# Patient Record
Sex: Male | Born: 1935 | Race: White | Hispanic: No | Marital: Married | Smoking: Former smoker
Health system: Southern US, Community
[De-identification: ages and names within clinical notes are randomized; demographics above are authoritative.]

## PROBLEM LIST (undated history)

## (undated) DIAGNOSIS — I350 Nonrheumatic aortic (valve) stenosis: Secondary | ICD-10-CM

## (undated) DIAGNOSIS — I779 Disorder of arteries and arterioles, unspecified: Secondary | ICD-10-CM

## (undated) DIAGNOSIS — I482 Chronic atrial fibrillation, unspecified: Secondary | ICD-10-CM

## (undated) DIAGNOSIS — IMO0001 Reserved for inherently not codable concepts without codable children: Secondary | ICD-10-CM

## (undated) DIAGNOSIS — C44211 Basal cell carcinoma of skin of unspecified ear and external auricular canal: Secondary | ICD-10-CM

## (undated) DIAGNOSIS — I509 Heart failure, unspecified: Secondary | ICD-10-CM

## (undated) DIAGNOSIS — I714 Abdominal aortic aneurysm, without rupture, unspecified: Secondary | ICD-10-CM

## (undated) DIAGNOSIS — R011 Cardiac murmur, unspecified: Secondary | ICD-10-CM

## (undated) DIAGNOSIS — I4891 Unspecified atrial fibrillation: Secondary | ICD-10-CM

## (undated) DIAGNOSIS — I739 Peripheral vascular disease, unspecified: Secondary | ICD-10-CM

## (undated) DIAGNOSIS — Z5189 Encounter for other specified aftercare: Secondary | ICD-10-CM

## (undated) DIAGNOSIS — E785 Hyperlipidemia, unspecified: Secondary | ICD-10-CM

## (undated) DIAGNOSIS — C189 Malignant neoplasm of colon, unspecified: Secondary | ICD-10-CM

## (undated) DIAGNOSIS — I10 Essential (primary) hypertension: Secondary | ICD-10-CM

## (undated) DIAGNOSIS — T7840XA Allergy, unspecified, initial encounter: Secondary | ICD-10-CM

## (undated) DIAGNOSIS — Z955 Presence of coronary angioplasty implant and graft: Secondary | ICD-10-CM

## (undated) DIAGNOSIS — Z923 Personal history of irradiation: Secondary | ICD-10-CM

## (undated) DIAGNOSIS — H9193 Unspecified hearing loss, bilateral: Secondary | ICD-10-CM

## (undated) DIAGNOSIS — D509 Iron deficiency anemia, unspecified: Secondary | ICD-10-CM

## (undated) DIAGNOSIS — C801 Malignant (primary) neoplasm, unspecified: Secondary | ICD-10-CM

## (undated) DIAGNOSIS — I672 Cerebral atherosclerosis: Secondary | ICD-10-CM

## (undated) DIAGNOSIS — I2510 Atherosclerotic heart disease of native coronary artery without angina pectoris: Secondary | ICD-10-CM

## (undated) DIAGNOSIS — I080 Rheumatic disorders of both mitral and aortic valves: Secondary | ICD-10-CM

## (undated) DIAGNOSIS — I219 Acute myocardial infarction, unspecified: Secondary | ICD-10-CM

## (undated) HISTORY — DX: Hyperlipidemia, unspecified: E78.5

## (undated) HISTORY — DX: Peripheral vascular disease, unspecified: I73.9

## (undated) HISTORY — DX: Encounter for other specified aftercare: Z51.89

## (undated) HISTORY — DX: Essential (primary) hypertension: I10

## (undated) HISTORY — DX: Chronic atrial fibrillation, unspecified: I48.20

## (undated) HISTORY — DX: Unspecified hearing loss, bilateral: H91.93

## (undated) HISTORY — DX: Nonrheumatic aortic (valve) stenosis: I35.0

## (undated) HISTORY — PX: OTHER SURGICAL HISTORY: SHX169

## (undated) HISTORY — DX: Acute myocardial infarction, unspecified: I21.9

## (undated) HISTORY — DX: Abdominal aortic aneurysm, without rupture: I71.4

## (undated) HISTORY — DX: Reserved for inherently not codable concepts without codable children: IMO0001

## (undated) HISTORY — DX: Atherosclerotic heart disease of native coronary artery without angina pectoris: I25.10

## (undated) HISTORY — DX: Malignant (primary) neoplasm, unspecified: C80.1

## (undated) HISTORY — PX: COLON SURGERY: SHX602

## (undated) HISTORY — DX: Presence of coronary angioplasty implant and graft: Z95.5

## (undated) HISTORY — DX: Abdominal aortic aneurysm, without rupture, unspecified: I71.40

## (undated) HISTORY — DX: Cardiac murmur, unspecified: R01.1

## (undated) HISTORY — DX: Unspecified atrial fibrillation: I48.91

## (undated) HISTORY — DX: Cerebral atherosclerosis: I67.2

## (undated) HISTORY — DX: Rheumatic disorders of both mitral and aortic valves: I08.0

## (undated) HISTORY — DX: Disorder of arteries and arterioles, unspecified: I77.9

## (undated) HISTORY — DX: Personal history of irradiation: Z92.3

## (undated) HISTORY — DX: Iron deficiency anemia, unspecified: D50.9

## (undated) HISTORY — DX: Basal cell carcinoma of skin of unspecified ear and external auricular canal: C44.211

---

## 1991-08-17 HISTORY — PX: CARDIAC CATHETERIZATION: SHX172

## 1992-03-29 HISTORY — PX: CARDIAC CATHETERIZATION: SHX172

## 1998-06-02 ENCOUNTER — Ambulatory Visit: Admission: RE | Admit: 1998-06-02 | Discharge: 1998-06-02 | Payer: Self-pay | Admitting: Cardiovascular Disease

## 2000-11-26 HISTORY — PX: CORONARY ARTERY BYPASS GRAFT: SHX141

## 2001-03-11 ENCOUNTER — Encounter: Payer: Self-pay | Admitting: Cardiovascular Disease

## 2001-03-11 HISTORY — PX: CARDIAC CATHETERIZATION: SHX172

## 2001-03-12 ENCOUNTER — Inpatient Hospital Stay (HOSPITAL_COMMUNITY): Admission: RE | Admit: 2001-03-12 | Discharge: 2001-03-20 | Payer: Self-pay | Admitting: Cardiovascular Disease

## 2001-03-14 ENCOUNTER — Encounter: Payer: Self-pay | Admitting: Thoracic Surgery (Cardiothoracic Vascular Surgery)

## 2001-03-15 ENCOUNTER — Encounter: Payer: Self-pay | Admitting: Thoracic Surgery (Cardiothoracic Vascular Surgery)

## 2001-03-16 ENCOUNTER — Encounter: Payer: Self-pay | Admitting: Surgery

## 2001-04-07 ENCOUNTER — Encounter: Payer: Self-pay | Admitting: Cardiovascular Disease

## 2001-04-07 ENCOUNTER — Ambulatory Visit (HOSPITAL_COMMUNITY): Admission: RE | Admit: 2001-04-07 | Discharge: 2001-04-07 | Payer: Self-pay | Admitting: Cardiovascular Disease

## 2001-04-22 ENCOUNTER — Encounter (HOSPITAL_COMMUNITY): Admission: RE | Admit: 2001-04-22 | Discharge: 2001-05-22 | Payer: Self-pay | Admitting: Cardiovascular Disease

## 2001-05-23 ENCOUNTER — Encounter (HOSPITAL_COMMUNITY): Admission: RE | Admit: 2001-05-23 | Discharge: 2001-06-22 | Payer: Self-pay | Admitting: Cardiovascular Disease

## 2001-06-23 ENCOUNTER — Encounter (HOSPITAL_COMMUNITY): Admission: RE | Admit: 2001-06-23 | Discharge: 2001-07-23 | Payer: Self-pay | Admitting: Cardiovascular Disease

## 2002-03-10 ENCOUNTER — Encounter: Payer: Self-pay | Admitting: Cardiovascular Disease

## 2002-03-10 ENCOUNTER — Ambulatory Visit (HOSPITAL_COMMUNITY): Admission: RE | Admit: 2002-03-10 | Discharge: 2002-03-10 | Payer: Self-pay | Admitting: Cardiovascular Disease

## 2002-05-27 ENCOUNTER — Encounter: Payer: Self-pay | Admitting: Cardiovascular Disease

## 2002-05-27 ENCOUNTER — Ambulatory Visit (HOSPITAL_COMMUNITY): Admission: RE | Admit: 2002-05-27 | Discharge: 2002-05-27 | Payer: Self-pay | Admitting: Cardiovascular Disease

## 2002-05-27 HISTORY — PX: OTHER SURGICAL HISTORY: SHX169

## 2003-11-27 HISTORY — PX: OTHER SURGICAL HISTORY: SHX169

## 2004-03-15 ENCOUNTER — Emergency Department (HOSPITAL_COMMUNITY): Admission: EM | Admit: 2004-03-15 | Discharge: 2004-03-15 | Payer: Self-pay | Admitting: Emergency Medicine

## 2004-03-24 ENCOUNTER — Ambulatory Visit (HOSPITAL_COMMUNITY): Admission: RE | Admit: 2004-03-24 | Discharge: 2004-03-24 | Payer: Self-pay | Admitting: Family Medicine

## 2004-04-07 ENCOUNTER — Ambulatory Visit (HOSPITAL_COMMUNITY): Admission: RE | Admit: 2004-04-07 | Discharge: 2004-04-07 | Payer: Self-pay | Admitting: Family Medicine

## 2004-08-18 ENCOUNTER — Ambulatory Visit (HOSPITAL_COMMUNITY): Admission: RE | Admit: 2004-08-18 | Discharge: 2004-08-18 | Payer: Self-pay | Admitting: General Surgery

## 2004-08-21 ENCOUNTER — Inpatient Hospital Stay (HOSPITAL_COMMUNITY): Admission: EM | Admit: 2004-08-21 | Discharge: 2004-08-23 | Payer: Self-pay | Admitting: Emergency Medicine

## 2004-08-21 ENCOUNTER — Ambulatory Visit: Payer: Self-pay | Admitting: Internal Medicine

## 2004-08-22 ENCOUNTER — Encounter (INDEPENDENT_AMBULATORY_CARE_PROVIDER_SITE_OTHER): Payer: Self-pay | Admitting: Cardiology

## 2004-08-28 ENCOUNTER — Ambulatory Visit: Payer: Self-pay | Admitting: Internal Medicine

## 2004-09-18 ENCOUNTER — Inpatient Hospital Stay (HOSPITAL_COMMUNITY): Admission: RE | Admit: 2004-09-18 | Discharge: 2004-09-21 | Payer: Self-pay | Admitting: Cardiovascular Disease

## 2004-11-26 HISTORY — PX: CAROTID ENDARTERECTOMY: SUR193

## 2004-11-26 HISTORY — PX: OTHER SURGICAL HISTORY: SHX169

## 2004-11-28 ENCOUNTER — Ambulatory Visit: Payer: Self-pay | Admitting: Oncology

## 2004-12-05 ENCOUNTER — Ambulatory Visit (HOSPITAL_COMMUNITY): Admission: RE | Admit: 2004-12-05 | Discharge: 2004-12-05 | Payer: Self-pay | Admitting: Internal Medicine

## 2004-12-05 ENCOUNTER — Ambulatory Visit: Payer: Self-pay | Admitting: Gastroenterology

## 2005-01-24 HISTORY — PX: COLONOSCOPY: SHX174

## 2005-01-24 HISTORY — PX: ESOPHAGOGASTRODUODENOSCOPY: SHX1529

## 2005-01-31 ENCOUNTER — Ambulatory Visit: Payer: Self-pay | Admitting: Gastroenterology

## 2005-01-31 ENCOUNTER — Inpatient Hospital Stay (HOSPITAL_COMMUNITY): Admission: EM | Admit: 2005-01-31 | Discharge: 2005-02-01 | Payer: Self-pay | Admitting: Emergency Medicine

## 2005-02-21 ENCOUNTER — Inpatient Hospital Stay (HOSPITAL_COMMUNITY): Admission: EM | Admit: 2005-02-21 | Discharge: 2005-02-24 | Payer: Self-pay | Admitting: Emergency Medicine

## 2005-04-09 ENCOUNTER — Ambulatory Visit (HOSPITAL_COMMUNITY): Admission: RE | Admit: 2005-04-09 | Discharge: 2005-04-10 | Payer: Self-pay | Admitting: Cardiovascular Disease

## 2005-05-17 ENCOUNTER — Encounter (INDEPENDENT_AMBULATORY_CARE_PROVIDER_SITE_OTHER): Payer: Self-pay | Admitting: Specialist

## 2005-05-17 ENCOUNTER — Inpatient Hospital Stay (HOSPITAL_COMMUNITY): Admission: RE | Admit: 2005-05-17 | Discharge: 2005-05-18 | Payer: Self-pay | Admitting: *Deleted

## 2009-06-27 ENCOUNTER — Encounter (INDEPENDENT_AMBULATORY_CARE_PROVIDER_SITE_OTHER): Payer: Self-pay | Admitting: Ophthalmology

## 2009-06-27 ENCOUNTER — Ambulatory Visit (HOSPITAL_COMMUNITY): Admission: RE | Admit: 2009-06-27 | Discharge: 2009-06-27 | Payer: Self-pay | Admitting: Ophthalmology

## 2009-10-07 ENCOUNTER — Encounter: Payer: Self-pay | Admitting: Internal Medicine

## 2010-02-13 HISTORY — PX: CARDIOVASCULAR STRESS TEST: SHX262

## 2011-02-07 ENCOUNTER — Encounter: Payer: Self-pay | Admitting: Gastroenterology

## 2011-02-07 ENCOUNTER — Ambulatory Visit (INDEPENDENT_AMBULATORY_CARE_PROVIDER_SITE_OTHER): Payer: Medicare Other | Admitting: Gastroenterology

## 2011-02-07 DIAGNOSIS — K5900 Constipation, unspecified: Secondary | ICD-10-CM | POA: Insufficient documentation

## 2011-02-07 DIAGNOSIS — Z862 Personal history of diseases of the blood and blood-forming organs and certain disorders involving the immune mechanism: Secondary | ICD-10-CM

## 2011-02-09 ENCOUNTER — Encounter: Payer: Self-pay | Admitting: Gastroenterology

## 2011-02-13 NOTE — Letter (Signed)
Summary: [facility]  [facility]   Imported By: Rexene Alberts 02/09/2011 10:00:30  _____________________________________________________________________  External Attachment:    Type:   Image     Comment:   External Document

## 2011-02-13 NOTE — Assessment & Plan Note (Signed)
Summary: ABD PAIN/GAS & BLOATING/LAW   Vital Signs:  Patient profile:   77 year old male Height:      68 inches Weight:      144 pounds BMI:     21.97 Temp:     98.3 degrees F oral Pulse rate:   88 / minute Pulse rhythm:   irregular BP sitting:   100 / 58  (left arm)  Vitals Entered By: Carolan Clines LPN (February 07, 2011 10:46 AM)  Visit Type:  f/u Primary Care Provider:  [facility]  Chief Complaint:  constipation.  History of Present Illness: Mr. Friesen is a pleasant 77 y/o WM who presents for evaluation of constipation. He states he has increased constipation every fall when the weather gets cool. He may go 3-4 days at a time without a BM. He called to make appt yesterday b/c of abd bloating/pain/obstipation. Took pediatric gylcerin suppository at 5pm yeasterday and at 2:30am he had large volume BMs. No brbpr. Stools are black on chronic iron. Pain resolved after BM. He has three episodes this winter. Denies rectal pain. Has used hemorrhoid suppositories in past. Symptoms this time seemed to start after falling three weeks ago. Seen by [facility]. Had even monitor done. Labs done 01/23/11 showed H/H 12.2/38.6.   Last required prbc and iron infusion 2006. See PMH.     Current Medications (verified): 1)  Zetia 10 Mg Tabs (Ezetimibe) .... Take One Once Daily 2)  Hydrochlorothiazide 25 Mg Tabs (Hydrochlorothiazide) .... Take One Once Daily 3)  Isosorbide Mononitrate Cr 60 Mg Xr24h-Tab (Isosorbide Mononitrate) .... Take One Half Once Daily 4)  Lipitor 40 Mg Tabs (Atorvastatin Calcium) .... Take One Once Daily 5)  Plavix 75 Mg Tabs (Clopidogrel Bisulfate) .... Take One Once Daily 6)  Niaspan 500 Mg Cr-Tabs (Niacin (Antihyperlipidemic)) .... Take Three Once Daily At Bed 7)  Ferrous Sulfate 325 (65 Fe) Mg Tabs (Ferrous Sulfate) .... Take One Three Times A Day 8)  Prevacid 15 Mg Cpdr (Lansoprazole) .... Take One Once Daily 9)  Tenormin 50 Mg Tabs (Atenolol) ....  Take One Once Daily 10)  Multivitamins  Tabs (Multiple Vitamin) .... Take One Once Daily 11)  Pedia-Lax 1 Gm Supp (Glycerin (Laxative)) .... Use One Prn  Allergies (verified): 1)  ! Lopressor  Past History:  Past Medical History: *SB capsule endoscopy 1/06, ?nonbleeding leiomyomas in distal small bowel.   *TCS with TI by Dr. Jena Gauss 3/06 for IDA, recurrent significant GI bleeding on Plavix. Int hemorrhoids, normal colon and TI, felt to have bled from occult AVMs and discontinuation of Plavix/ASA advised at that time due to risk/benefits ratio.  *EGD with enteroscopy for abnormal SB capsule endoscopy in setting of IDA/Plavix/ASA, 3/06-->normal through to proximal jejunum. CAD PAD Moderate aortic stenosis Hyperlipidemia Small AAA, followed by Dr. [name] Afib, not candidate for Coumadin    Past Surgical History: Skin cancer CABG 2002 Right CEA, 2006 Stent RCA ostium, 2005  Family History: No FH of chronic GI illnesses, CRC, liver disease.  Social History: Married. No children. Quit smoking, remotely. No alcohol, drug use.  Review of Systems General:  Denies fever, chills, sweats, anorexia, fatigue, weakness, and weight loss. Eyes:  Denies vision loss. ENT:  Denies nasal congestion, sore throat, hoarseness, and difficulty swallowing. CV:  Complains of dyspnea on exertion and peripheral edema; denies chest pains, angina, and palpitations. Resp:  Complains of dyspnea with exercise; denies dyspnea at rest, cough, sputum, and wheezing. GI:  See HPI. GU:  Denies urinary  burning and blood in urine. MS:  Denies joint pain / LOM and low back pain. Derm:  Denies rash and itching. Neuro:  Denies weakness, frequent headaches, memory loss, and confusion. Psych:  Denies depression and anxiety. Endo:  Denies unusual weight change. Heme:  Denies bruising and bleeding. Allergy:  Denies hives and rash.  Physical Exam  General:  Well developed, well nourished, no acute distress.  Elderly. Head:  Normocephalic and atraumatic. Eyes:    no scleral icterus.  Mouth:  Oropharyngeal mucosa moist, pink.  No lesions, erythema or exudate.    Neck:  Supple; no masses or thyromegaly. Lungs:  Clear throughout to auscultation. Heart:  irregular rhythm:.   Abdomen:  Bowel sounds normal.  Abdomen is soft, nontender, nondistended.  No rebound or guarding.  No hepatosplenomegaly, masses or hernias.  No abdominal bruits.  Extremities:  trace pedal edema.   Neurologic:  Alert and  oriented x4;  grossly normal neurologically. Skin:  Intact without significant lesions or rashes. Cervical Nodes:  No significant cervical adenopathy. Psych:  Alert and cooperative. Normal mood and affect.   Impression & Recommendations:  Problem # 1:  CONSTIPATION, INTERMITTENT (ICD-564.00)  Intermittent constipation without alarm symptoms. Last TCS 2006. Begin Miralax. OV in 3 months.  Orders: New Patient Level IV (99204)Future Orders: T-CBC w/Diff ([id number]) ... 04/02/2011  Problem # 2:  IRON DEFICIENCY ANEMIA, HX OF (ICD-V12.3)  Tried to get previous h/h from PCP or [facility] for comparison but have been unsuccessful. Overall Hgb near normal. As long as H/H good he can wait for TCS until 2016, health permitting. Would advise monitoring H/H given h/o occult GI bleeding on Plavix/ASA.  Orders: New Patient Level IV (99204)Future Orders: T-CBC w/Diff ([id number]) ... 04/02/2011  Patient Instructions: 1)  Begin Miralax one capful daily as needed for constipation. Take daily for one week to establish regular bowel movements and then you can decrease to 2-3 times per week.  2)  We will review your labs when available and call with any further instruction. 3)  Please schedule a follow-up appointment in 3 months. 4)  The medication list was reviewed and reconciled.  All changed / newly prescribed medications were explained.  A complete medication list was provided to the patient /  caregiver. Prescriptions: POLYETHYLENE GLYCOL 3350  POWD (POLYETHYLENE GLYCOL 3350) 17 grams by mouth daily as needed constipation  #527g x 5   Entered and Authorized by:   Leanna Battles. Dixon Boos   Signed by:   Leanna Battles TRUE Garciamartinez PA-C on 02/07/2011   Method used:   Print then Give to Patient   RxID:   [id number]    Orders Added: 1)  New Patient Level IV [99204] 2)  T-CBC w/Diff [[id number]]  Appended Document: ABD PAIN/GAS & BLOATING/LAW 3 month f/u opv is in the computer

## 2011-02-28 ENCOUNTER — Telehealth: Payer: Self-pay

## 2011-02-28 NOTE — Telephone Encounter (Signed)
pts wife aware, she stated she wants to try the colace and will call back to reschedule ov.

## 2011-02-28 NOTE — Telephone Encounter (Signed)
Patient can hold MiraLax for now. Try Colace 1-2 tablets at bedtime. Stomach burning is a new problem. Recommend office visit now.

## 2011-02-28 NOTE — Telephone Encounter (Signed)
pts wife called- was seen by [facility] on 02/07/11 and given rx for miralax. Pt has been taking qhs and having a lot of abd cramping that is keeping him up all night. Wife stated he was having good BMs. Pt is also complaining of his stomach burning all the time now. He is on prevacid and plavix. Wife stated she was having a hard time trying to get him to eat now. Pt has appt 05/11/11 and they want to know if there is anything we can call in to help him until his ov or does he need to come in sooner? Pt uses [facility]. Please advise.

## 2011-03-04 LAB — HEMOGLOBIN AND HEMATOCRIT, BLOOD: HCT: 37.4 % — ABNORMAL LOW (ref 39.0–52.0)

## 2011-03-04 LAB — BASIC METABOLIC PANEL
Calcium: 9.7 mg/dL (ref 8.4–10.5)
Creatinine, Ser: 0.62 mg/dL (ref 0.4–1.5)
Potassium: 4.4 mEq/L (ref 3.5–5.1)
Sodium: 139 mEq/L (ref 135–145)

## 2011-03-13 ENCOUNTER — Ambulatory Visit (INDEPENDENT_AMBULATORY_CARE_PROVIDER_SITE_OTHER): Payer: Medicare Other | Admitting: Gastroenterology

## 2011-03-13 ENCOUNTER — Encounter: Payer: Self-pay | Admitting: Gastroenterology

## 2011-03-13 DIAGNOSIS — R634 Abnormal weight loss: Secondary | ICD-10-CM | POA: Insufficient documentation

## 2011-03-13 DIAGNOSIS — R630 Anorexia: Secondary | ICD-10-CM | POA: Insufficient documentation

## 2011-03-13 DIAGNOSIS — R109 Unspecified abdominal pain: Secondary | ICD-10-CM | POA: Insufficient documentation

## 2011-03-13 DIAGNOSIS — Z862 Personal history of diseases of the blood and blood-forming organs and certain disorders involving the immune mechanism: Secondary | ICD-10-CM

## 2011-03-13 DIAGNOSIS — K5900 Constipation, unspecified: Secondary | ICD-10-CM

## 2011-03-13 NOTE — Progress Notes (Signed)
Primary Care Physician: [name]., MD  Primary Gastroenterologist:  Dr. Roetta Sessions  Chief Complaint  Patient presents with  . Abdominal Pain    HPI: Charles Faulkner is a 77 y.o. male here for further evaluation of abdominal pain. He was seen last month with complaints of constipation. Since last office visit he started having abdominal pain. His bowel movements have been daily now that he is on Colace 200 mg at bedtime. He complains of abdominal cramping and gas. Not associated with bowel movements. He is single episode of vomiting. Complains of anorexia. He's lost 2 pounds in the past month. Denies any heartburn or indigestion. No dysphagia. No melena or rectal bleeding. Denies fever or chills. According to his wife, he feels terrible and want do anything like he used to.  *SB capsule endoscopy 1/06, ?nonbleeding leiomyomas in distal small bowel.   *TCS with TI by Dr. Jena Gauss 3/06 for IDA, recurrent significant GI bleeding on Plavix. Int hemorrhoids, normal colon and TI, felt to have bled from occult AVMs and discontinuation of Plavix/ASA advised at that time due to risk/benefits ratio.  *EGD with enteroscopy for abnormal SB capsule endoscopy in setting of IDA/Plavix/ASA, 3/06-->normal through to proximal jejunum.  Last hemoglobin in the 12 range a couple months ago.  Current Outpatient Prescriptions  Medication Sig Dispense Refill  . clopidogrel (PLAVIX) 75 MG tablet Take 75 mg by mouth daily.        Marland Kitchen docusate sodium (COLACE) 100 MG capsule Take 100 mg by mouth daily.        . ferrous sulfate 325 (65 FE) MG tablet Take 325 mg by mouth daily with breakfast.        . hydrochlorothiazide 25 MG tablet Take 0.5 tablets by mouth daily.      . lansoprazole (PREVACID) 15 MG capsule Take 15 mg by mouth daily.        Marland Kitchen LIPITOR 40 MG tablet Take 1 tablet by mouth daily.      . Multiple Vitamins-Minerals ([facility] SPECTRAVITE SENIOR PO) Take 1 tablet by mouth daily.        . niacin (NIASPAN) 500  MG CR tablet Take 500 mg by mouth at bedtime.        . TENORMIN 50 MG tablet Take 1 tablet by mouth daily.      Marland Kitchen ZETIA 10 MG tablet Take 1 tablet by mouth daily.        Allergies as of 03/13/2011 - Review Complete 03/13/2011  Allergen Reaction Noted  . Metoprolol tartrate  02/07/2011    ROS:  General: Negative for fever, chills. C/O weakness. See HPI. ENT: Negative for hoarseness, difficulty swallowing , nasal congestion. CV: Negative for chest pain, angina, palpitations, dyspnea on exertion, peripheral edema.  Respiratory: Negative for dyspnea at rest, dyspnea on exertion, cough, sputum, wheezing.  GI: See history of present illness. GU:  Negative for dysuria, hematuria, urinary incontinence, urinary frequency, nocturnal urination.  Endo: Negative for unusual weight change.    Physical Examination:   BP 104/64  Pulse 98  Temp 98.9 F (37.2 C)  Ht 5\' 4"  (1.626 m)  Wt 142 lb 6.4 oz (64.592 kg)  BMI 24.44 kg/m2  General: Well-nourished, well-developed in no acute distress. Extreme hard of hearing. Eyes: No icterus. Mouth: Oropharyngeal mucosa moist and pink , no lesions erythema or exudate. Lungs: Clear to auscultation bilaterally.  Heart: Regular rate and rhythm, no murmurs rubs or gallops.  Abdomen: Bowel sounds are normal, diffuse mild tenderness, nondistended, no  hepatosplenomegaly or masses, no abdominal bruits or hernia , no rebound or guarding.   Extremities: No lower extremity edema.  Neuro: Alert and oriented x 4   Skin: Warm and dry, no jaundice.   Psych: Alert and cooperative, normal mood and affect.

## 2011-03-13 NOTE — Telephone Encounter (Signed)
Pt had OV on 03/13/11

## 2011-03-14 ENCOUNTER — Encounter: Payer: Self-pay | Admitting: Gastroenterology

## 2011-03-14 ENCOUNTER — Emergency Department (HOSPITAL_COMMUNITY): Payer: Medicare Other

## 2011-03-14 ENCOUNTER — Ambulatory Visit (HOSPITAL_COMMUNITY)
Admission: RE | Admit: 2011-03-14 | Discharge: 2011-03-14 | Disposition: A | Discharge status: Home / Self Care | Payer: Medicare Other | Source: Ambulatory Visit | Attending: Gastroenterology | Admitting: Gastroenterology

## 2011-03-14 ENCOUNTER — Inpatient Hospital Stay (HOSPITAL_COMMUNITY)
Admission: EM | Admit: 2011-03-14 | Discharge: 2011-03-16 | Disposition: A | Discharge status: Home Health | Payer: Medicare Other | Source: Home / Self Care | Attending: General Surgery | Admitting: General Surgery

## 2011-03-14 DIAGNOSIS — I4891 Unspecified atrial fibrillation: Secondary | ICD-10-CM | POA: Diagnosis present

## 2011-03-14 DIAGNOSIS — K639 Disease of intestine, unspecified: Secondary | ICD-10-CM | POA: Insufficient documentation

## 2011-03-14 DIAGNOSIS — Y836 Removal of other organ (partial) (total) as the cause of abnormal reaction of the patient, or of later complication, without mention of misadventure at the time of the procedure: Secondary | ICD-10-CM | POA: Diagnosis not present

## 2011-03-14 DIAGNOSIS — R188 Other ascites: Secondary | ICD-10-CM | POA: Insufficient documentation

## 2011-03-14 DIAGNOSIS — I509 Heart failure, unspecified: Secondary | ICD-10-CM | POA: Diagnosis present

## 2011-03-14 DIAGNOSIS — I714 Abdominal aortic aneurysm, without rupture, unspecified: Secondary | ICD-10-CM | POA: Diagnosis present

## 2011-03-14 DIAGNOSIS — I5031 Acute diastolic (congestive) heart failure: Secondary | ICD-10-CM | POA: Diagnosis not present

## 2011-03-14 DIAGNOSIS — K631 Perforation of intestine (nontraumatic): Secondary | ICD-10-CM | POA: Diagnosis present

## 2011-03-14 DIAGNOSIS — E785 Hyperlipidemia, unspecified: Secondary | ICD-10-CM | POA: Diagnosis present

## 2011-03-14 DIAGNOSIS — D72829 Elevated white blood cell count, unspecified: Secondary | ICD-10-CM | POA: Diagnosis present

## 2011-03-14 DIAGNOSIS — C189 Malignant neoplasm of colon, unspecified: Secondary | ICD-10-CM | POA: Diagnosis present

## 2011-03-14 DIAGNOSIS — Z7902 Long term (current) use of antithrombotics/antiplatelets: Secondary | ICD-10-CM

## 2011-03-14 DIAGNOSIS — R630 Anorexia: Secondary | ICD-10-CM

## 2011-03-14 DIAGNOSIS — Z79899 Other long term (current) drug therapy: Secondary | ICD-10-CM

## 2011-03-14 DIAGNOSIS — R634 Abnormal weight loss: Secondary | ICD-10-CM | POA: Insufficient documentation

## 2011-03-14 DIAGNOSIS — I2510 Atherosclerotic heart disease of native coronary artery without angina pectoris: Secondary | ICD-10-CM | POA: Diagnosis present

## 2011-03-14 DIAGNOSIS — D649 Anemia, unspecified: Secondary | ICD-10-CM | POA: Diagnosis present

## 2011-03-14 DIAGNOSIS — I10 Essential (primary) hypertension: Secondary | ICD-10-CM | POA: Diagnosis present

## 2011-03-14 DIAGNOSIS — R109 Unspecified abdominal pain: Secondary | ICD-10-CM | POA: Insufficient documentation

## 2011-03-14 DIAGNOSIS — R5381 Other malaise: Secondary | ICD-10-CM | POA: Diagnosis not present

## 2011-03-14 DIAGNOSIS — C772 Secondary and unspecified malignant neoplasm of intra-abdominal lymph nodes: Secondary | ICD-10-CM | POA: Diagnosis present

## 2011-03-14 DIAGNOSIS — I359 Nonrheumatic aortic valve disorder, unspecified: Secondary | ICD-10-CM | POA: Diagnosis present

## 2011-03-14 DIAGNOSIS — Z951 Presence of aortocoronary bypass graft: Secondary | ICD-10-CM

## 2011-03-14 DIAGNOSIS — J449 Chronic obstructive pulmonary disease, unspecified: Secondary | ICD-10-CM | POA: Diagnosis present

## 2011-03-14 DIAGNOSIS — J4489 Other specified chronic obstructive pulmonary disease: Secondary | ICD-10-CM | POA: Diagnosis present

## 2011-03-14 DIAGNOSIS — R935 Abnormal findings on diagnostic imaging of other abdominal regions, including retroperitoneum: Secondary | ICD-10-CM | POA: Insufficient documentation

## 2011-03-14 DIAGNOSIS — Y9210 Unspecified residential institution as the place of occurrence of the external cause: Secondary | ICD-10-CM | POA: Diagnosis not present

## 2011-03-14 DIAGNOSIS — I6529 Occlusion and stenosis of unspecified carotid artery: Secondary | ICD-10-CM | POA: Diagnosis present

## 2011-03-14 DIAGNOSIS — Z87891 Personal history of nicotine dependence: Secondary | ICD-10-CM

## 2011-03-14 DIAGNOSIS — K869 Disease of pancreas, unspecified: Secondary | ICD-10-CM | POA: Insufficient documentation

## 2011-03-14 DIAGNOSIS — T8140XA Infection following a procedure, unspecified, initial encounter: Secondary | ICD-10-CM | POA: Diagnosis not present

## 2011-03-14 DIAGNOSIS — I739 Peripheral vascular disease, unspecified: Secondary | ICD-10-CM | POA: Diagnosis present

## 2011-03-14 DIAGNOSIS — Z9861 Coronary angioplasty status: Secondary | ICD-10-CM

## 2011-03-14 DIAGNOSIS — F29 Unspecified psychosis not due to a substance or known physiological condition: Secondary | ICD-10-CM | POA: Diagnosis not present

## 2011-03-14 DIAGNOSIS — C180 Malignant neoplasm of cecum: Principal | ICD-10-CM | POA: Diagnosis present

## 2011-03-14 DIAGNOSIS — C786 Secondary malignant neoplasm of retroperitoneum and peritoneum: Secondary | ICD-10-CM | POA: Diagnosis present

## 2011-03-14 DIAGNOSIS — F05 Delirium due to known physiological condition: Secondary | ICD-10-CM | POA: Diagnosis not present

## 2011-03-14 DIAGNOSIS — Z832 Family history of diseases of the blood and blood-forming organs and certain disorders involving the immune mechanism: Secondary | ICD-10-CM

## 2011-03-14 LAB — DIFFERENTIAL
Eosinophils Relative: 1 % (ref 0–5)
Lymphs Abs: 1.1 10*3/uL (ref 0.7–4.0)
Monocytes Absolute: 1 10*3/uL (ref 0.1–1.0)
Monocytes Relative: 9 % (ref 3–12)
Neutro Abs: 9.4 10*3/uL — ABNORMAL HIGH (ref 1.7–7.7)

## 2011-03-14 LAB — CBC WITH DIFFERENTIAL/PLATELET
Basophils Relative: 0 % (ref 0–1)
HCT: 40.2 % (ref 39.0–52.0)
MCH: 30.9 pg (ref 26.0–34.0)
MCV: 96.4 fL (ref 78.0–100.0)
Monocytes Absolute: 1 10*3/uL (ref 0.1–1.0)
Monocytes Relative: 10 % (ref 3–12)
Neutrophils Relative %: 76 % (ref 43–77)
RBC: 4.17 MIL/uL — ABNORMAL LOW (ref 4.22–5.81)

## 2011-03-14 LAB — COMPREHENSIVE METABOLIC PANEL
Albumin: 2.4 g/dL — ABNORMAL LOW (ref 3.5–5.2)
Alkaline Phosphatase: 84 U/L (ref 39–117)
Alkaline Phosphatase: 86 U/L (ref 39–117)
BUN: 11 mg/dL (ref 6–23)
CO2: 24 mEq/L (ref 19–32)
Calcium: 9 mg/dL (ref 8.4–10.5)
Chloride: 102 mEq/L (ref 96–112)
Creat: 0.7 mg/dL (ref 0.40–1.50)
Creatinine, Ser: 0.93 mg/dL (ref 0.4–1.5)
Glucose, Bld: 171 mg/dL — ABNORMAL HIGH (ref 70–99)
Sodium: 141 mEq/L (ref 135–145)
Total Bilirubin: 0.5 mg/dL (ref 0.3–1.2)
Total Protein: 5.9 g/dL — ABNORMAL LOW (ref 6.0–8.3)

## 2011-03-14 LAB — CBC
Hemoglobin: 12.5 g/dL — ABNORMAL LOW (ref 13.0–17.0)
MCHC: 33.4 g/dL (ref 30.0–36.0)
MCV: 94 fL (ref 78.0–100.0)
Platelets: 272 10*3/uL (ref 150–400)
RDW: 15.5 % (ref 11.5–15.5)
WBC: 11.6 10*3/uL — ABNORMAL HIGH (ref 4.0–10.5)

## 2011-03-14 MED ORDER — IOHEXOL 300 MG/ML  SOLN
100.0000 mL | Freq: Once | INTRAMUSCULAR | Status: AC | PRN
  Administered 2011-03-14: 100 mL via INTRAVENOUS

## 2011-03-14 NOTE — Assessment & Plan Note (Signed)
Improved on Colace.

## 2011-03-14 NOTE — Assessment & Plan Note (Signed)
Refer to abdominal pain assessment and plan.

## 2011-03-14 NOTE — Assessment & Plan Note (Signed)
Refer to abdominal pain assessment and plan. 

## 2011-03-14 NOTE — Assessment & Plan Note (Signed)
New onset abdominal pain. He has developed this since his last office visit. The only change in medications that we made with the added Colace 200 mg at bedtime. This has resulted in regular bowel movements now. Abdominal pain is associated with anorexia, 2 pound weight loss, episode of vomiting. Etiology unclear. Doubt it is related to Colace. Pain is located throughout the lower to mid abdomen. Obtain CT abdomen and pelvis with IV and oral contrast. Check labs.

## 2011-03-15 LAB — MAGNESIUM: Magnesium: 2 mg/dL (ref 1.5–2.5)

## 2011-03-15 LAB — BASIC METABOLIC PANEL
BUN: 7 mg/dL (ref 6–23)
CO2: 27 mEq/L (ref 19–32)
Creatinine, Ser: 0.66 mg/dL (ref 0.4–1.5)
GFR calc Af Amer: >60 mL/min (ref >60)
Glucose, Bld: 148 mg/dL — ABNORMAL HIGH (ref 70–99)
Potassium: 4 mEq/L (ref 3.5–5.1)
Sodium: 140 mEq/L (ref 135–145)

## 2011-03-15 LAB — DIFFERENTIAL
Eosinophils Absolute: 0.1 10*3/uL (ref 0.0–0.7)
Eosinophils Relative: 1 % (ref 0–5)

## 2011-03-15 LAB — CBC
HCT: 40 % (ref 39.0–52.0)
Hemoglobin: 13.1 g/dL (ref 13.0–17.0)
MCV: 95 fL (ref 78.0–100.0)
RDW: 15.5 % (ref 11.5–15.5)
WBC: 9.3 10*3/uL (ref 4.0–10.5)

## 2011-03-15 LAB — PHOSPHORUS: Phosphorus: 2.2 mg/dL — ABNORMAL LOW (ref 2.3–4.6)

## 2011-03-15 LAB — CEA: CEA: 4.4 ng/mL (ref 0.0–5.0)

## 2011-03-15 NOTE — Progress Notes (Signed)
Cc to Dr. Fusco 

## 2011-03-16 ENCOUNTER — Inpatient Hospital Stay (HOSPITAL_COMMUNITY): Payer: Medicare Other

## 2011-03-16 ENCOUNTER — Inpatient Hospital Stay (HOSPITAL_COMMUNITY)
Admission: AD | Admit: 2011-03-16 | Discharge: 2011-04-02 | DRG: 329 | Disposition: A | Discharge status: Home Health | Payer: Medicare Other | Source: Other Acute Inpatient Hospital | Attending: Surgery | Admitting: Surgery

## 2011-03-16 LAB — BASIC METABOLIC PANEL
BUN: 4 mg/dL — ABNORMAL LOW (ref 6–23)
CO2: 25 mEq/L (ref 19–32)
GFR calc non Af Amer: >60 mL/min (ref >60)
Glucose, Bld: 114 mg/dL — ABNORMAL HIGH (ref 70–99)
Potassium: 4.1 mEq/L (ref 3.5–5.1)
Sodium: 138 mEq/L (ref 135–145)

## 2011-03-16 NOTE — H&P (Signed)
NAME:  Charles Faulkner, NEESON NO.:  [id number]  MEDICAL RECORD NO.:  [id number]           PATIENT TYPE:  I  LOCATION:  A314                          FACILITY:  [facility]  PHYSICIAN:  Dalia Heading, M.D.  DATE OF BIRTH:  10/30/1926  DATE OF ADMISSION:  03/14/2011 DATE OF DISCHARGE:  LH                             HISTORY & PHYSICAL   CHIEF COMPLAINT:  Perforated cecal mass.  HISTORY OF PRESENT ILLNESS:  The patient is an 77 year old white male who presented to Dr. Jena Gauss of Gastroenterology, yesterday as an outpatient complaining of lower abdominal pain.  A CT scan was ordered as an outpatient and today the CT scan showed a probable cecal mass with a contained area of perforation.  The patient was contacted at home to come to the emergency room for further evaluation and treatment.  The patient denies any nausea or vomiting.  He states he has had intermittent bloody stools over the past few months, though this has been a problem with him for many years.  He last underwent a colonoscopy in 2006, by Dr. Jena Gauss, which was unremarkable.  I had done a colonoscopy in 2005, which was also unremarkable.  PAST MEDICAL HISTORY:  Coronary artery disease, peripheral vascular disease, high cholesterol levels, hypertension, history of anemia.  PAST SURGICAL HISTORY:  Right carotid endarterectomy, CABG.  CURRENT MEDICATIONS: 1. Plavix 75 mg p.o. daily. 2. Colace 100 mg p.o. daily. 3. Ferrous sulfate 325 mg p.o. daily. 4. Hydrochlorothiazide 12.5 mg p.o. daily. 5. Prevacid 15 mg p.o. daily. 6. Lipitor 40 mg p.o. daily. 7. Niacin 500 mg p.o. bedtime. 8. Tenormin 50 mg p.o. daily. 9. Zetia 10 mg p.o. daily  ALLERGIES:  LOPRESSOR.  REVIEW OF SYSTEMS:  The patient does not drink, smoke, or use illicit drugs.  He denies any recent chest pain, MI, CVA, or diabetes mellitus. He does have a history of aortic stenosis which has been followed by Dr. [name] of [facility].  FAMILY MEDICAL HISTORY:  There is no family medical history of colon cancer.  PHYSICAL EXAMINATION:  GENERAL:  The patient is a well-developed, well- nourished white male in no acute distress.  He is afebrile. VITAL SIGNS;  Blood pressure 100/56, pulse 110, respirations 20, pulse oximetry 98% on room air. HEENT:  Unremarkable. NECK:  Supple with barely palpable left carotid pulse and a right carotid pulse.  No bruits are noted.  No lymphadenopathy is noted. LUNGS:  Clear to auscultation with equal breath sounds bilaterally. HEART:  Reveals an irregularly irregular with rhythm with a 4/6 systolic ejection murmur. ABDOMEN:  Soft with a faintly palpable mass in the right lower quadrant. It is only slightly tender to touch.  There is no rigidity present. Bilateral shotty lymphadenopathy is noted in the inguinal region.  No hernias are noted. RECTAL:  Deferred at this time.  White blood cell count 11.6, hemoglobin 12.5, hematocrit 37.4, platelet count 272.  MET-7 is remarkable for potassium of 3.1, BUN 10, creatinine 0.93.  Liver enzyme tests are within normal limits.  A CEA level is pending.  Albumin is 2.4.  Chest x-ray reveals  no acute cardiopulmonary process noted.  IMPRESSION: 1. Cecal mass with contained perforation, question primary colon mass     versus appendiceal neoplasm or a process in the terminal ileum. 2. History of aortic stenosis. 3. Hypertension. 4. History of peripheral vascular disease. 5. History of high cholesterol levels.  PLAN:  The patient will be admitted to the hospital and started on clear liquid diet and Invanz  1 g IV q.24 hours.  A consultation from [facility] will be done in the morning to determine whether or not he is a candidate to undergo surgery at [facility] or subsequently be transferred to [facility] for further management and treatment.     Dalia Heading, M.D.     MAJ/MEDQ  D:  03/14/2011  T:  03/15/2011   Job:  [id number]  cc:   Nanetta Batty, M.D. Fax: [phone]  [facility]  Madelin Rear. Sherwood Gambler, MD Fax: [phone]  Electronically Signed by Franky Macho M.D. on 03/16/2011 09:19:36 AM

## 2011-03-16 NOTE — Discharge Summary (Signed)
  NAME:  DAYVEON, HALLEY NO.:  [id number]  MEDICAL RECORD NO.:  [id number]           PATIENT TYPE:  I  LOCATION:  A314                          FACILITY:  [facility]  PHYSICIAN:  Dalia Heading, M.D.  DATE OF BIRTH:  [birth year]  DATE OF ADMISSION:  03/14/2011 DATE OF DISCHARGE:  LH                              DISCHARGE SUMMARY   HOSPITAL COURSE SUMMARY:  The patient is an 77 year old white male who had presented to Dr. Luvenia Starch office of Gastroenterology for right lower quadrant abdominal pain.  He had an outpatient CT scan done yesterday, which revealed a probable cecal mass with a contained area of perforation.  The patient was contacted at home and presented to the emergency room for further evaluation and treatment.  I saw the patient in the emergency room and admitted the patient for further evaluation and treatment.  The patient has a significant history of peripheral vascular disease, coronary artery disease, and aortic stenosis.  In discussion with Dr. [name] of Cardiology, he stated that he probably needs transferred to [facility] for more intensive perioperative care, then can be offered at [facility].  I discussed the situation with Dr. Wenda Low of General Surgery who has agreed to accept the patient on the General Surgery Service.  They will consults [facility] once the patient is at [facility].  I discussed the situation with the patient's family, who understand and agree to the transfer.  Labs and CT scan results are in the E-chart.  FINAL DIAGNOSES: 1. Cecal mass with contained perforation, question primary colon mass     versus appendiceal neoplasm, or other process of the terminal     ileum. 2. History of aortic stenosis. 3. Hypertension. 4. History of peripheral vascular disease. 5. High cholesterol levels.     Dalia Heading, M.D.     MAJ/MEDQ  D:  03/15/2011  T:  03/16/2011  Job:  [id number]  cc:   Nanetta Batty,  M.D. Fax: [phone]  [place] Daphine Deutscher, MD [address], [address] [place] [address]  Electronically Signed by Franky Macho M.D. on 03/16/2011 09:19:39 AM

## 2011-03-17 LAB — BASIC METABOLIC PANEL
BUN: 4 mg/dL — ABNORMAL LOW (ref 6–23)
CO2: 24 mEq/L (ref 19–32)
Chloride: 111 mEq/L (ref 96–112)
Creatinine, Ser: 0.64 mg/dL (ref 0.4–1.5)
Glucose, Bld: 136 mg/dL — ABNORMAL HIGH (ref 70–99)
Potassium: 4 mEq/L (ref 3.5–5.1)

## 2011-03-17 LAB — CBC
HCT: 38.6 % — ABNORMAL LOW (ref 39.0–52.0)
Hemoglobin: 13 g/dL (ref 13.0–17.0)
MCH: 31.6 pg (ref 26.0–34.0)
MCV: 93.9 fL (ref 78.0–100.0)
Platelets: 263 10*3/uL (ref 150–400)
RBC: 4.11 MIL/uL — ABNORMAL LOW (ref 4.22–5.81)
WBC: 17.7 10*3/uL — ABNORMAL HIGH (ref 4.0–10.5)

## 2011-03-18 LAB — CROSSMATCH
Antibody Screen: NEGATIVE
Unit division: 0
Unit division: 0

## 2011-03-18 LAB — BASIC METABOLIC PANEL
CO2: 27 mEq/L (ref 19–32)
Calcium: 8.7 mg/dL (ref 8.4–10.5)
Chloride: 104 mEq/L (ref 96–112)
Creatinine, Ser: 0.65 mg/dL (ref 0.4–1.5)
Glucose, Bld: 152 mg/dL — ABNORMAL HIGH (ref 70–99)

## 2011-03-19 ENCOUNTER — Inpatient Hospital Stay (HOSPITAL_COMMUNITY): Payer: Medicare Other

## 2011-03-19 LAB — BASIC METABOLIC PANEL
CO2: 29 mEq/L (ref 19–32)
Calcium: 8.7 mg/dL (ref 8.4–10.5)
Chloride: 103 mEq/L (ref 96–112)
Creatinine, Ser: 0.69 mg/dL (ref 0.4–1.5)
GFR calc Af Amer: >60 mL/min (ref >60)
Glucose, Bld: 125 mg/dL — ABNORMAL HIGH (ref 70–99)

## 2011-03-20 LAB — BASIC METABOLIC PANEL
CO2: 28 mEq/L (ref 19–32)
Calcium: 8.6 mg/dL (ref 8.4–10.5)
Chloride: 108 mEq/L (ref 96–112)
Creatinine, Ser: 0.63 mg/dL (ref 0.4–1.5)
GFR calc Af Amer: >60 mL/min (ref >60)
Glucose, Bld: 133 mg/dL — ABNORMAL HIGH (ref 70–99)
Sodium: 140 mEq/L (ref 135–145)

## 2011-03-20 LAB — PROTIME-INR
INR: 1.34 (ref 0.00–1.49)
Prothrombin Time: 16.8 seconds — ABNORMAL HIGH (ref 11.6–15.2)

## 2011-03-21 ENCOUNTER — Other Ambulatory Visit: Payer: Self-pay | Admitting: General Surgery

## 2011-03-21 ENCOUNTER — Inpatient Hospital Stay (HOSPITAL_COMMUNITY): Payer: Medicare Other

## 2011-03-21 LAB — CBC
HCT: 37.7 % — ABNORMAL LOW (ref 39.0–52.0)
Hemoglobin: 12.6 g/dL — ABNORMAL LOW (ref 13.0–17.0)
MCH: 31.6 pg (ref 26.0–34.0)
MCH: 31.9 pg (ref 26.0–34.0)
MCHC: 33.4 g/dL (ref 30.0–36.0)
MCHC: 34 g/dL (ref 30.0–36.0)
MCV: 94.5 fL (ref 78.0–100.0)
MCV: 93.7 fL (ref 78.0–100.0)
Platelets: 278 10*3/uL (ref 150–400)
RDW: 15.2 % (ref 11.5–15.5)
RDW: 15 % (ref 11.5–15.5)

## 2011-03-21 LAB — BASIC METABOLIC PANEL
BUN: 2 mg/dL — ABNORMAL LOW (ref 6–23)
Calcium: 8.2 mg/dL — ABNORMAL LOW (ref 8.4–10.5)
Chloride: 107 mEq/L (ref 96–112)
Creatinine, Ser: 0.59 mg/dL (ref 0.4–1.5)
GFR calc non Af Amer: >60 mL/min (ref >60)

## 2011-03-21 LAB — MAGNESIUM: Magnesium: 1.6 mg/dL (ref 1.5–2.5)

## 2011-03-22 LAB — CBC
HCT: 39.9 % (ref 39.0–52.0)
Hemoglobin: 13.4 g/dL (ref 13.0–17.0)
MCHC: 33.6 g/dL (ref 30.0–36.0)
Platelets: 244 10*3/uL (ref 150–400)
RBC: 4.29 MIL/uL (ref 4.22–5.81)
RDW: 15 % (ref 11.5–15.5)
WBC: 27.5 10*3/uL — ABNORMAL HIGH (ref 4.0–10.5)

## 2011-03-22 LAB — BASIC METABOLIC PANEL
BUN: 4 mg/dL — ABNORMAL LOW (ref 6–23)
Creatinine, Ser: 0.75 mg/dL (ref 0.4–1.5)
GFR calc non Af Amer: >60 mL/min (ref >60)

## 2011-03-22 LAB — MAGNESIUM: Magnesium: 1.6 mg/dL (ref 1.5–2.5)

## 2011-03-23 LAB — BASIC METABOLIC PANEL
BUN: 8 mg/dL (ref 6–23)
CO2: 27 mEq/L (ref 19–32)
Calcium: 8.5 mg/dL (ref 8.4–10.5)
Creatinine, Ser: 0.57 mg/dL (ref 0.4–1.5)
GFR calc Af Amer: >60 mL/min (ref >60)

## 2011-03-23 LAB — CBC
Hemoglobin: 11.7 g/dL — ABNORMAL LOW (ref 13.0–17.0)
MCH: 31.2 pg (ref 26.0–34.0)
MCHC: 33.5 g/dL (ref 30.0–36.0)
MCV: 93.1 fL (ref 78.0–100.0)
Platelets: 209 10*3/uL (ref 150–400)

## 2011-03-23 LAB — PHOSPHORUS: Phosphorus: 2.4 mg/dL (ref 2.3–4.6)

## 2011-03-24 ENCOUNTER — Inpatient Hospital Stay (HOSPITAL_COMMUNITY): Payer: Medicare Other

## 2011-03-24 LAB — CBC
HCT: 32.4 % — ABNORMAL LOW (ref 39.0–52.0)
Hemoglobin: 10.7 g/dL — ABNORMAL LOW (ref 13.0–17.0)
MCH: 30.7 pg (ref 26.0–34.0)
MCHC: 33 g/dL (ref 30.0–36.0)
MCV: 92.8 fL (ref 78.0–100.0)
Platelets: 187 K/uL (ref 150–400)
RBC: 3.49 MIL/uL — ABNORMAL LOW (ref 4.22–5.81)
RDW: 15 % (ref 11.5–15.5)
WBC: 16.7 K/uL — ABNORMAL HIGH (ref 4.0–10.5)

## 2011-03-24 LAB — TYPE AND SCREEN
ABO/RH(D): O POS
Antibody Screen: NEGATIVE
Unit division: 0

## 2011-03-24 LAB — BASIC METABOLIC PANEL
BUN: 7 mg/dL (ref 6–23)
Chloride: 106 mEq/L (ref 96–112)
Creatinine, Ser: 0.58 mg/dL (ref 0.4–1.5)
Glucose, Bld: 128 mg/dL — ABNORMAL HIGH (ref 70–99)

## 2011-03-25 LAB — BASIC METABOLIC PANEL
Calcium: 8.5 mg/dL (ref 8.4–10.5)
Creatinine, Ser: 0.56 mg/dL (ref 0.4–1.5)
GFR calc Af Amer: >60 mL/min (ref >60)
GFR calc non Af Amer: >60 mL/min (ref >60)
Sodium: 140 mEq/L (ref 135–145)

## 2011-03-25 LAB — CBC
Hemoglobin: 10.5 g/dL — ABNORMAL LOW (ref 13.0–17.0)
MCH: 30.4 pg (ref 26.0–34.0)
MCHC: 32.7 g/dL (ref 30.0–36.0)
Platelets: 201 10*3/uL (ref 150–400)
RDW: 15 % (ref 11.5–15.5)

## 2011-03-27 LAB — BASIC METABOLIC PANEL
CO2: 29 mEq/L (ref 19–32)
Chloride: 107 mEq/L (ref 96–112)
Creatinine, Ser: 0.63 mg/dL (ref 0.4–1.5)
GFR calc Af Amer: >60 mL/min (ref >60)
Sodium: 140 mEq/L (ref 135–145)

## 2011-03-27 LAB — CBC
Hemoglobin: 11.6 g/dL — ABNORMAL LOW (ref 13.0–17.0)
Platelets: 231 10*3/uL (ref 150–400)
RBC: 3.76 MIL/uL — ABNORMAL LOW (ref 4.22–5.81)
WBC: 15.7 10*3/uL — ABNORMAL HIGH (ref 4.0–10.5)

## 2011-03-28 LAB — CBC
Hemoglobin: 10.5 g/dL — ABNORMAL LOW (ref 13.0–17.0)
MCH: 30.4 pg (ref 26.0–34.0)
MCV: 91.9 fL (ref 78.0–100.0)
Platelets: 228 10*3/uL (ref 150–400)
RBC: 3.45 MIL/uL — ABNORMAL LOW (ref 4.22–5.81)
WBC: 16.6 10*3/uL — ABNORMAL HIGH (ref 4.0–10.5)

## 2011-03-28 LAB — BASIC METABOLIC PANEL
CO2: 31 mEq/L (ref 19–32)
Chloride: 106 mEq/L (ref 96–112)
Creatinine, Ser: 0.49 mg/dL (ref 0.4–1.5)
GFR calc Af Amer: >60 mL/min (ref >60)
Potassium: 3.1 mEq/L — ABNORMAL LOW (ref 3.5–5.1)

## 2011-03-28 LAB — MAGNESIUM: Magnesium: 2.2 mg/dL (ref 1.5–2.5)

## 2011-03-29 LAB — BASIC METABOLIC PANEL
BUN: 9 mg/dL (ref 6–23)
CO2: 30 mEq/L (ref 19–32)
Chloride: 106 mEq/L (ref 96–112)
Glucose, Bld: 107 mg/dL — ABNORMAL HIGH (ref 70–99)
Potassium: 4 mEq/L (ref 3.5–5.1)
Sodium: 141 mEq/L (ref 135–145)

## 2011-03-29 LAB — CBC
HCT: 34.2 % — ABNORMAL LOW (ref 39.0–52.0)
Hemoglobin: 11.6 g/dL — ABNORMAL LOW (ref 13.0–17.0)
MCV: 91.4 fL (ref 78.0–100.0)
WBC: 15.7 10*3/uL — ABNORMAL HIGH (ref 4.0–10.5)

## 2011-03-29 NOTE — H&P (Signed)
NAME:  Charles Faulkner, Charles Faulkner NO.:  [id number]  MEDICAL RECORD NO.:  [id number]           PATIENT TYPE:  I  LOCATION:  3707                         FACILITY:  [facility]  PHYSICIAN:  [place]. Daphine Deutscher, MD  DATE OF BIRTH:  1926/04/20  DATE OF ADMISSION:  03/16/2011 DATE OF DISCHARGE:                             HISTORY & PHYSICAL   CARDIOLOGIST:  [facility].  CHIEF COMPLAINT:  Right lower quadrant abdominal pain.  HISTORY OF PRESENT ILLNESS:  Charles Faulkner is an 77 year old white male who is a patient of Dr. Jena Gauss, complaining of abdominal pain.  He had an outpatient CT scan ordered which found evidence of a cecal mass with questionable contained perforation.  Decision was made to admit the patient up in [facility] for continued management and workup. The patient has an underlying history of coronary artery disease. Therefore, the Cardiology Service was also consulted.  The patient was seen and eventually admitted by Dr. Franky Macho at [facility]. The patient's history is reviewed and his history and physical has been reviewed as of yesterday.  No significant change to the patient's history of present illness is noted.  However, the cardiovascular team felt that the patient was subsequently higher risk and therefore, will require more stringent cardiac care and therefore, requested transfer to [facility] for this to be accomplished.  Dr. Lovell Sheehan contacted Dr. Daphine Deutscher who was happy to accept the patient to continue management here at [facility].  PAST MEDICAL HISTORY:  Consistent with: 1. Coronary artery disease. 2. Peripheral vascular disease. 3. Hyperlipidemia. 4. Hypertension. 5. Chronic anemia.  PAST SURGICAL HISTORY:  Consistent with right carotid endarterectomy and previous coronary bypass grafting.  MEDICATIONS:  Plavix, Colace, hydrochlorothiazide, Prevacid, Lipitor, niacin, Tenormin, and Zetia.  ALLERGIES:  The  patient reports allergy to [facility].  REVIEW OF SYSTEMS:  Complete 10-system review found, pertinent history of present illness noted.  FAMILY HISTORY:  No prior family history of colonic disease or cancer.  PHYSICAL EXAMINATION:  GENERAL:  An elderly 77 year old Caucasian male who is in no distress at present time. MOST RECENT VITAL:  Temperature of 98.2, heart rate of 61, respiratory rate of 16, blood pressure of 120/77, oxygen saturation 97% on room air. ENT:  Unremarkable. NECK:  Supple without lymphadenopathy.  Trachea is midline.  No thyromegaly or masses. LUNGS:  Slightly diminished breath sounds with rales bilaterally. HEART:  Heart rate is irregular, but no murmurs, gallops, or rubs. Carotids 2+ and brisk without bruit.  Peripheral pulses diminished, but intact. ABDOMEN:  The patient was mildly distended and has mild tenderness in the right lower quadrant without rebound or peritonitis.  Positive active bowel sounds. RECTAL:  Deferred. EXTREMITIES:  Good active range of motion of all extremities without crepitus or pain.  Normal muscle strength and tone without atrophy. SKIN:  Otherwise warm and dry with good turgor.  No rashes, lesions, nodules. NEUROLOGIC:  The patient is alert and oriented x3.  DIAGNOSTICS:  Most recent labs show a CBC with white blood cell count of 9.3, hemoglobin of 13.1, hematocrit of 40, platelet count of 264. Metabolic  panel shows a sodium of 138, potassium of 4.1, chloride of 109, CO2 of 25, BUN of 4, creatinine of 0.56, glucose of 114, phosphorus 2.5, magnesium 2.0.  CEA actually normal at 4.4.  IMAGING:  CT scan of the abdomen and pelvis again finds an irregular mass centered at the cecum measuring approximately 5 x 6 cm.  On the lateral aspect of the mass is a small collection with locules of air, possibly consistent with microperforation.  Small amount of free fluid, but no discrete abscess is appreciated.  Chest x-ray showed no  acute cardiopulmonary process.  IMPRESSION: 1. Cecal mass with contained perforation, question malignant versus     inflammatory process. 2. History of aortic stenosis. 3. Coronary artery disease. 4. Peripheral vascular disease. 5. Diminished ejection fraction at 45% on echocardiogram from November     2011.  RECOMMENDATIONS:  Agree with current cardiac recommendations as workup is in process with the exception of Plavix and the patient is thought likely to need surgical interventions and therefore, Plavix needs to be held.  However, the patient will have continued workup and evaluation of this, possibly Gastroenterology consult for endoscopy.  We will happily follow this patient along during the course of his hospitalization.     Brayton El, PA-C   ______________________________ [place] Daphine Deutscher, MD    KB/MEDQ  D:  03/16/2011  T:  03/17/2011  Job:  [id number]  Electronically Signed by Brayton El  on 03/19/2011 02:13:10 PM Electronically Signed by Luretha Murphy MD on 03/29/2011 08:44:42 AM

## 2011-03-30 LAB — CBC
HCT: 33.2 % — ABNORMAL LOW (ref 39.0–52.0)
Hemoglobin: 11 g/dL — ABNORMAL LOW (ref 13.0–17.0)
MCH: 30.3 pg (ref 26.0–34.0)
MCHC: 33.1 g/dL (ref 30.0–36.0)
MCV: 91.5 fL (ref 78.0–100.0)
RDW: 14.8 % (ref 11.5–15.5)

## 2011-03-30 LAB — BASIC METABOLIC PANEL
BUN: 10 mg/dL (ref 6–23)
CO2: 30 mEq/L (ref 19–32)
Calcium: 9.8 mg/dL (ref 8.4–10.5)
Creatinine, Ser: 0.54 mg/dL (ref 0.4–1.5)
Glucose, Bld: 116 mg/dL — ABNORMAL HIGH (ref 70–99)
Sodium: 141 mEq/L (ref 135–145)

## 2011-04-02 DIAGNOSIS — G9341 Metabolic encephalopathy: Secondary | ICD-10-CM

## 2011-04-02 DIAGNOSIS — R5381 Other malaise: Secondary | ICD-10-CM

## 2011-04-03 NOTE — Op Note (Signed)
NAME:  Charles Faulkner, Charles Faulkner NO.:  [id number]  MEDICAL RECORD NO.:  [id number]          PATIENT TYPE:  LOCATION:                                 FACILITY:  PHYSICIAN:  Almond Lint, MD       DATE OF BIRTH:  12-27-1925  DATE OF PROCEDURE:  03/21/2011 DATE OF DISCHARGE:                              OPERATIVE REPORT   PREOPERATIVE DIAGNOSIS:  Right colon mass.  POSTOPERATIVE DIAGNOSIS:  cT4 N1 MX adenocarcinoma of the colon.  PROCEDURE:  Right hemicolectomy and small bowel resection, intraoperative liver ultrasound.  SURGEON:  Almond Lint, MD  ASSISTANTS: 1. Lorne Skeens. Hoxworth, MD 2. Ollen Gross. Vernell Morgans, MD  TYPE OF ANESTHESIA:  General and local.  FINDINGS:  Large cecal mass perforated into the retroperitoneum and invading an additional loop of small bowel.  Additionally, there were nodules on the anterior surface of the rectum as well as in the pelvis, one of which was sent for biopsy.  Liver ultrasound did not demonstrate any masses and showed good liver parenchyma and patency of the vessels.  SPECIMEN: 1. Right hemicolectomy and small bowel resection. 2. Pelvic peritoneal nodule.  ESTIMATED BLOOD LOSS:  100 mL.  COMPLICATIONS:  None known.  PROCEDURE:  Charles Faulkner was identified in the holding area, and an arterial line and central line were replaced by Anesthesia in the holding area.  He was taken to the operating room and placed supine on the operating room table.  General anesthesia was induced.  A Foley catheter was placed.  His abdomen was prepped and draped in sterile fashion.  Time-out was performed according to the surgical safety check list.  When all was correct, we continued.  The midline was incised below the umbilicus with a #10 blade and subcutaneous tissues and fascia were opened with the cautery.  The peritoneum was elevated with a tonsil and divided sharply and then the fascial incision was carried out the length of the skin incision  with the cautery.  It was immediately apparent that the large mass was in then the right lower quadrant, but the incision was extended slightly above the umbilicus and inferiorly to the pubis.  The Bookwalter retractor was placed for assistance with visualization.  What was immediately apparent was the loop of small bowel was adherent to the mass and so this was isolated and the two limbs were divided with GIA75.  These were secured to one another and a side-to-side end-to-end anastomosis was created in the standard fashion with a GIA75 stapler.  The mesentery of the loop was taken with the LigaSure.  The mesenteric defect was closed with an interrupted 2-0 Vicryl taking care not to obstruct the blood flow.  The white line of Toldt was taken down the right colon and the omentum was taken off the transverse colon.  The hepatic flexure was taken down bluntly and with the cautery.  Once adequate right had been mobilized, the proximal transverse colon was divided with the GIA75 as well.  The adherent mass was taken with a combination of blunt dissection with the right angle and then with finger fracture.  The mass was adherent to the iliac vessels and positive margins could not be obtained.  There was a perforated cavity extending behind the iliac vessels around the centimeter.  Clips were placed on this area.  The ureter was identified and was clearly medial to this area.  The remaining mesocolon was taken with the LigaSure other than the right colic vessel.  There was a large firm node that was high at the origination of this vessel and the right colic artery was doubly clamped and then suture ligated and then tied off with 2-0 Vicryl.  The specimen was then passed off the table.  The abdomen was then irrigated with water, and side-to-side functional end- to-end anastomosis was created with the ileum and the transverse colon. The anastomosis was inspected making sure that there was no  bleeding intraluminally.  The TA was used to complete the defect.  The mesenteric defect was closed with interrupted 2-0 Vicryl as well, and abdomen was then irrigated copiously with saline and then with water.  The peritoneum was then examined, and there were around 3-four nodules on the anterior surface of the rectum that cannot safely be biopsied as they were part of the rectal wall.  There was an additional nodule higher up in the peritoneum that was biopsied sharply with scissors and then this defect was closed with 2-0 Vicryl.  The attention was then directed to the liver and the liver was ultrasounded segmentally.  The vascular structures were identified including the right portal vein and left portal vein, portal vein trifurcation, gallbladder, right and left middle hepatic veins, and the right kidney.  All segments of the liver were scanned and there was no evidence of small lesion present.  The stab incisions were made in the skin with #11 blade and then the On-Q pain pumps were placed laterally.  Seprafilm was placed in the abdomen and then the fascia was closed with #1 loop PDS suture.  The skin was then irrigated and closed with staples and skin wicks.  The wounds were then cleaned, dried, and dressed with Tegaderm.  The On-Q pain pump catheters were advanced through the tunneler sheaths and the tummler sheaths were removed.  These were secured with Steri-Strips and Tegaderm.  They were flushed with 0.25% Marcaine.  These were then dressed with Tegaderm.  The patient was awakened from anesthesia and taken to PACU in stable condition.     Almond Lint, MD     FB/MEDQ  D:  03/21/2011  T:  03/22/2011  Job:  [id number]  Electronically Signed by Almond Lint MD on 04/03/2011 04:07:56 PM

## 2011-04-04 ENCOUNTER — Ambulatory Visit (HOSPITAL_COMMUNITY)
Admission: RE | Admit: 2011-04-04 | Discharge: 2011-04-04 | Disposition: A | Discharge status: Home / Self Care | Payer: Medicare Other | Source: Ambulatory Visit | Attending: Internal Medicine | Admitting: Internal Medicine

## 2011-04-04 ENCOUNTER — Other Ambulatory Visit (HOSPITAL_COMMUNITY): Payer: Self-pay | Admitting: Internal Medicine

## 2011-04-04 DIAGNOSIS — R609 Edema, unspecified: Secondary | ICD-10-CM

## 2011-04-05 ENCOUNTER — Ambulatory Visit (HOSPITAL_COMMUNITY)
Admission: RE | Admit: 2011-04-05 | Discharge: 2011-04-05 | Disposition: A | Discharge status: Home / Self Care | Payer: Medicare Other | Source: Ambulatory Visit | Attending: Internal Medicine | Admitting: Internal Medicine

## 2011-04-05 DIAGNOSIS — M7989 Other specified soft tissue disorders: Secondary | ICD-10-CM | POA: Insufficient documentation

## 2011-04-05 DIAGNOSIS — Z9889 Other specified postprocedural states: Secondary | ICD-10-CM | POA: Insufficient documentation

## 2011-04-06 NOTE — Discharge Summary (Signed)
NAME:  Charles Faulkner, Charles Faulkner NO.:  [id number]  MEDICAL RECORD NO.:  [id number]           PATIENT TYPE:  I  LOCATION:  3704                         FACILITY:  [facility]  PHYSICIAN:  Clovis Pu. Deuntae Kocsis, M.D.DATE OF BIRTH:  [birth year]  DATE OF ADMISSION:  03/16/2011 DATE OF DISCHARGE:  04/02/2011                              DISCHARGE SUMMARY   CONSULTANTS:  [facility].  PROCEDURES:  Right hemicolectomy and small bowel resection with intraoperative liver ultrasound by Dr. Almond Lint on March 21, 2011. Pathology revealed a T4, N2, M1 invasive moderately differentiated adenocarcinoma of the colon along with a metastatic adenocarcinoma from a peritoneal biopsy.  REASON FOR ADMISSION:  Charles Faulkner is an 77 year old male who is a patient of Charles Faulkner who began complaining of abdominal pain.  He had an outpatient CT scan which revealed a cecal mass with questionable microperforation.  He was admitted to the [facility], however, due to his cardiac risk the cardiologist felt he would be more stable to have surgical intervention here at [facility].  Therefore, the patient was transferred from [facility] to [facility] where we accepted the patient.  Please see admitting history and physical for further details.  ADMITTING DIAGNOSES: 1. Cecal mass with contained perforation and questionable malignant     versus inflammatory process. 2. History of aortic stenosis. 3. Coronary artery disease. 4. Peripheral vascular disease. 5. Ejection fraction of 45% on echocardiogram in November 2011. 6. Chronic anemia. 7. Hyperlipidemia.  HOSPITAL COURSE:  At this time, the patient was admitted.  He is normally on Plavix and this was held.  Cardiology saw the patient upon arrival here to [facility].  They adjusted some of the patient's cardiac medications including Lasix.  After 5 days of being off of Plavix as well as after fine-tuning by Cardiology on March 21, 2011, the patient was taken to the operating room where he underwent laparotomy with a right hemicolectomy and small bowel resection with intraoperative ultrasound of the liver as well as a peritoneal biopsy.  The patient actually tolerated this procedure quite well.  However, he was sent to an ICU postoperatively for close monitoring.  On postoperative day 1, the patient was allowed to have ice chips.  However, was otherwise kept n.p.o. to await bowel function.  On postop day #3, the patient was having some flatus.  He was allowed to have clear liquids at this time. Over the next several days, the patient's diet was advanced as tolerated.  On postoperative day #9, the patient's abdomen was soft and minimally tender.  He did have several areas of drainage from his wound. There was minimal erythema around the anterior portion of the wound with some serous drainage from the middle and superior portion.  Therefore, several staples were discontinued at this time and at the most inferior portion of his incision had purulent drainage.  This was opened up slightly and was packed.  This quickly improved and the wound was clean, but did still require minimal packing throughout the rest of the hospitalization.  During this time, the patient did have deconditioning.  PT felt that the patient would benefit from some inpatient rehab as the wife refused SNF. After their evaluation though they did not felt the patient was appropriate as towards the end of hospitalization the patient was more confused and they felt he would be better served at home.  Therefore, home health was set up for a hospital bed as well as other necessity such as physical therapy, nursing and a home health aide.  The patient remained stable from a cardiac standpoint postoperatively. He was restarted on his Plavix approximately 5-6 days postop.  The patient had no bleeding issues.  DISCHARGE DIAGNOSES: 1. Metastatic  adenocarcinoma of the colon which is T4M1. 2. Status post laparotomy with right hemicolectomy, small bowel     resection and ultrasound of the liver. 3. Wound infection which is improved and resolving. 4. Coronary artery disease. 5. Atrial fibrillation. 6. Congestive heart failure all stable. 7. Hyperlipidemia. 8. Peripheral vascular disease. 9. Chronic anemia. 10.Deconditioning. 11.Confusion.  DISCHARGE MEDICATIONS:  Please see medication reconciliation form.  DISCHARGE INSTRUCTIONS:  The patient may increase his activity slowly and walk up steps with physical therapy.  He may shower, however he is not to bathe.  He is not to do any heavy lifting for the next 6 weeks greater than 10-15 pounds.  He should not be driving for at least the next 2-3 weeks or until normal and cognitive function returns.  He has no dietary restrictions.  He is to do normal saline wet-to-dry dressing changes to his abdominal wound twice a day per home health.  He will return to see Dr. Donell Beers in our office on Apr 10, 2011, at 9:10 a.m.. He is also scheduled to see Dr. [name] this Wednesday, Apr 04, 2011, at 11 a.m.  He may follow up with his cardiologist as needed as well as Charles Faulkner as needed.     Letha Cape, PA   ______________________________ Clovis Pu Hadley Soileau, M.D.    KEO/MEDQ  D:  04/02/2011  T:  04/02/2011  Job:  [id number]  cc:   Almond Lint, MD R. Roetta Sessions, M.D. [facility]  Electronically Signed by Barnetta Chapel PA on 04/06/2011 11:05:59 AM Electronically Signed by Harriette Bouillon M.D. on 04/06/2011 11:16:57 AM

## 2011-04-10 ENCOUNTER — Encounter (HOSPITAL_COMMUNITY): Payer: Self-pay | Admitting: Radiology

## 2011-04-10 ENCOUNTER — Inpatient Hospital Stay (HOSPITAL_COMMUNITY): Payer: Medicare Other

## 2011-04-10 ENCOUNTER — Inpatient Hospital Stay (HOSPITAL_COMMUNITY)
Admission: EM | Admit: 2011-04-10 | Discharge: 2011-04-18 | DRG: 389 | Disposition: A | Discharge status: Home Health | Payer: Medicare Other | Attending: General Surgery | Admitting: General Surgery

## 2011-04-10 ENCOUNTER — Emergency Department (HOSPITAL_COMMUNITY)
Admission: EM | Admit: 2011-04-10 | Discharge: 2011-04-10 | Disposition: A | Discharge status: Home / Self Care | Payer: Medicare Other | Source: Home / Self Care | Attending: Emergency Medicine | Admitting: Emergency Medicine

## 2011-04-10 ENCOUNTER — Emergency Department (HOSPITAL_COMMUNITY): Payer: Medicare Other

## 2011-04-10 DIAGNOSIS — E785 Hyperlipidemia, unspecified: Secondary | ICD-10-CM | POA: Diagnosis present

## 2011-04-10 DIAGNOSIS — K560 Paralytic ileus: Secondary | ICD-10-CM | POA: Diagnosis present

## 2011-04-10 DIAGNOSIS — Z951 Presence of aortocoronary bypass graft: Secondary | ICD-10-CM

## 2011-04-10 DIAGNOSIS — I2510 Atherosclerotic heart disease of native coronary artery without angina pectoris: Secondary | ICD-10-CM | POA: Diagnosis present

## 2011-04-10 DIAGNOSIS — E7800 Pure hypercholesterolemia, unspecified: Secondary | ICD-10-CM | POA: Insufficient documentation

## 2011-04-10 DIAGNOSIS — Z9861 Coronary angioplasty status: Secondary | ICD-10-CM

## 2011-04-10 DIAGNOSIS — K56609 Unspecified intestinal obstruction, unspecified as to partial versus complete obstruction: Principal | ICD-10-CM | POA: Diagnosis present

## 2011-04-10 DIAGNOSIS — K910 Vomiting following gastrointestinal surgery: Secondary | ICD-10-CM | POA: Insufficient documentation

## 2011-04-10 DIAGNOSIS — L89109 Pressure ulcer of unspecified part of back, unspecified stage: Secondary | ICD-10-CM | POA: Diagnosis present

## 2011-04-10 DIAGNOSIS — L8995 Pressure ulcer of unspecified site, unstageable: Secondary | ICD-10-CM | POA: Diagnosis present

## 2011-04-10 DIAGNOSIS — Y849 Medical procedure, unspecified as the cause of abnormal reaction of the patient, or of later complication, without mention of misadventure at the time of the procedure: Secondary | ICD-10-CM | POA: Insufficient documentation

## 2011-04-10 DIAGNOSIS — R5381 Other malaise: Secondary | ICD-10-CM | POA: Diagnosis present

## 2011-04-10 DIAGNOSIS — Z7902 Long term (current) use of antithrombotics/antiplatelets: Secondary | ICD-10-CM

## 2011-04-10 DIAGNOSIS — D72829 Elevated white blood cell count, unspecified: Secondary | ICD-10-CM | POA: Diagnosis present

## 2011-04-10 DIAGNOSIS — I10 Essential (primary) hypertension: Secondary | ICD-10-CM | POA: Insufficient documentation

## 2011-04-10 DIAGNOSIS — C7490 Malignant neoplasm of unspecified part of unspecified adrenal gland: Secondary | ICD-10-CM | POA: Diagnosis present

## 2011-04-10 LAB — COMPREHENSIVE METABOLIC PANEL
BUN: 23 mg/dL (ref 6–23)
Calcium: 10.1 mg/dL (ref 8.4–10.5)
Creatinine, Ser: 0.68 mg/dL (ref 0.4–1.5)
GFR calc non Af Amer: >60 mL/min (ref >60)
Glucose, Bld: 162 mg/dL — ABNORMAL HIGH (ref 70–99)
Sodium: 143 mEq/L (ref 135–145)
Total Protein: 5.5 g/dL — ABNORMAL LOW (ref 6.0–8.3)

## 2011-04-10 LAB — DIFFERENTIAL
Basophils Absolute: 0 10*3/uL (ref 0.0–0.1)
Basophils Relative: 0 % (ref 0–1)
Eosinophils Relative: 0 % (ref 0–5)
Lymphocytes Relative: 6 % — ABNORMAL LOW (ref 12–46)
Lymphs Abs: 0.8 10*3/uL (ref 0.7–4.0)
Monocytes Absolute: 0.8 10*3/uL (ref 0.1–1.0)
Monocytes Absolute: 1.1 10*3/uL — ABNORMAL HIGH (ref 0.1–1.0)
Monocytes Relative: 4 % (ref 3–12)
Monocytes Relative: 5 % (ref 3–12)
Neutro Abs: 20.2 10*3/uL — ABNORMAL HIGH (ref 1.7–7.7)

## 2011-04-10 LAB — CBC
HCT: 40.3 % (ref 39.0–52.0)
HCT: 37.8 % — ABNORMAL LOW (ref 39.0–52.0)
MCH: 30.7 pg (ref 26.0–34.0)
MCHC: 33.9 g/dL (ref 30.0–36.0)
MCV: 93.5 fL (ref 78.0–100.0)
MCV: 90.6 fL (ref 78.0–100.0)
RDW: 15.1 % (ref 11.5–15.5)
RDW: 14.9 % (ref 11.5–15.5)
WBC: 21.8 10*3/uL — ABNORMAL HIGH (ref 4.0–10.5)

## 2011-04-10 LAB — BASIC METABOLIC PANEL
BUN: 22 mg/dL (ref 6–23)
CO2: 34 mEq/L — ABNORMAL HIGH (ref 19–32)
Chloride: 101 mEq/L (ref 96–112)
Creatinine, Ser: 0.85 mg/dL (ref 0.4–1.5)
GFR calc Af Amer: >60 mL/min (ref >60)
Potassium: 3.4 mEq/L — ABNORMAL LOW (ref 3.5–5.1)

## 2011-04-10 LAB — PRO B NATRIURETIC PEPTIDE: Pro B Natriuretic peptide (BNP): 4039 pg/mL — ABNORMAL HIGH (ref 0–450)

## 2011-04-10 LAB — MAGNESIUM: Magnesium: 2.3 mg/dL (ref 1.5–2.5)

## 2011-04-10 MED ORDER — IOHEXOL 300 MG/ML  SOLN
80.0000 mL | Freq: Once | INTRAMUSCULAR | Status: AC | PRN
  Administered 2011-04-10: 80 mL via INTRAVENOUS

## 2011-04-11 ENCOUNTER — Inpatient Hospital Stay (HOSPITAL_COMMUNITY): Payer: Medicare Other

## 2011-04-11 HISTORY — PX: OTHER SURGICAL HISTORY: SHX169

## 2011-04-11 LAB — BASIC METABOLIC PANEL
Calcium: 9.9 mg/dL (ref 8.4–10.5)
Creatinine, Ser: 0.7 mg/dL (ref 0.4–1.5)
GFR calc Af Amer: >60 mL/min (ref >60)
GFR calc non Af Amer: >60 mL/min (ref >60)
Glucose, Bld: 176 mg/dL — ABNORMAL HIGH (ref 70–99)
Sodium: 141 mEq/L (ref 135–145)

## 2011-04-11 LAB — SURGICAL PCR SCREEN
MRSA, PCR: NEGATIVE
Staphylococcus aureus: NEGATIVE

## 2011-04-11 LAB — CBC
MCH: 30.2 pg (ref 26.0–34.0)
MCHC: 33.2 g/dL (ref 30.0–36.0)
RDW: 15.1 % (ref 11.5–15.5)

## 2011-04-11 LAB — PREALBUMIN: Prealbumin: 10.2 mg/dL — ABNORMAL LOW (ref 17.0–34.0)

## 2011-04-12 ENCOUNTER — Inpatient Hospital Stay (HOSPITAL_COMMUNITY): Payer: Medicare Other

## 2011-04-12 LAB — CBC
HCT: 38.9 % — ABNORMAL LOW (ref 39.0–52.0)
MCH: 29.8 pg (ref 26.0–34.0)
MCV: 92.6 fL (ref 78.0–100.0)
Platelets: 453 10*3/uL — ABNORMAL HIGH (ref 150–400)
RBC: 4.2 MIL/uL — ABNORMAL LOW (ref 4.22–5.81)
RDW: 15.2 % (ref 11.5–15.5)

## 2011-04-12 LAB — COMPREHENSIVE METABOLIC PANEL
Albumin: 2 g/dL — ABNORMAL LOW (ref 3.5–5.2)
Alkaline Phosphatase: 83 U/L (ref 39–117)
BUN: 20 mg/dL (ref 6–23)
Calcium: 9.7 mg/dL (ref 8.4–10.5)
Creatinine, Ser: 0.59 mg/dL (ref 0.4–1.5)
GFR calc Af Amer: >60 mL/min (ref >60)
Glucose, Bld: 205 mg/dL — ABNORMAL HIGH (ref 70–99)
Potassium: 3.9 mEq/L (ref 3.5–5.1)
Total Protein: 5.4 g/dL — ABNORMAL LOW (ref 6.0–8.3)

## 2011-04-12 LAB — DIFFERENTIAL
Basophils Relative: 0 % (ref 0–1)
Eosinophils Absolute: 0 10*3/uL (ref 0.0–0.7)
Eosinophils Relative: 0 % (ref 0–5)
Lymphocytes Relative: 6 % — ABNORMAL LOW (ref 12–46)
Lymphs Abs: 1.1 10*3/uL (ref 0.7–4.0)
Monocytes Relative: 7 % (ref 3–12)
Neutro Abs: 15.9 10*3/uL — ABNORMAL HIGH (ref 1.7–7.7)
Neutrophils Relative %: 86 % — ABNORMAL HIGH (ref 43–77)

## 2011-04-12 LAB — GLUCOSE, CAPILLARY
Glucose-Capillary: 190 mg/dL — ABNORMAL HIGH (ref 70–99)
Glucose-Capillary: 200 mg/dL — ABNORMAL HIGH (ref 70–99)
Glucose-Capillary: 234 mg/dL — ABNORMAL HIGH (ref 70–99)

## 2011-04-12 LAB — MAGNESIUM: Magnesium: 2.2 mg/dL (ref 1.5–2.5)

## 2011-04-12 LAB — CHOLESTEROL, TOTAL: Cholesterol: 54 mg/dL (ref 0–200)

## 2011-04-12 LAB — PHOSPHORUS: Phosphorus: 2 mg/dL — ABNORMAL LOW (ref 2.3–4.6)

## 2011-04-13 ENCOUNTER — Inpatient Hospital Stay (HOSPITAL_COMMUNITY): Payer: Medicare Other

## 2011-04-13 LAB — BASIC METABOLIC PANEL
BUN: 15 mg/dL (ref 6–23)
CO2: 29 mEq/L (ref 19–32)
Calcium: 9 mg/dL (ref 8.4–10.5)
Chloride: 110 mEq/L (ref 96–112)
Creatinine, Ser: <0.47 mg/dL (ref 0.4–1.5)
Glucose, Bld: 115 mg/dL — ABNORMAL HIGH (ref 70–99)
Potassium: 3.5 mEq/L (ref 3.5–5.1)
Sodium: 143 mEq/L (ref 135–145)

## 2011-04-13 LAB — CBC
HCT: 36.7 % — ABNORMAL LOW (ref 39.0–52.0)
Hemoglobin: 11.8 g/dL — ABNORMAL LOW (ref 13.0–17.0)
MCH: 29.6 pg (ref 26.0–34.0)
MCHC: 32.2 g/dL (ref 30.0–36.0)
MCV: 92.2 fL (ref 78.0–100.0)
Platelets: 334 10*3/uL (ref 150–400)
RBC: 3.98 MIL/uL — ABNORMAL LOW (ref 4.22–5.81)
RDW: 15 % (ref 11.5–15.5)
WBC: 13.1 10*3/uL — ABNORMAL HIGH (ref 4.0–10.5)

## 2011-04-13 LAB — GLUCOSE, CAPILLARY
Glucose-Capillary: 102 mg/dL — ABNORMAL HIGH (ref 70–99)
Glucose-Capillary: 125 mg/dL — ABNORMAL HIGH (ref 70–99)
Glucose-Capillary: 117 mg/dL — ABNORMAL HIGH (ref 70–99)

## 2011-04-13 LAB — PHOSPHORUS: Phosphorus: 1.8 mg/dL — ABNORMAL LOW (ref 2.3–4.6)

## 2011-04-13 NOTE — Cardiovascular Report (Signed)
NAME:  YUG, LORIA NO.:  [id number]   MEDICAL RECORD NO.:  [id number]          PATIENT TYPE:  OIB   LOCATION:  2899                         FACILITY:  [facility]   PHYSICIAN:  Nanetta Batty, M.D.   DATE OF BIRTH:  1926-08-10   DATE OF PROCEDURE:  DATE OF DISCHARGE:                              CARDIAC CATHETERIZATION   PROCEDURE:  Right and left cardiac catheterization.   Mr. Behan is a 77 year old married white male with a history of CAD, status  post coronary artery bypass grafting x 5 by Dr. Charlett Lango in April  2002.  His last Cardiolite in April 2005 showed anteroapical scar with no  ischemia.  He had known moderate to severe aortic stenosis at that time,  with a valve area of 1.1 sq cm and an EF of 40-50%.  He has a history of  bilateral carotid stenosis, right greater than left, followed by duplex  ultrasound.  His other problems include hypertension, hyperlipidemia.  He  recently had an episode of unstable angina and was admitted to the hospital  for GI bleed and was transfused.  A follow-up Cardiolite revealed  interseptal scar with evidence of inferolateral and anterolateral ischemia  which were new since his last Cardiolite.  The patient presents now for  right and left heart catheterization to define his anatomy and assess the  degree of aortic stenosis.   PROCEDURE DESCRIPTION:  The patient was brought to the second floor [facility] cardiac catheterization lab in the postabsorptive state.  He was  premedicated with p.o. Valium.  His right groin was prepped and draped in  the usual sterile fashion.  One percent Xylocaine was used for local  anesthesia.  A 7 French sheath was inserted into the right femoral artery  using standard Seldinger technique.  An 8 French sheath was inserted into  the right femoral vein.  A 7 French balloon-tip thermodilution Swan-Ganz  catheter was advanced to the right heart chambers, obtaining sequential  pressures.  Thermodilution cardiac output was also performed.  6 French  right and left Judkins diagnostic catheter along with a 6 French pigtail  catheter and right saphenous vein bypass graft and left saphenous vein  bypass graft diagnostic catheters were used for left ventriculography,  supravalvular aortography, selective vein grafts and IMA angiography, and  distal abdominal aortography.  Visipaque dye was used for the entirety of  the case.  Arch aorta, ventricular, and pulmonary artery pressures were  recorded.   HEMODYNAMICS:  1.  Aortic systolic pressure 142, diastolic pressure 59.  2.  Left ventricular systolic pressure 167, and diastolic pressure 20.  3.  Aortic valve gradient peak was measured at 25 mmHg.  4.  Right atrial pressure was A wave 13, V wave 12.  5.  RV pressure systolic 41, diastolic pressure 5.  6.  PA pressure systolic 40, diastolic pressure 16, mean 27.  7.  Wedge pressure A wave 19, V wave 17, mean 14.  8.  Cardiac output by Fick was 3.6 liters/minute/sq m, with an index of 1.9      liters/minute/sq m.  The cardiac output by thermodilution was 7.3      liters/minute/sq m, with index of 3.8 liters/minute/sq m.  9.  The valve area calculated by Fick was 8.1 sq cm.   SELECTIVE CORONARY ANGIOGRAPHY:  1.  Left main:  30% distal stenosis.  2.  LAD:  The LAD exhibited competitive flow in its midportion.  There was a      tubular 80% stenosis after the first septal perforator. The first      diagonal branch which was moderate in size and bifurcated in its      midportion had a 90% fairly focal proximal stenosis and did not appear      to have competitive flow.  3.  Left circumflex:  This vessel had a 90% ostial stenosis followed by a      95% proximal stenosis after the takeoff of a large first marginal      branch.  The marginal branch had a 90% long segmental proximal and      ostial stenosis.  There was an intact jump graft between OM1 and 2.      However, the  continuation to the aorta appeared to be occluded.  4.  Right coronary artery:  This vessel was a dominant vessel, with 90%      ostial stenosis followed by occlusion at the genu.  5.  Vein graft to the distal RCA:  Patent, with a 95% aorto-ostial stenosis.  6.  Vein graft to the OM1 and OM2:  Occluded at the aorto-ostium.  7.  Vein graft to the diagonal branch:  Not visualized.  There were only two      aortic markers noted.  8.  Left ventriculography:  RAO left ventriculogram was performed using 15      cc of Visipaque dye at 10 cc/second.  The overall LVEF was estimated      visually at approximately 50%.  9.  Supravalvular aortography:  Performed in the RAO view using 20 cc of      Visipaque dye at 20 cc/second.  There was only one patent vein graft      which was the RCA vein graft.  There was moderate supravalvular aortic      root dilatation.  There was no obvious aortic insufficiency noted.  10. Distal abdominal aortography:  Distal abdominal aortogram was performed      using 20 cc of Visipaque dye at 20 cc/second.  The renal artery is      widely patent.  There was a small infrarenal abdominal aortic aneurysm      above the iliac bifurcation.   IMPRESSION:  Mr. Worrel has a patent LIMA to his LAD, with an occluded vein  graft to OM1 and 2 and the diagonal.  The vein graft to the RCA has a high-  grade ostial stenosis, and he has moderate to severe aortic stenosis, with  preserved LV function.  I will review these angiograms with my colleagues to  decide whether or not the patient needs redo bypass with aortic valve  replacement or PCI and stenting of the ostium of his RCA vein graft.   Sheath was removed and pressure was held on the groin to achieve hemostasis.  The patient left the lab in stable condition.  He will be hydrated  overnight, with the intent to potentially perform intervention tomorrow.      JB/MEDQ  D:  09/18/2004  T:  09/18/2004  Job:  [id number]   cc:  Cardiac Catheterization Laboratory   [facility]  [address]  [place] [address]   Madelin Rear. Sherwood Gambler, MD  [address]  [place] [address]  Fax: [phone]

## 2011-04-13 NOTE — H&P (Signed)
NAME:  Charles Faulkner, Charles Faulkner NO.:  [id number]   MEDICAL RECORD NO.:  [id number]          PATIENT TYPE:  INP   LOCATION:  A206                          FACILITY:  [facility]   PHYSICIAN:  Madelin Rear. Sherwood Gambler, MD  DATE OF BIRTH:  Feb 10, 1926   DATE OF ADMISSION:  01/31/2005  DATE OF DISCHARGE:  LH                                HISTORY & PHYSICAL   CHIEF COMPLAINT:  Chest pain and palpitations.   HISTORY OF PRESENT ILLNESS:  The patient presented to the emergency  department after having several episodes of bloody diarrhea. Developed a  little bit of diaphoresis and lightheadedness. This was followed by chest  pain and palpations. The duration of the chest pain and palpitations was  about 20 or 30 minutes. There was no syncope or shortness of breath. He had  no hematemesis. No abdominal pain except for some cramps associated with  loose stools.   PAST MEDICAL HISTORY:  Severe iron-deficiency anemia with negative  endoscopic upper and lower workup as well as what wife and patient describe  as questionable findings on swallowed camera study. He has required IV iron  as well as chronic iron therapy for recurrent occult GI bleed. He has known  coronary artery disease status post stenting in 2005. Previous  echocardiogram around that time showed moderate to severe aortic stenosis.   SOCIAL HISTORY:  Nonsmoker, nondrinker. Married, lives with wife.   FAMILY HISTORY:  Noncontributory.   REVIEW OF SYSTEMS:  As under HPI. All else is negative except he has  recently been placed on antibiotics for infected sebaceous cyst. Attempts at  removing these have been delayed secondary to acute intervention necessary  between his heart and his GI bleed.   PHYSICAL EXAMINATION:  SKIN:  Shows one dressed lesion and another mildly  erythematous sebaceous cyst superior to that in the posterior abdominal  area.  HEENT:  Head and neck:  No JVD or adenopathy. Neck is supple.  CHEST:  Clear.  CARDIAC:  Occasional extrasystole with pronounced aortic stenosis murmur.  Carotid upstrokes appear normal.   STUDIES:  Electrocardiogram reveals sinus rhythm, frequent PVCs, but no  ischemia changes. Nonspecific repolarization changes are noted. No old ECG  tracing is available at present for comparison. His initial cardiac enzymes  showed an elevated myoglobin of 59.3 initial, at the 2 hour bump of 79.4;  however, CPK and troponins were both normal in the emergency department.  This morning, however, his troponin was up to 0.13 with a normal CPK.  Electrolytes were unrevealing. CBC shows a marked anemia at 7.7 g percent  hemoglobin with normochromic, normocytic indices. Platelet count is normal.  White count is normal. Chest x-ray showed cardiomegaly and no frank  congestive heart failure although vascular congestion was noted by the  radiologist.   IMPRESSION:  1.  Severe anemia secondary to gastrointestinal bleed. Stool studies      pending. Rule out clostridium difficile secondary to antibiotic use for      his sebaceous cyst infection. Stool studies will be performed to confirm      the impression of  gastrointestinal bleed chronically and needs      transfusions. Apparently, the anemia forces enough increase in cardiac      output to give him unstable angina.  2.  Chest pain, palpitations, and elevated troponin. He may very well have      had a non-Q-wave infarction. Cardiology will be consulted. He will be      monitored for arrhythmias. Acute intervention is needed.  3.  Moderate aortic stenosis certainly contributing to increased cardiac      output and myocardial oxygen demand, again consistent with unstressed      non-Q-wave infarction.  4.  Sebaceous cysts with infection. Will consult surgery for possible      removal in house, depending on his cardiologic status and hematologic      status. GI will also be consulted again.      LJF/MEDQ  D:  01/31/2005  T:  01/31/2005   Job:  [id number]

## 2011-04-13 NOTE — H&P (Signed)
NAME:  Charles Faulkner, Charles Faulkner NO.:  [id number]   MEDICAL RECORD NO.:  [id number]            PATIENT TYPE:   LOCATION:                                 FACILITY:   PHYSICIAN:  Dalia Heading, M.D.  DATE OF BIRTH:  [birth year]   DATE OF ADMISSION:  DATE OF DISCHARGE:  LH                                HISTORY & PHYSICAL   CHIEF COMPLAINT:  Anemia of unknown etiology.   HISTORY OF PRESENT ILLNESS:  The patient is a 77 year old white male who was  referred for endoscopy.  He has had anemia of unknown etiology.  No fever,  chills, abdominal pain, melena, or hematochezia have been noted.  He did  have some diarrhea earlier, but now he feels constipated.  He has never had  a colonoscopy.  There is no family history of colon carcinoma.   PAST MEDICAL HISTORY:  1.  As noted above.  2.  Coronary artery disease.  3.  Hypertension.   PAST SURGICAL HISTORY:  CABG one year ago.   CURRENT MEDICATIONS:  1.  Iron supplements.  2.  Lipitor.  3.  Bayer aspirin.  4.  Niaspan.  5.  Hydrochlorothiazide.  6.  Prevacid.  7.  Tenormin.  8.  Isosorbide.   ALLERGIES:  NO KNOWN DRUG ALLERGIES.   REVIEW OF SYSTEMS:  Noncontributory.   PHYSICAL EXAMINATION:  GENERAL:  The patient is a well-developed, well-  nourished white male in no acute distress.  VITAL SIGNS:  He is afebrile, and vital signs are stable.  LUNGS:  Clear to auscultation with equal breath sounds bilaterally.  HEART:  Regular rate and rhythm without S3, S4, or murmurs.  ABDOMEN:  Soft, nontender, nondistended.  No hepatosplenomegaly or masses  are noted.  Rectal examination was deferred to the procedure.   IMPRESSION:  Anemia of unknown etiology.   PLAN:  The patient is scheduled for an EGD and colonoscopy on August 18, 2004.  The risks and benefits of the procedure, including bleeding and  perforation, were fully explained to the patient who gave informed consent.       ___________________________________________  Dalia Heading, M.D.    MAJ/MEDQ  D:  08/16/2004  T:  08/16/2004  Job:  [id number]   cc:   Kirk Ruths, M.D.  [address]  [place]  [place] [address]  Fax: [phone]

## 2011-04-13 NOTE — H&P (Signed)
NAME:  Charles Faulkner, Charles Faulkner NO.:  [id number]   MEDICAL RECORD NO.:  [id number]          PATIENT TYPE:  INP   LOCATION:  IC04                          FACILITY:  [facility]   PHYSICIAN:  Corrie Mckusick, M.D.  DATE OF BIRTH:  [birth year]   DATE OF ADMISSION:  02/21/2005  DATE OF DISCHARGE:  LH                                HISTORY & PHYSICAL   CONTINUATION   PHYSICAL EXAMINATION:  VITAL SIGNS:  T-max 97.0, blood pressure 103/60,  heart rate 64, respirations 18, O2 saturation 97% on room air.  GENERAL:  Pleasant gentleman, joking, no acute distress.  HEENT:  Moist mucous membranes.  NECK:  Supple.  CHEST:  Clear to auscultation.  CARDIOVASCULAR:  Normal S1 and S2.  No murmurs.  ABDOMEN:  Soft, nontender, nondistended.  EXTREMITIES:  No edema.   LABORATORY DATA:  Please see flow sheet for details.  Hematocrit is 33.5.   ASSESSMENT:  A 77 year old gentleman with coronary artery disease,  hypertension, recent GI bleed who presents with the same.   PLAN:  1.  Admit to ICU for close monitoring.  2.  Type and cross.  3.  Recheck CBC in 3 hours to make sure it has not dropped.  4.  Will watch for any further bleeding.  5.  Hold Plavix, aspirin.  6.  Hold hydrochlorothiazide, and hold Tenormin due to the hypertension.  7.  Will continue to follow closely and will get GI to see him again.      JCG/MEDQ  D:  02/21/2005  T:  02/21/2005  Job:  [id number]

## 2011-04-13 NOTE — Discharge Summary (Signed)
NAME:  Charles Faulkner, BOSTIC NO.:  [id number]   MEDICAL RECORD NO.:  [id number]          PATIENT TYPE:  INP   LOCATION:  3703                         FACILITY:  [facility]   PHYSICIAN:  Mont Dutton, MD        DATE OF BIRTH:  [birth year]   DATE OF ADMISSION:  08/21/2004  DATE OF DISCHARGE:  08/23/2004                                 DISCHARGE SUMMARY   DISCHARGE DIAGNOSES:  1.  Iron-deficiency anemia.  2.  Chest pain.  3.  History of coronary artery disease.  4.  Hypertension.  5.  Hyperlipidemia.   DISPOSITION:  The patient is discharged to home with instructions to return  for a Cardiolite study on Monday, August 28, 2004, at 10:30 a.m.  Following  that appointment, he will see Dr. Shon Baton Monday, August 28, 2004, at 2:30  p.m. and is to come to the clinic one hour early to have CBC with retic  count drawn at that time.   DISCHARGE MEDICATIONS:  1.  Ferrous sulfate 325 mg t.i.d.  2.  Tenormin 50 mg daily.  3.  Prevacid 50 mg daily.  4.  Lipitor 5 mg daily.  5.  Hydrochlorothiazide 12.5 mg daily.  6.  Isosorbide MN 30 mg daily.  7.  Niacin 1000 mg at night.  8.  Aspirin 81 mg daily.   CONSULTATION:  Madaline Savage, M.D., cardiology.   PROCEDURE:  None.   STUDIES:  1.  Echocardiogram  on 2004-09-12, showed a mildly dilated left      ventricle.  Left ventricular ejection fraction between 30 and 40% with      no evidence of regional wall motion abnormalities, also showed mild      aortic valve stenosis with mild aortic valvular regurgitation.  The      estimated aortic valve area was 1.23 sq cm.  There was also mild mitral      annular calcification with mild mitral valvular regurgitation.  Left      atrium was moderately dilated.  The right atrium was mildly dilated.  2.  On August 21, 2004, chest x-ray was performed which showed      cardiomegaly and early vascular congestion consistent with congestive      heart failure.  On August 23, 2004,  chest x-ray showed vascular      congestion with small bilateral pleural effusions, interval increased      since August 21, 2004.   HISTORY OF PRESENT ILLNESS:  The patient is a 77 year old white male with  past medical history of coronary artery disease, status post CABG in 2002,  anemia diagnosed in 2002, who presents with worsening left-sided shoulder,  arm, back and chest pain associated with shortness of breath, diaphoresis  and nausea.  Pain occurs with exertion and improves slowly down.  The  patient was admitted in April of 2005 with similar pain found to be anemic  at that time and started on iron pills.  His stress test at that time was  negative.  On August 18, 2004, the patient underwent colonoscopy and EGD  which were reportedly negative.   ALLERGIES:  No known drug allergies.   PHYSICAL EXAMINATION:  GENERAL APPEARANCE:  The patient appeared pale but  was in no acute distress.  No tachypnea.  VITAL SIGNS:  Blood pressure 117/64, pulse 88, respiratory rate 20,  saturating 98% on room air, temperature 96.7.  HEENT:  Eyes were with pupils are equal, round and reactive to light.  Extraocular muscles intact. ENT with moist mucous membranes.  Oropharynx  without exudate or erythema.  NECK:  Supple without JVD, with right greater than left carotid bruits.  LUNGS:  Bibasilar crackles at the bases.  CARDIOVASCULAR:  Regular rate and rhythm, 3/6 systolic ejection murmur heard  best at the base with radiation to the carotids but heard essentially  throughout precordium.  ABDOMEN:  Soft, nontender, nondistended with positive bowel sounds.  EXTREMITIES:  No edema.  No clubbing.  Skin was warm but pale.  LYMPHS:  No adenopathy.  NEUROLOGIC:  Alert and oriented.  Cranial nerves II-XII grossly intact.  Normal sensation.  Normal strength.   LABORATORY DATA ON ADMISSION:  Sodium 140, potassium 3.0, chloride 108,  bicarb 25, BUN 13, creatinine 0.8, glucose 181.  PT 14.5, INR 1.2,  PTT 27.  CK-MB 1.0, troponin less than 0.05.  CK-MB of 1.7.  Hemoglobin 6.5,  hematocrit 20.8, wbc 8.9, platelets 377, MCV 77, RDW 17.4.  Bilirubin 0.3,  alkaline phosphatase 84, SGOT 18, SGPT 15, protein 5.7, albumin 2.2, calcium  9.1.   HOSPITAL COURSE:  PROBLEM #1 -  ANEMIA: The patient was admitted with  hemoglobin of 6.  Ferritin, total iron, TIBC, retic count, lead levels were  ordered to evaluate microcytic anemia, although EGD and colonoscopy were  negative, the likely etiology of the anemia is GI loss as on the day of  discharge the patient and his wife described an incident a few days ago  where the patient saw a large amount of blood in the toilet after stooling.  It is thought that the patient may have an AVM which resulted in  intermittent GI losses of blood.  The patient was transfused four units of  blood with symptomatic improvement.  Hemoglobin at the time of discharge is  11.5.  The patient was discharged on increased dose of ferrous sulfate and  will be followed up in clinic on Monday, August 28, 2004.   PROBLEM #2 -  CHEST PAIN:  The patient was admitted with chest pain and  placed on telemetry given his significant cardiac history, second and third  set troponins were elevated at 0.1 trending down to 0.06.  The patient was  continued on all his cardiac medications and was stable hemodynamically.  He  was seen by cardiology and will be followed as an outpatient with a  Cardiolite stress test on Monday, August 28, 2004.  The patient was chest  pain-free throughout the hospital course and is discharge chest pain-free.   PROBLEM #3 -  CORONARY ARTERY DISEASE:  See above.  The patient was  continued on home medications and will be followed by cardiologist as an  outpatient.   PROBLEM #4 -  HYPERTENSION:  Blood pressure was stable throughout hospital  course.  Continue on outpatient medication.  LABORATORY DATA ON DISCHARGE:  Hemoglobin 1.5, hematocrit 35.0, white  blood  cell count 12.0, platelet count 293, MCV 79.  Sodium 139, potassium 4.4,  chloride 111, bicarb 24, glucose 119, BUN 11, creatinine 0.8, calcium 8.8.  TSH 3.113.  Retic count 2.9.  Ferritin 7.  BNP 492.6.  Iron less than 10,  TIBC not calculable.  Haptoglobin 189.  Lead level pending.       OB/MEDQ  D:  08/23/2004  T:  08/23/2004  Job:  [id number]

## 2011-04-13 NOTE — Discharge Summary (Signed)
NAME:  Charles Faulkner, Charles Faulkner NO.:  [id number]   MEDICAL RECORD NO.:  [id number]          PATIENT TYPE:  INP   LOCATION:  A204                          FACILITY:  [facility]   PHYSICIAN:  Corrie Mckusick, M.D.  DATE OF BIRTH:  Jan 25, 1926   DATE OF ADMISSION:  02/21/2005  DATE OF DISCHARGE:  04/01/2006LH                                 DISCHARGE SUMMARY   For History of Presenting Illness, Past Medical History please see admission  H&P.   HOSPITAL COURSE:  A 77 year old gentleman recently here for GI bleeding,  unable to find the source, who presented with the same. [name] has a  history of coronary artery disease, hypertension, was admitted to ICU, typed  and crossed and monitored CBCs closely. Aspirin and Plavix were held.  Atenolol and hydrochlorothiazide were held as well. GI was consulted once  again. Please see their note for details.   On the day after admission, the patient had an EGD, which was normal and  colonoscopy was done later as well on that day. This felt like this GI  bleeding was secondary to AVMs. The recommendation was to discontinue the  aspirin and Plavix. The patient remained stable. The patient was seen on the  following 2 days by my partners, as I was out of town. Hemoglobin and  hematocrit had remained stable and he is ready for discharge on April 1.   DISCHARGE MEDICATIONS:  Niaspan, iron, Tenormin and Lipitor. Aspirin and  Plavix were both stopped. He is going to follow-up with Dr. Mila Palmer later in  the week to recheck his CBC.   DISCHARGE CONDITION:  Improved and stable. Please see Dr. Renella Cunas note on  day of discharge for physical.       JCG/MEDQ  D:  03/07/2005  T:  03/07/2005  Job:  [id number]

## 2011-04-13 NOTE — Consult Note (Signed)
NAME:  Charles Faulkner, Charles Faulkner NO.:  [id number]   MEDICAL RECORD NO.:  [id number]          PATIENT TYPE:  OIB   LOCATION:  3707                         FACILITY:  [facility]   PHYSICIAN:  Genene Churn. Granfortuna, M.D.DATE OF BIRTH:  [birth year]   DATE OF CONSULTATION:  09/18/2004  DATE OF DISCHARGE:                                   CONSULTATION   REFERRING PHYSICIAN:  Dr. Nanetta Batty.   REASON FOR CONSULTATION:  This is a hematology consultation to evaluate this  man for severe iron deficiency anemia.   [name] is a very pleasant 77 year old man with longstanding coronary  artery disease, status post MI x2 in the past, status post angioplasty, 3  years status post five-vessel coronary bypass graft.  In addition, he has  advanced calcific aortic valvular disease, peripheral vascular disease, and  cerebrovascular disease.   He has a history of microcytic anemia, at least since March 15, 2004 when a  CBC was recorded with a hemoglobin of 8.5 and MCV of 65.  He had a recent  August 21, 2004 through August 23, 2004 admission for acute onset of  chest pain.  Hemoglobin was found to be 6.5 with serum iron less than 10 and  ferritin 7!  He was transfused 4 units of packed cells.  Stools were guaiac  negative.  He gave a history of recent upper and lower endoscopy done by Dr.  Dalia Heading in [place] on August 18, 2004.  These were reported to  him as normal.  These studies were done to evaluate an isolated episode of  hematochezia.   During the recent August 21, 2004 admission, he was started on Trinsicon  and iron 3 times daily.  Stools were guaiac-negative.  He had no history of  peptic ulcer disease.  No history of gastritis or diverticulitis.  No  alcohol use.   At time of discharge, his hemoglobin was 11.5 on August 23, 2004.   He was admitted at this time for an elective cardiac catheterization.  Hemoglobin on admission 11.5 with MCV 87.   He has  had no further episodes of hematochezia.  Review of his peripheral  blood film from today is unremarkable.  There is no evidence for a hemolytic  process.   PAST MEDICAL HISTORY:  Pertinent past history is as noted above, also  includes:  1.  Hypertension.  2.  Hyperlipidemia.  3.  Diet-controlled diabetes.  4.  Intermittent atrial arrhythmias in the past.  5.  MI in 1989, again in 1992.  6.  Angioplasty in 1992.  7.  Five-vessel CABG, March 04, 2001.   MEDICATIONS ON ADMISSION:  Medications on admission included:  1.  Trinsicon t.i.d.  2.  Lipitor 10 mg 1/2 daily.  3.  Isosorbide 30 mg daily.  4.  Hydrochlorothiazide 12.5 mg daily.  5.  Prevacid 15 mg daily.  6.  Aspirin 81 mg daily.  7.  Iron sulfate 325 mg t.i.d.  8.  Tenormin 50 mg daily.  9.  Diazepam 5 mg p.r.n.   ALLERGIES:  Apparent allergy to [facility] with  hives.   FAMILY HISTORY:  Family history is noncontributory.  No bleeding disorders.   SOCIAL HISTORY:  Married.  No children.  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  No  alcohol or tobacco.   PHYSICAL EXAMINATION:  GENERAL:  Elderly male who is hard of hearing.  VITALS ON ADMISSION:  Blood pressure 135/58, pulse of 61, respirations 18,  temperature 98.6.  HEENT:  There is inflammation around the right conjunctiva.  Pupils are  equal, reactive to light.  Pharynx:  No erythema or exudate.  NECK:  Neck is supple.  Carotids are 2+, symmetric.  There are bilateral  bruits.  LUNGS:  The lungs with rales at the bases.  CARDIAC:  There is a regular cardiac rhythm with a 2-3/6 high-pitched  midsystolic murmur heard best at the second right intercostal space that  radiates to the left sternal border.  There is no lymphadenopathy.  ABDOMEN:  The abdomen is soft and nontender.  There is an abdominal bruit,  no palpable mass or organomegaly.  RECTAL:  Rectal reveals a symmetrically enlarged prostate.  Stool  is black  from iron, guaiac testing is in progress.  EXTREMITIES:  Extremities with trace edema.  NEUROLOGIC:  Grossly normal.   IMPRESSION:  Severe iron deficiency with recent normal upper and lower  endoscopies.  He is currently maintaining his hemoglobin after a recent  transfusion and on full-dose iron replacement.   At present, the etiology of his iron deficiency is unclear.  He could be mal  absorbing iron.  Other possibilities would include small bowel pathology,  rule out arteriovenous malformation, Meckel's diverticulum, or tumor.   RECOMMENDATIONS:  With respect to any planned cardiac interventions or  surgery, I would just continue his iron and transfuse as necessary.  I am  going to give him 1 dose of IV iron to rapidly replete his bone marrow iron  stores.   At some point, he needs a small bowel evaluation.  If he is stable enough  for the cardiac standpoint to tolerate a tilt table, I would do an upper GI  with small-bowel study, otherwise, I would consult gastroenterology  regarding capsule endoscopy.   Thank you for this consultation.      Jame   JMG/MEDQ  D:  09/19/2004  T:  09/19/2004  Job:  [id number]   cc:   Nanetta Batty, M.D.  Fax: [phone]   Kirk Ruths, M.D.  [address]  [place]  [place] [address]  Fax: [phone]   Dalia Heading, M.D.  [address], [place]  [place] [address]  Fax: [phone]

## 2011-04-13 NOTE — H&P (Signed)
NAME:  Charles Faulkner, Charles Faulkner NO.:  [id number]   MEDICAL RECORD NO.:  [id number]          PATIENT TYPE:  INP   LOCATION:  IC04                          FACILITY:  [facility]   PHYSICIAN:  Corrie Mckusick, M.D.  DATE OF BIRTH:  1926-04-29   DATE OF ADMISSION:  02/21/2005  DATE OF DISCHARGE:  LH                                HISTORY & PHYSICAL   ADMISSION DIAGNOSIS:  GI bleeding.   HISTORY OF PRESENT ILLNESS:  A 77 year old gentleman recently here for GI  bleeding, unable to find source, who presents with the same.  He had two  episodes this morning of diffuse bleeding per rectum.  It was all bright in  nature, was not dark in any way.  He otherwise feels pretty well and has not  had any chest pain or shortness of breath.  He has not had palpitations,  which is what he presented with on the last visit.  Again, his hemoglobin  and hematocrit were quite stable on admission this time.  He does have a  significant past medical history of coronary artery disease, history of DVT  as well.   PAST MEDICAL HISTORY:  1.  Severe iron-deficiency anemia due to GI bleeding.  Please see recent H&P      for details.  2.  Coronary artery disease, status post MI, status post CABG.  3.  Hyperlipidemia.  4.  History of DVT.   PAST SURGICAL HISTORY:  CABG in April 2002.  MIs in 1989 and 1992.   SOCIAL HISTORY:  He does not drink nor smoke.   FAMILY HISTORY:  Noncontributory.   MEDICATIONS ON ADMISSION:  1.  Niaspan 1000 mg daily.  2.  Aspirin 325 mg daily, being held.  3.  Iron three times a day.  4.  Tenormin 25 mg q.a.m., 12.5 mg q.p.m.  5.  Plavix daily.   ALLERGIES:  LOPRESSOR.   PHYSICAL EXAMINATION:  Dictation ended at this point.      JCG/MEDQ  D:  02/21/2005  T:  02/21/2005  Job:  [id number]

## 2011-04-13 NOTE — Op Note (Signed)
NAME:  CHINEDUM, VANHOUTEN NO.:  [id number]   MEDICAL RECORD NO.:  [id number]          PATIENT TYPE:  INP   LOCATION:  3313                         FACILITY:  [facility]   PHYSICIAN:  Balinda Quails, M.D.    DATE OF BIRTH:  1926-08-23   DATE OF PROCEDURE:  05/17/2005  DATE OF DISCHARGE:                                 OPERATIVE REPORT   SURGEON:  P Bud Face, MD   ASSISTANT:  Fabienne Bruns, MD   ANESTHETIC:  General endotracheal.   ANESTHESIOLOGIST:  Kaylyn Layer. Ossey, MD   PREOPERATIVE DIAGNOSIS:  Severe right internal carotid artery stenosis.   POSTOPERATIVE DIAGNOSIS:  Severe right internal carotid artery stenosis.   PROCEDURE:  Right carotid endarterectomy and Dacron patch angioplasty.   CLINICAL NOTE:  Mr. Kolston Lacount is a 77 year old male referred by Dr.  [name] for evaluation of severe carotid disease.  Arteriography revealed a  high-grade left common carotid ostial stenosis.  He also had a severe  stenosis at the origin of the right internal carotid artery.  He has mild-to-  moderate aortic valvular stenosis.  He was seen and evaluated in the office.  Plan at this time is to perform right carotid endarterectomy for reduction  of stroke risk.  Future plan:  Probable stenting of his left common carotid  ostial lesion.   OPERATIVE PROCEDURE:  The patient was brought to the operating room in  stable condition.  Placed in a supine position.  General endotracheal  anesthesia induced.  Foley catheter and arterial line in place.  Right neck  prepped and draped in a sterile fashion.   Curvilinear skin incision made along the anterior border of the right  sternomastoid muscle.  Subcutaneous tissue and platysma divided with  electrocautery.  Deep dissection carried along the anterior border of the  sternomastoid to the carotid sheath.  Facial vein ligated with 2-0 silk and  divided.  The carotid bifurcation exposed.  The common carotid artery  mobilized down to  the omohyoid muscle and encircled with Vesseloop. There  was heavily calcified plaque present in the carotid bulb.  The superior  thyroid and external carotid were circled with Vesseloops.  The internal  carotid artery revealed plaque extending up through the origin.  The  internal carotid artery was mobilized up to the posterior belly of the  digastric muscle and encircled with a Vesseloops.  The vagus nerve and  hypoglossal nerve were both clearly identified and preserved.   The patient was administered 7000 units of heparin intravenously.  Adequate  circulation time permitted.  The carotid vessels were controlled with  clamps.  A longitudinal arteriotomy made in the distal common carotid  artery.  The arteriotomy extended across carotid bulb and up into the  internal carotid artery.  There was a large amount calcified plaque present.  A high-grade stenosis at the origin of the right internal carotid artery was  present.  A shunt was inserted.   The plaque was removed with an endarterectomy elevator.  The endarterectomy  carried down into the common carotid artery where the plaque was divided  transversely with Potts scissors.  The plaque then raised up into the bulb  where the superior thyroid and external carotid were endarterectomized using  the eversion technique.  The distal internal carotid artery plaque then  feathered out.  Fragments plaque were removed with plaque forceps.  The site  irrigated with heparin and saline and dextran solutions.   A patch angioplasty of the endarterectomy site was carried out with a  Finesse Dacron patch using running 6-0 Prolene suture.  At completion of the  patch angioplasty, the shunt was removed.  All vessels were well-flushed.  Clamps were removed, directing the initial antegrade flow up the external  carotid artery.  The internal carotid artery then released.  Excellent pulse  and Doppler signal present in the internal carotid artery.   The  patient was administered 50 mg of protamine intravenously.  Adequate  hemostasis was obtained.  Sponges and counts were correct.  Sternomastoid  fascia was closed a running 2-0 Vicryl suture.  Platysma was closed running  a 3-0 Vicryl suture.  Skin was closed with 4-0 Monocryl.  Steri-Strips  applied.   The patient tolerated the procedure well.  He was transferred to recovery  room in stable condition.  No apparent complications.       PGH/MEDQ  D:  05/17/2005  T:  05/18/2005  Job:  [id number]   cc:   Nanetta Batty, M.D.  Fax: [phone]

## 2011-04-13 NOTE — Cardiovascular Report (Signed)
NAME:  Charles Faulkner, Charles Faulkner NO.:  [id number]   MEDICAL RECORD NO.:  [id number]          PATIENT TYPE:  OIB   LOCATION:  6599                         FACILITY:  [facility]   PHYSICIAN:  Darlin Priestly, MD  DATE OF BIRTH:  01/09/1926   DATE OF PROCEDURE:  09/20/2004  DATE OF DISCHARGE:                              CARDIAC CATHETERIZATION   PROCEDURES:  1.  LIMA to LAD.  2.  SVG to RCA.      1.  Percutaneous insertion of coronary balloon angioplasty.      2.  Placement of intracoronary stent.   SURGEON:  Darlin Priestly, M.D.   COMPLICATIONS:  None.   INDICATIONS FOR PROCEDURE:  Charles Faulkner is a 77 year old male patient of  Charles Faulkner, M.D., and Charles Faulkner, M.D. with a history of CAD,  status post coronary artery bypass surgery  consisting of LIMA to LAD,  saphenous vein graft  to OM1 and OM2, and saphenous vein graft to the PDA.  The patient underwent cardiac catheterization on September 18, 2004, by Dr.  Allyson Faulkner revealing a patent LIMA to the LAD with a totally occluded vein graft  to OM1 and OM2.  The patient did have a 99% ostial vein graft to the RCA.  He is now brought for percutaneous intervention of the vein graft to the  RCA.   DESCRIPTION OF PROCEDURE:  After giving informed consent, the patient  brought to the cardiac catheterization lab and right and left groin is  shaved, prepped and draped in the usual sterile fashion.  ECG monitor  established.  Using modified Seldinger technique, a 7 French arterial sheath  inserted in the right femoral artery.  Next, a 6 [place] IMA diagnostic  catheters used to engage the IMA.   The LIMA appears to be widely patent, insertion to the mid portion of the  LAD which courses to the apex.  There does not appear to be any significant  disease in the IMA or distal to the IMA insertion.  There is left-to-left  collaterals to a lower obtuse marginal branch.   The IMA catheter was then exchanged for a 7 French RCB  guiding catheter with  side holes but we were unable to get this engaged in the ostium of the vein  graft to the RCA.  Multiple guiding catheters were then tried including a  JR4, WRP and we were ultimately successful in engaging the vein graft with a  6 [place] multipurpose guide catheter with side holes.  Angiogram confirmed a  99% ostial lesion in the vein graft, however, beyond the ostial lesion, the  vein graft appeared to be widely patent.  Next, a 0.014 ATW marker wires  advanced out of the guiding catheter positioned in the distal PDA without  difficulty.  Following this, a Maverick 3.0 x 15 mm balloon was tracked  across the ostial lesion and two inflations performed for a total of 8  atmospheres for a total of 43 seconds.  Follow-up angiogram revealed luminal  gain with no evidence of dissection or thrombus.  This balloon was then  exchanged for a CYPHER 3.5 x 13 mm stent which was then carefully positioned  across the ostial portion of the vein graft.  The stent was then employed to  14 atmospheres for a total of 30 seconds.  A second inflation then performed  to 16 atmospheres for a total of 35 seconds.  Follow-up angiogram revealed  no evidence of dissection or thrombus with TIMI-3 flow to the distal vessel.  The stent appeared to be under sized for the graft and we chose a PowerSail  4.0 x 8 mm balloon with two subsequent inflations to maximum of 18  atmospheres for a total of one minute six seconds.  Follow-up angiogram  revealed good luminal gain with no evidence of dissection or thrombus,  however, it was felt that the stent could be slightly more enlarged.  This  balloon was then exchanged fro a Quantum Ranger 4.5 x 8 mm balloon and two  inflations to maximum of 18 atmospheres performed for a total of one minute  three seconds.  Follow-up angiogram revealed no evidence of dissection or  thrombus with TIMI-3 flow to the distal vessel.  IV Angiomax was used  throughout the  case.   Final orthogonal angiograms revealed less than ____10%___ stenosis in the  ostial portion of the vein graft to the right coronary artery with TIMI-3  flow to the distal vessel.  At this point, we elected to conclude the  procedure.  All balloons, ____wires and catheters were removed.  Hemostasis  sheaths were sewn in place.  The patient transferred back to the ward in  stable condition.   CONCLUSION:  1.  Successful placement of a CYPHER 3.5 x 13 mm stent in the ostial vein      graft to the right coronary artery, ultimately post dilated to 4.68 mm.  2.  Patent left internal mammary artery to the left anterior descending with      left-to-left collaterals to the lower obtuse marginal.  3.  Adjunct use Angiomax infusion.      Robe   RHM/MEDQ  D:  09/20/2004  T:  09/20/2004  Job:  [id number]   cc:   Charles Rear. Sherwood Gambler, MD  [address]  [place]  [place] [address]  Fax: [phone]

## 2011-04-13 NOTE — Consult Note (Signed)
NAME:  BERNIS, SCHREUR NO.:  [id number]   MEDICAL RECORD NO.:  [id number]          PATIENT TYPE:  INP   LOCATION:  IC04                          FACILITY:  [facility]   PHYSICIAN:  R. Roetta Sessions, M.D. DATE OF BIRTH:  1926-09-10   DATE OF CONSULTATION:  02/21/2005  DATE OF DISCHARGE:                                   CONSULTATION   REASON FOR CONSULTATION:  GI bleed.   HISTORY OF PRESENT ILLNESS:  Mr. Genther is a pleasant 77 year old gentleman  with a well-documented history of iron-deficiency anemia and recurrent GI  bleeding.  He underwent EGD and colonoscopy last year by Dr. Lovell Sheehan, which  was reported to be negative.  We saw him earlier this year and performed a  capsule endoscopy, which revealed the possibility of a couple of nonbleeding  leiomyomas in the small bowel.  Consultation was again requested by our  service earlier this month.  Dr. Karilyn Cota saw Mr. Franchini on January 31, 2005, for  recurrent bleeding.  No diagnostic maneuvers were employed during his last  hospitalization.  He tells me he went home on February 01, 2005, and did well  until early this morning, when he started having painless gross blood per  rectum.  Stools have been chronically dark on iron therapy.  He has not been  taking any aspirin; however, he has been on Plavix since his discharge.  No  abdominal pain, no melena.  No hematemesis, early satiety, reflux symptom,  nausea or vomiting.  Weight has been stable per report.  Admission H&H 11.3  and 33.5, at 12 o'clock today 10.4 and 30.3, platelet count 332,000, white  count 10.  INR 1.1.  Electrolytes looked good, see below.  He has remained  hemodynamically stable.   MEDICATIONS ON ADMISSION:  Tenormin, Lipitor, hydrochlorothiazide, Prevacid,  Plavix, isosorbide, ferrous sulfate.   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   PAST MEDICAL HISTORY:  1.  Hypertension.  2.  Coronary artery disease, status post two MIs, status post five-vessel  coronary artery bypass graft with subsequent stenting.  3.  Hypercholesterolemia.  4.  History of iron-deficiency anemia.  5.  Chronic GI bleed.  6.  History of diabetes mellitus, diet-controlled.  7.  History of skin cancer.   FAMILY HISTORY:  No chronic GI or liver disease.   SOCIAL HISTORY:  The patient is married and lives with his wife.  He is a  former smoker.  No alcohol use.  He denies any over-the-counter agents.   He has had no recent chest pain, dyspnea on exertion, no fever or chills.   PHYSICAL EXAMINATION:  GENERAL:  A pleasant, elderly gentleman who is alert  and conversant, in no acute distress.  He is seen in the ICU bed 4.  VITAL SIGNS:  Blood pressure 128/37, pulse 60, respiratory rate 18, O2  saturation 100% on 4 L.  SKIN:  Warm and dry.  No jaundice, no cutaneous stigmata of chronic liver  disease.  HEENT:  No scleral icterus.  Conjunctivae are pink.  Oral cavity:  No  lesions.  CHEST:  Lungs are  clear to auscultation.  CARDIAC:  Regular rate and rhythm without murmur, gallop, or rub.  ABDOMEN:  Nondistended, positive bowel sounds.  Soft, nontender, without  appreciable mass or organomegaly.   White count at 12 noon 10,000.  Sodium 140, potassium 3.8, chloride 111, CO2  24, glucose 176, BUN 16, creatinine 0.8.  Bilirubin 0.5, alkaline  phosphatase 79, AST 26, ALT 14, total protein 5.9, albumin 3.3, calcium 9.2.   IMPRESSION:  Mr. Broox Lonigro is a 77 year old gentleman with a well-  documented iron-deficiency anemia in a setting of recurrent GI bleeding.  He  is hemodynamically stable.  The etiology of bleeding is not well-defined at  all at this time.  Reported negative EGD and colonoscopy last year, two  questionable abnormalities in the small bowel on capsule endoscopy, not  inconsistent with leiomyomas.  He has been taking Plavix.   At this time, I recommend he have a re-look at his upper GI tract via EGD  and go ahead and get as much of an enteroscopy  as possible at the time of  the EGD to look at his more distal small bowel.  The potential risks,  benefits, and alternatives have been reviewed with the patient and Ms.  Alona Bene.  Will make further recommendations as far as further studies are  concerned pending the results of EGD with enteroscopy later today.  Agree  with holding Plavix for the time being and continuing Prevacid.  Further  recommendations to follow.   I would like to thank Dr. Geanie Cooley for allowing me to see this nice  gentleman today.      RMR/MEDQ  D:  02/21/2005  T:  02/21/2005  Job:  [id number]   cc:   Corrie Mckusick, M.D.  Fax: [phone]

## 2011-04-13 NOTE — Op Note (Signed)
NAME:  Charles Faulkner, BRINING NO.:  [id number]   MEDICAL RECORD NO.:  [id number]          PATIENT TYPE:  INP   LOCATION:  IC04                          FACILITY:  [facility]   PHYSICIAN:  R. Roetta Sessions, M.D. DATE OF BIRTH:  1926-05-18   DATE OF PROCEDURE:  02/22/2005  DATE OF DISCHARGE:                                 OPERATIVE REPORT   PROCEDURE:  Colonoscopy with ileoscopy.   INDICATION FOR PROCEDURE:  The patient is a 77 year old gentleman with iron-  deficiency anemia, recurrent GI bleeding on Plavix, admitted to the hospital  with same.  EGD yesterday included enteroscopy well into the jejunum and no  blood was found in the upper GI tract.  The mucosa of the GI tract well into  the jejunum was felt to have been seen.  The mucosa appeared normal.  He has  been stable overnight.  His hemoglobin is stable at 10.2.  He is undergoing  colonoscopy at this time.  This approach has been discussed with the patient  and the patient's wife at length.  The potential risks, benefits, and  alternatives have been reviewed, questions answered, he is agreeable.  Please see documentation in the medical record.   PROCEDURE NOTE:  O2 saturation, blood pressure, pulse, and respiration were  monitored throughout the entire procedure.   CONSCIOUS SEDATION:  IV Versed and Demerol in incremental doses.   INSTRUMENT USED:  Olympus video chip system.   FINDINGS:  Digital rectal examination revealed no abnormalities.  Endoscopic  findings:  Prep was good.   Rectum:  Examination of the rectal mucosa including retroflexed view of the  anal verge revealed only internal hemorrhoids.   Colon:  The colonic mucosa was surveyed from the rectosigmoid junction  through the left, transverse and right colon to the area of the appendiceal  orifice, ileocecal valve and cecum.  These structures were well-seen and  photographed for the record.  The terminal ileum was intubated to 10 cm.  From this level  the scope was slowly withdrawn and once again all previously-  mentioned mucosal surfaces were again seen.  The colonic mucosa appeared  normal.  The ileal mucosa appeared normal.  There was no blood in the lower  GI tract.  The patient tolerated the procedure well and was reacted in  endoscopy.   IMPRESSION:  1.  Internal hemorrhoids, otherwise normal rectum.  2.  Normal colon and terminal ileum.  3.  I suspect the patient has bled from occult arteriovenous malformations.  4.  It appears that the risk of ASA/antiplatelet therapy probably outweighs      any benefit.   RECOMMENDATIONS:  1.  Would discontinue both aspirin and Plavix.  2.  Advance diet.  3.  Check CBC tomorrow morning.      RMR/MEDQ  D:  02/22/2005  T:  02/22/2005  Job:  [id number]   cc:   Corrie Mckusick, M.D.  Fax: [phone]

## 2011-04-13 NOTE — Discharge Summary (Signed)
NAME:  Charles Faulkner, Charles Faulkner NO.:  [id number]   MEDICAL RECORD NO.:  [id number]          PATIENT TYPE:  INP   LOCATION:  A206                          FACILITY:  [facility]   PHYSICIAN:  Madelin Rear. Sherwood Gambler, MD  DATE OF BIRTH:  02-03-1926   DATE OF ADMISSION:  01/31/2005  DATE OF DISCHARGE:  03/09/2006LH                                 DISCHARGE SUMMARY   DISCHARGE DIAGNOSIS:  1.  Hyperlipidemia.  2.  Iron-deficiency anemia with history of chronic gastrointestinal bleed.  3.  Coronary artery disease.  4.  Type 2 diabetes mellitus.   DISCHARGE MEDICATIONS:  1.  Plavix.  2.  Aspirin.  3.  Tenormin.  4.  Lipitor.  5.  Prevacid.  6.  Iron.   HOSPITAL COURSE:  The patient was admitted with chest pain, palpitations,  and near syncope, or pre-syncopal type symptoms.  He presented to the  emergency department after having several episodes of bloody diarrhea  associated with this.  He had no hematemesis.  No abdominal pain, no fever,  rigors, chills, and no other cardiopulmonary symptoms.  He was seen in  consultation by gastroenterology who concurred with management.  C.  difficile colitis was ruled out with appropriate laboratories.  H&H was  stabilized and total colonoscopy was planned as an outpatient by GI.      LJF/MEDQ  D:  02/15/2005  T:  02/15/2005  Job:  [id number]

## 2011-04-13 NOTE — Cardiovascular Report (Signed)
[place]  Patient:    Charles Faulkner, Charles Faulkner                      MRN: [id number] Proc. Date: 03/11/01 Adm. Date:  [id number] Attending:  Berry, Jonathan [place] CC:         Cardiac Catheterization Laboratory  [facility], [address] [place], [place] [address]   Cardiac Catheterization  PROCEDURES PERFORMED:  Cardiac catheterization.  INDICATIONS:  Mr. [name] is a 77 year old, white male, with a history of CAD and PVOD.  He had an MI in 1989 and again in 1992.  He underwent PTCA of his RCA in 1992.  He has had documented carotid disease and peripheral vascular occlusive disease.  He has had progressive chest pain and a recent positive Cardiolite.  He presents now for diagnostic coronary arteriography.  DESCRIPTION OF PROCEDURE:  The patient was brought to the second floor cardiac catheterization lab in the postabsorptive state.  He was premedicated with p.o. Valium.  His right groin was prepped and shaved in the usual sterile fashion.  Xylocaine 1% was used for local anesthesia.  A 6 French sheath was inserted into the right femoral artery using standard Seldinger technique.  A 6 French right and left Judkins diagnostic catheter, as well as a 6 French pigtail catheter were used for selective coronary angiography, left ventriculography, subselective left internal mammary artery angiography, and distal abdominal aortography.  Omnipaque dye was used for the entirety of the case.  Retrograde, aortic, left ventricular, and pullback pressures were recorded.  HEMODYNAMICS: 1. Aortic systolic pressure 133, diastolic pressure 41. 2. Left ventricular systolic pressure 135 and diastolic pressure 34.  SELECTIVE CORONARY ANGIOGRAPHY: 1. Left main:  Normal. 2. Left anterior descending:  The LAD had an 80-90% stenosis in its proximal    portion just after the takeoff of the first moderate sized diagonal branch.    The diagonal  branch was itself diseased with a segmental 80% proximal    stenosis. 3. Left circumflex:  There is an 80% stenosis in the AV groove circumflex    after the takeoff of the first large bifurcating marginal branch which was    in the ramus distribution.  There is a 90% segmental proximal OM-1    stenosis. 4. Right coronary artery:  This vessel was occluded in its midportion and    received left to right collaterals.  LEFT VENTRICULOGRAPHY:  RAO left ventriculogram was performed using 25 cc of Omnipaque dye at 12 cc/sec.  The overall LVEF was estimated at approximately 50% with mild inferobasal hypokinesis.  LEFT INTERNAL MAMMARY ARTERY:  This vessel is subselectively visualized and is widely patent.  It is suitable for use during coronary artery bypass grafting.  DISTAL ABDOMINAL AORTOGRAPHY:  Distal abdominal aortogram was performed using @) cc of Omnipaque dye at 20 cc/sec.  The renal arteries are widely patent. The infrarenal abdominal aorta and iliac bifurcation appear free of significant atherosclerotic changes.  IMPRESSION:  [name] has three-vessel disease, accelerated angina and a positive Cardiolite stress test.  He will need coronary artery bypass grafting for complete revascularization.  The sheaths were removed and pressure was held on the groin to achieve hemostasis.  The patient left the lab in stable condition. DD:  03/11/01 TD:  03/12/01 Job: [id number]

## 2011-04-13 NOTE — Discharge Summary (Signed)
NAME:  Charles Faulkner, Charles Faulkner NO.:  [id number]   MEDICAL RECORD NO.:  [id number]          PATIENT TYPE:  INP   LOCATION:  3313                         FACILITY:  [facility]   PHYSICIAN:  Balinda Quails, M.D.    DATE OF BIRTH:  1926/05/02   DATE OF ADMISSION:  05/17/2005  DATE OF DISCHARGE:  05/18/2005                                 DISCHARGE SUMMARY   HISTORY OF THE PRESENT ILLNESS:  The patient is a 77 year old male referred  for evaluation and management of extracranial cerebrovascular occlusive  disease.  Recent Doppler evaluation revealed a severe right internal carotid  artery stenosis and moderate left internal carotid artery stenosis.  Cerebral arteriography verified  a high-grade right internal carotid artery  stenosis at the bifurcation and an ostial left common carotid artery  stenosis estimated at 80-85%.  The patient denies any history of documented  stroke; and, no symptoms of visual, sensory or motor deficits.  He is  admitted this hospitalization for a right carotid endarterectomy per Dr. Denman George.   PAST MEDICAL HISTORY:  1.  Hypertension.  2.  Hyperlipidemia.  3.  Coronary artery disease status post coronary artery bypass grafting in      2002.  4.  Mild aortic stenosis.   MEDICATIONS:  The medications prior to admission are:  1.  Tenormin 40 mg daily.  2.  Plavix 75 mg daily.  3.  Hydrochlorothiazide 25 mg daily.  4.  Prevacid 15 mg daily.  5.  Niaspan 500 mg daily.  6.  Isosorbide 60 mg daily.  7.  Lipitor 40 mg daily.  8.  Ferrous sulfate 324 mg daily,   ALLERGIES:  LOPRESSOR.   FAMILY HISTORY:  Please see the history and physical done at the time of  admission.   SOCIAL HISTORY:  Please see the history and physical done at the time of  admission.   REVIEW OF SYSTEMS:  Please see the history and physical done at the time of  admission.   PHYSICAL EXAMINATION:  Please see the history and physical done at the time  of  admission.Marland Kitchen   HOSPITAL COURSE:  The patient was admitted electively and on May 17, 2005  he was taken to the operating room where he underwent right carotid  endarterectomy by Dr. Madilyn Fireman.  The patient tolerated the procedure well and  was taken to the post-anesthesia care unit in stable condition.   Postoperative Hospital Course:  The patient did well.  He has remained  hemodynamically stable.  His incision is healing well.  Laboratory values  are stable.  He did require some potassium replacement for a potassium level  of 3.2.  His neurological status is unchanged and intact.  His energy level  is overall tolerable of activity and commensurate for the level of  postoperative convalescence.  Overall, he is felt to be stable for discharge  on today's date, May 18, 2005.   MEDICATIONS:  The medications on discharge are the same as those  preoperatively, and for pain he is to take Tylox 1-2 q.4-6 hours as needed.  DISCHARGE INSTRUCTIONS:  The patient received written instructions regarding  medications, activity, diet, wound care, and follow up.   FOLLOW UP:  Follow up will include Dr. Madilyn Fireman in two weeks; the office will  call with an appointment.   DISCHARGE DIAGNOSES:  Severe extracranial cerebrovascular occlusive disease  as described now status post right carotid endarterectomy.   OTHER DIAGNOSES:  1.  Hypertension.  2.  Hyperlipidemia.  3.  Coronary artery disease status post coronary artery bypass grafting in      2002.  4.  Mild aortic stenosis.   DISCHARGE CONDITION:  The condition on discharge is stable and improve.       WEG/MEDQ  D:  05/18/2005  T:  05/19/2005  Job:  [id number]   cc:   Cardiovascular Thoracic Surgery Office   Nanetta Batty, M.D.  Fax: [phone]   Madelin Rear. Sherwood Gambler, MD  [address]  [place]  [place] [address]  Fax: [phone]

## 2011-04-13 NOTE — Discharge Summary (Signed)
[place]  Patient:    Charles Faulkner, Charles Faulkner                      MRN: [id number] Adm. Date:  [id number] Disc. Date: [id number] Attending:  Charlett Lango Dictator:   Adair Patter, P.A. CC:         Luciana Axe, M.D.  [name], M.D.   Discharge Summary  ADMISSION DIAGNOSIS:  Coronary artery disease.  SECONDARY DIAGNOSES: 1. Peripheral vascular disease. 2. Hypertension. 3. Hypercholesterolemia. 4. Diet-controlled diabetes mellitus. 5. History of atrial flutter. 6. History of myocardial infarction.  DISCHARGE DIAGNOSIS:  Coronary artery disease.  PROCEDURES:  Coronary bypass graft x 5.  HOSPITAL COURSE:  [name] was admitted to [facility] on March 14, 2001, secondary to known coronary artery disease.  On this date he underwent elective coronary bypass graft x 5 with the left internal mammary artery anastomosed to the left anterior descending artery, saphenous vein graft to the posterior descending artery, saphenous vein graft to the diagonal artery, and a saphenous vein graft sequentially anastomosed to obtuse marginal-1 and 2.  The procedure was done by Dr. Charlett Lango under general endotracheal anesthesia.  No complications were noted during the procedure. Postoperatively, the patient had uneventful hospital course.  He did have an occurrence of atrial flutter postoperative; however, this was controlled with digoxin.  Excellent rate control was achieved.  He was subsequently discharged home in stable condition on March 20, 2001.  MEDICATIONS AT TIME OF DISCHARGE: 1. Tylox one to two tablets q.4-6h. p.r.n. pain. 2. Aspirin 325 mg one tablet q.d. 3. Tenormin 25 mg one tablet q.d. 4. Niferex 150 one tablet b.i.d. 5. Colace 100 mg one tablet b.i.d. 6. Digoxin 0.125 mg one tablet q.d. 7. Lipitor 5 mg one tablet q.d.  DISCHARGE INSTRUCTIONS:  The patient told to avoid driving, strenuous activity, and lifting  objects over 10 pounds.  Diet:  Low fat, low salt. Wound care:  The patient was told that he could shower and clean his incision with soap and water.  DISPOSITION:  Home.  DISCHARGE FOLLOWUP:  The patient told to call his cardiologist, Dr. [name], for appointment in two weeks.  Told to bring chest x-ray to Dr. Rondel Jumbo office with him.  He was also told to follow up with Dr. Dorris Fetch at the [facility] office on Wednesday, May 15 at 9:30 a.m. DD:  03/19/01 TD:  03/20/01 Job: [id number]

## 2011-04-13 NOTE — Consult Note (Signed)
NAME:  Charles Faulkner, Charles Faulkner NO.:  [id number]   MEDICAL RECORD NO.:  [id number]          PATIENT TYPE:  INP   LOCATION:  A206                          FACILITY:  [facility]   PHYSICIAN:  Lionel December, M.D.    DATE OF BIRTH:  [birth year]   DATE OF CONSULTATION:  01/31/2005  DATE OF DISCHARGE:                                   CONSULTATION   REASON FOR CONSULTATION:  Anemia, hematochezia.   HISTORY OF PRESENT ILLNESS:  The patient is a 77 year old Caucasian  gentleman who was admitted with bloody diarrhea with subsequent diaphoresis,  light headedness, chest pain and palpitations. He has a history of iron-  deficiency anemia secondary to intermittent GI bleed for which he is on  chronic oral iron therapy. He tells me he was hospitalized for 4 or 5 days  last year and received 4 units of blood due to anemia. He has been seen by  Dr. [name] in the past. He had an EDG and colonoscopy last year by Dr.  Franky Macho which was reportedly negative. He subsequently has had a small  bowel capsule study which revealed two possible non bleeding leiomyomas in  the small bowel.  He had been on Plavix and aspirin and recommendations were  given for him to stop at least the aspirin if possible and if he had any  recurrent bleeding that he may need to have a repeat endoscopy. The patient  tells me he has intermittent bouts of bloody diarrhea which last for less  than 24 hours. He denies any associated pain. The hematochezia generally is  fresh. He denies any blood clots. In between he is completely asymptomatic.  He denies any nausea or vomiting, acid reflux symptoms. His stools are dark  on iron. He recently was treated with antibiotics for a skin lesion on his  face.   On admission his hemoglobin was 8.1, hematocrit 24.1, MCV 93.7. In October  his hemoglobin got up to 11. INR is 1.1, potassium 2.6, glucose 183. Today  his hemoglobin is down to 7.7. He is just receiving his first unit  of packed  red blood cells. His potassium is up to 4. Cardiac enzymes reveal a CK-MB of  1, then 1.4, then 1.45, troponin less than 0.05, and 1.3.   Chest x-ray reveals cardiomegaly with vascular congestion.   MEDICATIONS PRIOR TO ADMISSION:  1.  Plavix.  2.  Aspirin.  3.  Tenormin.  4.  Lipitor.  5.  Prevacid.  6.  Iron.   ALLERGIES:  The patient cannot recall medication, however according to prior  medical record, he had hives on LOPRESSOR.   PAST MEDICAL HISTORY:  1.  Hypertension.  2.  Coronary artery disease status post at least two myocardial infarctions,      initially in 1989. He has had unstable angina with positive Cardiolite      which prompted a catheterization in October, 2005 which revealed 95%      stenosis of the ostial saphenous vein graft to the right coronary      artery. This was stented. He previously had  had 5-vessel coronary artery      bypass in 2002. Echocardiogram previously revealed moderate to severe      aortic stenosis. He has a history of atrial arrhythmias in the past.  3.  Hypercholesterolemia.  4.  History of iron-deficiency anemia with history of chronic GI bleed as      outlined above.  5.  Diet controlled diabetes mellitus.  6.  History of skin cancer.   FAMILY HISTORY:  Negative for colorectal cancer, chronic GI illnesses.   SOCIAL HISTORY:  He is married and lives with his wife. He has no children.  He quit smoking in 1989 after a 40 year history of tobacco use. Denies any  alcohol use.   REVIEW OF SYSTEMS:  See history of present illness for GI and  cardiopulmonary.   PHYSICAL EXAMINATION:  VITAL SIGNS:  Height 68 inches, weight 175.8.  Temperature 97.7, pulse 92, respirations 20, blood pressure 123/60.  GENERAL:  A pleasant, well-developed, well-nourished, elderly Caucasian male  in no acute distress. He is somewhat hard of hearing.  SKIN:  Warm and dry, no jaundice.  HEENT:  Conjunctivae are pale, sclerae anicteric. Oropharyngeal  appears  moist and patent. No lesions, erythema or exudate.  NECK:  No lymphadenopathy or thyromegaly.  CHEST:  Clear to auscultation.  CARDIAC EXAM: Reveals regular rate and rhythm, 3 out of 6 harsh soft  ejection murmur heard best throughout the precordium.  ABDOMEN:  Positive bowel sounds, soft, nontender, nondistended. No  organomegaly or masses.  EXTREMITIES:  Trace pedal edema bilaterally.   LABORATORY DATA:  As mentioned in the HPI, in addition, white count 9500,  platelets 271,000. Sodium 142, BUN 12, creatinine 0.7, glucose 137. PTT 26.   IMPRESSION:  The patient is a 77 year old Caucasian gentleman with history  of iron-deficiency anemia secondary to chronic/intermittent GI bleed. He was  admitted with a recurrent acute onset of bloody diarrhea similar to his  previous episodes. Unfortunately he has not had discreet diagnosis to  explain the cause of his bleeding. Prior small bowel Given's capsule  revealed two non bleeding possible leiomyomas but otherwise no other  findings. He remains on Plavix and aspirin. I doubt that we are dealing with  Clostridium difficile colitis given that his symptoms have completely  resolved after only 24 hours. However I do agree with checking, given his  recent antibiotic use. Given that he has had significant bleeds requiring  transfusions, and he presents with symptomatic anemia, at this point would  recommend repeat colonoscopy for bloody diarrhea.   PLAN:  1.  Will discuss further with Dr. Loreta Ave. Consider proceeding with repeat      colonoscopy.  2.  Transfuse as needed.  3.  Further recommendations to follow.      LL/MEDQ  D:  01/31/2005  T:  01/31/2005  Job:  [id number]

## 2011-04-13 NOTE — Discharge Summary (Signed)
NAME:  Charles Faulkner, Charles Faulkner NO.:  [id number]   MEDICAL RECORD NO.:  [id number]          PATIENT TYPE:  INP   LOCATION:  6533                         FACILITY:  [facility]   PHYSICIAN:  Nanetta Batty, M.D.   DATE OF BIRTH:  Sep 27, 1926   DATE OF ADMISSION:  09/18/2004  DATE OF DISCHARGE:  09/21/2004                                 DISCHARGE SUMMARY   DISCHARGE DIAGNOSES:  1.  Unstable angina.      1.  Positive Cardiolite study.  2.  Coronary artery disease.      1.  Bypass grafting x5 in April 2002.      2.  Heart catheterization September 18, 2004 with 95% ostial saphenous          vein graft to the right coronary artery stenosis.  Left internal          mammary artery was patent.  Moderate aortic insufficiency.      3.  Percutaneous transluminal coronary angioplasty and Cypher stent to          the saphenous vein graft to the right coronary artery by Dr. [name].  3.  Small abdominal aortic aneurysm.  4.  Anemia multifactorial.  History of gastrointestinal bleed with negative      colonoscopy and severe iron deficiency.      1.  IV iron was given in the hospital by hematology.  The patient was          placed on p.o. iron.      2.  Possible capsule endoscopy by gastrointestinal physician in          [place], Dr. Lovell Sheehan.  5.  Hyperlipidemia.  6.  Hypertension.  7.  Moderate to severe aortic stenosis.   Discharge condition improved.   PROCEDURE:  1.  September 18, 2004 combined left heart cath with graft visualization by      Dr. Nanetta Batty.  2.  September 20, 2004  PTA and drug-eluting Cypher stent to the saphenous      vein graft to the RCA at the ostium.   DISCHARGE MEDICATIONS:  1.  Enteric coated aspirin 81 mg daily.  2.  Lipitor 10 mg every evening.  3.  Imdur 60 mg 1/2 tablet daily.  4.  Hydrochlorothiazide 25 mg 1/2 tab daily.  5.  Prevacid 15 mg daily.  6.  Niaspan 500 mg two tabs at  bedtime.  7.  Tenormin 50 mg daily.  8.  Ferrous  sulfate 324 mg one three times daily.  9.  Plavix 75 mg one daily take with food.   DISCHARGE INSTRUCTIONS:  1.  No strenuous activity for one week.  2.  Low fat, low salt diet.  3.  Wash cath site with soap and water.  Call if any bleeding, swelling or      drainage.  4.  Have blood work done on Monday at Dr. Sharyon Medicus office.  5.  Followup with GI physician in [place], Dr. Lovell Sheehan or Dr. Sherwood Gambler for      plans for  capsule endoscopy.  6.  Follow with Dr. [name] October 05, 2004 at 11:45.  7.  Follow up with Dr. [name].  Their office will call you with a date      and time of that appointment.   HISTORY OF PRESENT ILLNESS:  A 77 year old male of Dr. Hazle Coca with history  of coronary disease including bypass grafting x5 in April 2002.  Previous  Cardiolite in April 2005 showed an anterior apical scar and no ischemia  although he did have Persantine-induced ST segment depression.  Also has  moderate to severe AS with valve area of 1.1 cm sq and mild aortic  insufficiency, EF was 40-50%.   Prior to his office visit August 31, 2004, the patient had been admitted to  [facility] on September 26 with acute anemia.  His hemoglobin was low.  He was  given transfused blood 2 units of packed cells.  He did have some anterior  lateral ischemia that was different from his last EKG so it was felt he  should repeat a Cardiolite study.  He came in for the Cardiolite study which  was indeed positive for ischemia with mild inferior lateral ischemia as well  as anterior lateral wall ischemia extending from the base to the apex.  He  was seen in the office and set up for outpatient cardiac catheterization.   PAST MEDICAL HISTORY:  Cardiac as described.  Otherwise, bilateral internal  carotid artery stenosis, right greater than left followed on a semi-annual  basis in our office. He has anemia followed by Dr. Sherwood Gambler.  Has had a  colonoscopy and EGD by Dr. Lovell Sheehan and then afterwards he had bright blood   in the toilet.   OTHER HISTORY:  Hyperlipidemia, hypertension, and the anemia.   Family history, social history, review of systems, see H&P.   PHYSICAL EXAM AT DISCHARGE:  VITAL SIGNS:  Blood pressure 110/42, pulse 74,  respirations 18, afebrile, max temperature had been 99.4.  LUNGS:  Clear.  HEART:  Regular rate and rhythm.  ABDOMEN:  Positive bowel sounds.  Do not palpate liver, spleen or masses.  EXTREMITIES:  Without edema.  Right groin cath site was stable, no bruit.   The patient was in sinus rhythm.   LABORATORY DATA:  Admitting labs:  Hemoglobin 11.5, hematocrit 34.5, WBCs  7.1, platelets 277, neutrophils 70, lymphs 20, mono 9, eosinophils 1,  basophils 0.  He has remained essentially unchanged during hospitalization.  Occult blood was positive.  Retic percent was 3.2, RBC 3.99, retic abs 127.  Sodium 139, potassium 3.4.  On admission it was repleted.  Chloride 108, CO2  24, glucose 168, BUN 10, creatinine 0.7, calcium 8.7, total protein 5.6,  albumin 2.9, AST 21, ALT 16, ALP 71, total bili 0.7, LDH 160.  These  remained stable.  His glucose actually came down to 91 prior to discharge  and his potassium 3.7.   CARDIAC ENZYMES ON POST PROCEDURE:  Post stent CK 24, MB 1.9, and troponin I  0.04.  UA was clear.   Chest x-ray on admission stable cardiomegaly, mild vascular congestion, mild  peribronchial thickening.   EKGS POST PROCEDURE:  Sinus bradycardia, first-degree AV block, heart rate  54, possible inferior infarction age 31.  ST-T wave  abnormalities, consider lateral ischemia.  Followup on October 27, sinus  rhythm, first-degree AV block,  PR is 206 msec, incomplete right bundle  branch block, T-wave abnormality, consider lateral ischemia.  EKG was  actually improved on the 27th.  Please note the PR interval on the 26th EKG  was 222 msec.   HOSPITAL COURSE:  Mr. Haisley was brought in by Dr. [name] through outpatient September 18, 2004 for cardiac  catheterization to evaluate coronary arteries  after unstable angina, abnormal EKG and a Cardiolite study that was positive  for ischemia revealed patent LIMA, severe native coronary disease with  bypass grafting and a 95% ostial RCA stenosis.  Unfortunately, plans had  been made to undergo stenting to that vessel on the 25th, but due to several  emergencies in the hospital that was not able to be completed so it was set  up for the 26th.  In the meantime, we did have hematology evaluate patient  for his anemia.  He got one dose of IV iron to give rapid repletion of  his  decreased iron source, continue oral iron and Dr. [name] felt that he  should either have small bowel GI series if he could tolerate his tilt test,  otherwise he recommended capsule endoscopy.   The patient remained stable.  On the 26th, he underwent intervention to that  saphenous vein graft and tolerated that successfully.   By September 21, 2004 he was stable, ambulating without problems and was ready  for discharge.  Dr. Domingo Sep saw him and discharged him home.  He will  follow up as previously instructed.      LRI/MEDQ  D:  10/23/2004  T:  10/23/2004  Job:  [id number]   cc:   Madelin Rear. Sherwood Gambler, MD  [address]  [place]  [place] [address]  Fax: [phone]   Genene Churn. [name], M.D.  [address] [facility]  [place]  [place] [address]  Fax: [phone]   Dr. Lovell Sheehan in [place], [place]

## 2011-04-13 NOTE — Op Note (Signed)
NAME:  Charles Faulkner, SINNING NO.:  [id number]   MEDICAL RECORD NO.:  [id number]          PATIENT TYPE:  INP   LOCATION:  IC04                          FACILITY:  [facility]   PHYSICIAN:  R. Roetta Sessions, M.D. DATE OF BIRTH:  30-Aug-1926   DATE OF PROCEDURE:  02/21/2005  DATE OF DISCHARGE:                                 OPERATIVE REPORT   PROCEDURE:  Esophagogastroduodenoscopy with enteroscopy.   INDICATION FOR PROCEDURE:  The patient is a 77 year old gentleman admitted  to the hospital with recurrent GI bleeding.  Prior EGD and colonoscopy last  year were negative.  Small bowel capsule study demonstrated the possibility  of two leiomyoma-appearing lesions in the small bowel.  EGD with enteroscopy  is now being done to further evaluate the etiology of this gentleman's  bleeding.  This approach has been discussed with the patient and the  patient's wife at length, the potential risks, benefits, and alternatives  have been reviewed, questions answered, he is agreeable.  Please see  documentation in the medical record.   PROCEDURE NOTE:  O2 saturation, blood pressure, pulse, and respiration were  monitored throughout the entirety of the procedure.   CONSCIOUS SEDATION:  Versed 3 mg IV, Demerol 50 mg IV in divided doses.   INSTRUMENT USED:  Olympus video colonoscope (pediatric colonoscope).   FINDINGS:  Examination of the esophagus revealed no mucosal abnormalities.  The EG junction was easily traversed.   Stomach:  The gastric cavity was empty and insufflated very well with air.  A thorough examination of the gastric mucosa, including retroflexed view of  the proximal stomach and esophagogastric junction, demonstrated no  abnormalities.  Pylorus patent and easily traversed.   Examination of the bulb, second and third portion revealed no abnormalities.  I was able to advance the colonoscope well into the jejunum.  Likewise, the  jejunal mucosa seen appeared entirely normal.   There was no blood in the  upper GI tract or small bowel.  From this level the scope was slowly  withdrawn and all previously-mentioned mucosal surfaces were again seen.  The patient tolerated the procedure well and was reacted in endoscopy.   IMPRESSION:  1.  Normal esophagus.  2.  Normal stomach.  3.  Normal D1 through D3.  4.  Proximal jejunum appeared normal.   RECOMMENDATIONS:  Proceed with a colonoscopy on February 22, 2005.      RMR/MEDQ  D:  02/21/2005  T:  02/21/2005  Job:  [id number]

## 2011-04-13 NOTE — Op Note (Signed)
NAME:  Charles Faulkner, Charles Faulkner NO.:  [id number]   MEDICAL RECORD NO.:  [id number]          PATIENT TYPE:  OIB   LOCATION:  6522                         FACILITY:  [facility]   PHYSICIAN:  Richard A. Alanda Amass, M.D.DATE OF BIRTH:  1926-04-03   DATE OF PROCEDURE:  04/09/2005  DATE OF DISCHARGE:                                 OPERATIVE REPORT   PROCEDURE:  Retrograde central aortic catheterization, aortic arch angiogram  shallow LAO projection, selective four vessel cerebral angiography by hand  injection with DSA imaging.   COMPLICATIONS:  None.   ANESTHESIA:  5 mg Valium p.o. premedication, 1% local Xylocaine, 2 mg Nubain  IV for sedation during procedure.   ESTIMATED BLOOD LOSS:  Approximately 10 mL.   Arterial pressures were monitored throughout the procedure and ranged 160 to  180 mmHg.  The patient remained in sinus rhythm. Preoperative creatinine  0.8, normal CBC and differential, LFTs.  LDL 99, cholesterol 153, HDL 33.  Normal coags.   BRIEF HISTORY:  This 77 year old married mildly obese white gentleman is a  long term patient of  Dr. Hazle Coca.  He has known coronary artery disease and  is status post CABG in 2002.  He has hyperlipidemia, hypertension, remote  tobacco abuse.  Catheterization October 2005 showed high grade stenosis of  the ostium of the SVG to the RCA which was stented with DES stent.  He had  mild to moderate aortic valvular stenosis and EF approximately 40%.  Duplex  ultrasound demonstrated progressive increase in velocities in the RICA to  367 systolic, 107 diastolic, compatible with high grade stenosis greater  than 70% to greater than 90%.  ICA/CCA ratio was 7.2.  There were moderately  increased velocities in the LICA of 231 systolic and 50 diastolic compatible  with at least moderate stenosis.  The patient is asymptomatic from a  neurologic standpoint and is stable from a cardiac standpoint on medical  therapy.   DESCRIPTION OF PROCEDURE:   The patient was brought to the sixth floor PV lab  in a post absorptive state after 5 mg Valium premedication.  The right groin  was prepped and draped in the usual manner.  1% Xylocaine was used for local  anesthesia.  The CRFA was entered with a single anterior puncture using an  18 thin wall needle.  A Wholey wire was used to traverse the iliac system  and a guide-wire exchange was used throughout the procedure.  A short 5  French Cordis side-arm sheath was inserted without difficulty.  The aortic  arch angiogram was done in the shallow LAO projection at 25 mL, 20 mL  per  second, with DSA.  The catheter was changed to a 5 [place] JB1 diagnostic  catheter.  Right carotid angiography was done in the PA bilateral oblique  lateral cervical projections and the PA and lateral intracranial  projections.  The catheter was then pulled back and advanced over the wire  to the right subclavian and subselective right vertebral and subclavian  injections were done in the cervical and intracranial PA and lateral  projections.  The catheter  was then pulled back.  Selective left internal  carotid angiography was not performed because of high grade ostial and  proximal stenosis and good visualization of the intracranial circulation on  right carotid injection.  The catheter was manipulated into the left  subclavian, left subclavian angiography and vertebral was done in the PA  cervical projection and the PA and lateral intracranial projection.  The  catheters were removed, side-arm sheath was flushed.  The patient tolerated  the procedure well.  He was taken to the holding area for sheath removal and  pressure hemostasis in stable condition.   Aortic arch angiography revealed a patent brachiocephalic right subclavian.  The RMA arose from the right subclavian and was normal and there was normal  origin of the right vertebral from the [facility].  The left common ostial and  proximal portion of the vessel had a  concentric 80-85% stenosis with mild  post stenotic dilatation.  The left external carotid had mild irregularities  segmentally proximally with good flow and normal proximal branches.  The  left internal carotid had segmental 60-70% segmental narrowing from its  origin for 1-2 cm in the proximal third.  There was good flow and no  ulceration seen.  The left subclavian had irregularities and minor 10%  narrowing in its proximal third.  The left vertebral arose normally without  ostial stenosis from the LACA which was tortuous but no significant  stenosis.  The aortic arch was a type 1-1.5 arch.  There was mild inferior  calcification seen.  The right internal carotid showed a normal cervical,  petrous, and cavernous settings.  The anterior cerebral filled well and  there was no cross filling to the left hemisphere except for a filling of  the left anterior cerebral through a patent anterior communicating branch.  Intracranial vessels extended normally to the outer cranium.  There was no  significant intracranial stenosis or aneurysm seen and there was normal  venous return.  The right internal carotid in its proximal portion had a  complex ulcerative plaque beginning at its origin extending approximately 1  to 1.5 cm.  It was hypodense and there appeared to be an approximately 80-  85% stenosis in the worse views.  The common carotid had no significant  stenosis.  The RICA beyond the stenosis appeared normal.  The right  vertebral had normal V1 extraosseous, foraminal and extraspinal segments.  The intracranial vessel appeared normal and there was normal basilar artery  opacification and bilateral posterior cerebral visualization.  The vessels  extended normally to the outer cranium and there was normal venous return.  The left subclavian injection revealed mild 10% narrowing in the mid portion with good residual lumen.  The left vertebral artery throughout the cervical  segments were widely  patent and appeared normal.  The intracranial-  intradural segment was normal forming the basilar artery and posterior  cerebral vessels were visualized on subselective left vertebral injection.  Lateral left vertebral angiography was not performed.   The patient has high grade LCCA ostial and proximal stenosis.  He has  borderline 16-10% smooth LICA stenosis.  The velocities in the LICA on  Duplex study may be falsely low due to the significant left common carotid  proximal stenosis.  There is an 80% complex ulcerative RICA proximal  stenosis which is hypodense as outlined above.  Intracranial circulation  appears normal.  The patient is a candidate for RCCA.  He is also a  potential candidate for medical therapy  and consideration for LCCA PPI with  stenting since this is a proximal ostial left common carotid stenosis.  Further recommendation pending further review and vascular surgical  consultation.   CATHETERIZATION DIAGNOSIS:  1. Asymptomatic bilateral carotid stenosis.      a. 80-85% ostial and proximal LCCA stenosis.      b. 78-46% smooth LICA stenosis.      c. 80% RICA complex ulcerative plaque hypodense stenosis with elevated         velocities.  2. Coronary artery disease with CABG in 2002.  3. Status post SVG to RCA ostial DES stenting October 2005.  4. Mild to moderate aortic valvular stenosis.  5. Hyperlipidemia.  6. Past history of GI bleeding, possible AVMs.  7. Systemic hypertension.        RAW/MEDQ  D:  04/09/2005  T:  04/09/2005  Job:  [id number]   cc:   Rudean Haskell, M.D.   Nanetta Batty, M.D.  Fax: [phone]

## 2011-04-13 NOTE — Op Note (Signed)
[place]. [facility]  Patient:    Charles Faulkner, Charles Faulkner                      MRN: [id number] Proc. Date: 03/14/01 Adm. Date:  [id number] Attending:  Charlett Lango CC:         [name], M.D.  Dr. Luciana Axe   Operative Report  PREOPERATIVE DIAGNOSIS:  Three-vessel coronary disease with class III angina.  POSTOPERATIVE DIAGNOSIS:  Three-vessel coronary disease with class III angina.  PROCEDURES:  Median sternotomy, extracorporeal circulation, coronary artery bypass grafting x 5 (left internal mammary artery to the left anterior descending coronary artery, saphenous vein graft to first diagonal, saphenous vein graft to obtuse marginal 1 and 2, saphenous vein graft to posterior descending).  SURGEON:  Salvatore Decent. Dorris Fetch, M.D.  ASSISTANT:  Dominica Severin, P.A.  ANESTHESIA:  General.  FINDINGS:  Posterior descending and diagonal 4, LAD, OM1 and 2 good-quality targets.  Saphenous vein to first diagonal adequate, good saphenous vein to OM and PDA.  Mammary 2 mm, excellent-quality graft.  Inferior and anterior scar. Left ventricular hypertrophy.  CLINICAL NOTE:  Charles Faulkner is a 77 year old gentleman with a history of peripheral vascular disease and hypertension.  He had presented with class III anginal symptoms.  He underwent cardiac catheterization by [name], M.D., which revealed significant three-vessel coronary disease.  The patient was referred for coronary artery bypass grafting.  The indications, risks, benefits, and alternatives were discussed in detail with the patient, and he understood and accepted the risks and agreed to proceed.  DESCRIPTION OF PROCEDURE:  Charles Faulkner was brought to the preoperative holding area on March 14, 2001.  Lines were placed to monitor arterial, central venous, and pulmonary arterial pressure.  EKG leads were placed for continuous telemetry.  The patient was taken to the operating room,  anesthetized, and intubated.  A Foley catheter was placed.  Intravenous antibiotics were administered.  The chest, abdomen, and legs were prepped and draped in the usual fashion.  A median sternotomy was performed, and the left internal mammary artery was harvested in the standard fashion.  Simultaneously an incision was made in the medial aspect of the left leg, and the greater saphenous vein was harvested. Beginning at the ankle and proceeding to the upper calf, higher in the upper calf the vein bifurcated.  Both branches were too small to use, and additional vein was harvested from the left thigh.  The patient was fully heparinized prior to dividing the distal end of the mammary artery.  There was an excellent flow through the cut end of the mammary.  The mammary artery was placed in a papaverine-soaked sponge and placed into the left pleural space.  The pericardium was opened, and the ascending aorta was inspected and palpated.  There was palpable atherosclerotic disease posteriorly in the proximal ascending aorta.  This did not interfere with the proposed placement of the cannula, the crossclamp, or the proximal anastomoses.  The aorta was cannulated via concentric 2-0 Ethibond nonpledgeted pursestring sutures.  A dual-stage venous cannula was placed via pursestring suture in the right atrial appendage.  Cardiopulmonary bypass was instituted, and the patient was cooled to 32 degrees Celsius.  The coronary arteries were inspected, and anastomotic sites were chosen.  The conduits were inspected and cut to length. A foam pad was placed in the pericardium to protect the left phrenic nerve.  A temperature probe was placed in the myocardial septum, and a  cardioplegia cannula was placed in the ascending aorta.  The aorta was crossclamped.  The left ventricle was emptied via the aortic root.  Then cardiac arrest then was achieved with a combination of cold antegrade blood cardioplegia and  topical ice saline.  Nine hundred cubic centimeters of cardioplegia was administered, and the myocardial septal temperature was 12 degrees Celsius.  The following distal anastomoses were performed:  First, an arteriotomy was made in a small posterolateral branch of the right coronary because the posterior descending was deeper than the AV groove fat. The vessels all connected via a patent distal right.  This posterolateral turned out to be too small to graft.  A vein patch was sewn to the arteriotomy, and then the vein patch itself was oversewn with a 7-0 Prolene suture.  The fat pad then was dissected out, and the posterior descending was identified.  An arteriotomy was performed on its proximal aspect, which was 1.5 mm in diameter.  The good-quality saphenous vein was anastomosed end-to-side to the posterior descending using a running 7-0 Prolene suture. At the completion of the anastomosis, there was excellent flow through the graft.  The posterior descending was a fair-quality target.  Next, a reversed saphenous vein graft was placed sequentially to the first and second obtuse marginal branches of the left circumflex coronary artery.  The first obtuse marginal was a 1.8 mm intramyocardial, good-quality vessel.  OM2 was a 1.5 mm good-quality vessel, although it was thin-walled.  The vein graft was of good quality.  Both anastomoses were performed with running 7-0 Prolene sutures.  A side-to-side anastomosis to OM1 was performed with the anatomy perpendicular to the arteriotomy.  The anastomosis to OM2 was end-to-side. There was excellent flow through the graft.  Cardioplegia was administered, and there was good hemostasis at both anastomoses.  Next, the only remaining segment of vein suitable for grafting to use on the first diagonal was a very small portion of the saphenous vein.  The remainder of the vein was unusable.  This vein was of small caliber and fair quality. It was  anastomosed end-to-side to the first diagonal, which was a 1 mm  fair-quality target, using a running 7-0 Prolene suture.  At the completion of the anastomosis, there was adequate flow through the graft.  Cardioplegia was administered, and there was good hemostasis.  Next, the left internal mammary artery was brought through a window in the pericardium anterior to the left phrenic nerve.  The distal end of the mammary was spatulated.  It was a 2.5 mm, good-quality conduit.  The LAD was a 1.5 mm good-quality target.  The anastomosis was performed end-to-side with a running 8-0 Prolene suture.  At the completion of the mammary to LAD anastomosis, the bulldog clamp was briefly removed to inspect for hemostasis and then was replaced.  Immediate and rapid septal rewarming was noted.  Additional cardioplegia was administered down the vein grafts as well as the aortic root. The mammary pedicle was tacked to the epicardial surface of the heart with 6-0 Prolene sutures.  The vein grafts were cut to length.  The proximal anastomoses of the OM sequential graft and the posterior descending vein graft were performed to 4.4 mm punch aortotomies while under crossclamp.  Both of these were performed with running 6-0 Prolene sutures.  While still under crossclamp, a venotomy was made in the obtuse marginal vein graft, and the proximal anastomosis of the diagonal vein graft was placed on this venotomy and anastomosed with a  running 7-0 Prolene suture.  The patient then was placed in Trendelenburg position.  The bulldog clamp was removed from the mammary artery.  Immediate and rapid septal rewarming was once again noted. Backbleeding was allowed to occur and to fill the aortic root to de-air the root before removing the aortic crossclamp.  The total crossclamp time was 102 minutes.  Lidocaine was administered.  All proximal and distal anastomoses were inspected for hemostasis.  There was bleeding from the  fat pad around the dissection of the distal right coronary and posterior descending, which was controlled with a 6-0 Prolene suture.  Epicardial pacing wires were placed on the right ventricle and right atrium.  Rewarming had been begun during the mammary to LAD anastomosis and when the core temperature reached 37 degrees Celsius, the patient was weaned from cardiopulmonary bypass without difficulty.  The total bypass time was 169 minutes.  Initial cardiac index was greater than 2 L/min. per sq m.  The patient was on no inotropic support at the time of separation from bypass.  He was briefly vasodilated with a low systemic resistance and did require Neo-Synephrine and eventually dopamine drips to support his blood pressure, although his cardiac function remained excellent throughout.  A test dose of protamine was administered and was well-tolerated.  The atrial and aortic cannulae were removed.  The remainder of the protamine was administered without incident.  The chest was irrigated with 1 L of warm normal saline containing 1 g of vancomycin.  Hemostasis was achieved.  A left pleural and two mediastinal chest tubes were placed through separate subcostal incisions and secured with #1 silk sutures.  The pericardium was reapproximated with interrupted 3-0 silk sutures and came together easily without any tension on the underlying grafts.  The sternum wound was closed with heavy-gauge interrupted stainless steel wires.  The pectoralis fascia was closed with a running #1 Vicryl suture.  Subcutaneous tissue and skin were closed in standard fashion with subcuticular sutures.  All sponge, needle, and instrument counts were correct at the end of the procedure.  The patient was taken from the operating room to the surgical intensive care unit intubated, in stable condition. DD:  03/14/01 TD:  03/17/01 Job: [id number]

## 2011-04-14 ENCOUNTER — Inpatient Hospital Stay (HOSPITAL_COMMUNITY): Payer: Medicare Other

## 2011-04-14 LAB — GLUCOSE, CAPILLARY: Glucose-Capillary: 152 mg/dL — ABNORMAL HIGH (ref 70–99)

## 2011-04-14 LAB — CBC
HCT: 36.4 % — ABNORMAL LOW (ref 39.0–52.0)
Hemoglobin: 11.6 g/dL — ABNORMAL LOW (ref 13.0–17.0)
MCH: 29.2 pg (ref 26.0–34.0)
MCV: 91.7 fL (ref 78.0–100.0)
RBC: 3.97 MIL/uL — ABNORMAL LOW (ref 4.22–5.81)

## 2011-04-14 LAB — BASIC METABOLIC PANEL
BUN: 12 mg/dL (ref 6–23)
CO2: 27 mEq/L (ref 19–32)
Chloride: 109 mEq/L (ref 96–112)
Creatinine, Ser: <0.47 mg/dL (ref 0.4–1.5)
Glucose, Bld: 131 mg/dL — ABNORMAL HIGH (ref 70–99)

## 2011-04-15 LAB — GLUCOSE, CAPILLARY
Glucose-Capillary: 115 mg/dL — ABNORMAL HIGH (ref 70–99)
Glucose-Capillary: 165 mg/dL — ABNORMAL HIGH (ref 70–99)
Glucose-Capillary: 112 mg/dL — ABNORMAL HIGH (ref 70–99)

## 2011-04-16 LAB — COMPREHENSIVE METABOLIC PANEL
ALT: 11 U/L (ref 0–53)
AST: 16 U/L (ref 0–37)
Albumin: 1.6 g/dL — ABNORMAL LOW (ref 3.5–5.2)
CO2: 27 mEq/L (ref 19–32)
Calcium: 8.6 mg/dL (ref 8.4–10.5)
Chloride: 107 mEq/L (ref 96–112)
Sodium: 138 mEq/L (ref 135–145)
Total Bilirubin: 0.2 mg/dL — ABNORMAL LOW (ref 0.3–1.2)

## 2011-04-16 LAB — DIFFERENTIAL
Basophils Absolute: 0 10*3/uL (ref 0.0–0.1)
Basophils Relative: 0 % (ref 0–1)
Neutro Abs: 6.7 10*3/uL (ref 1.7–7.7)
Neutrophils Relative %: 76 % (ref 43–77)

## 2011-04-16 LAB — GLUCOSE, CAPILLARY
Glucose-Capillary: 129 mg/dL — ABNORMAL HIGH (ref 70–99)
Glucose-Capillary: 126 mg/dL — ABNORMAL HIGH (ref 70–99)
Glucose-Capillary: 131 mg/dL — ABNORMAL HIGH (ref 70–99)
Glucose-Capillary: 120 mg/dL — ABNORMAL HIGH (ref 70–99)

## 2011-04-16 LAB — TRIGLYCERIDES: Triglycerides: 63 mg/dL (ref <150)

## 2011-04-16 LAB — CBC
Hemoglobin: 11.3 g/dL — ABNORMAL LOW (ref 13.0–17.0)
Platelets: 222 10*3/uL (ref 150–400)
RBC: 3.77 MIL/uL — ABNORMAL LOW (ref 4.22–5.81)

## 2011-04-24 NOTE — H&P (Signed)
NAME:  Charles Faulkner, Charles Faulkner NO.:  [id number]  MEDICAL RECORD NO.:  [id number]           PATIENT TYPE:  E  LOCATION:  [facility]                         FACILITY:  [facility]  PHYSICIAN:  Sharlet Salina T. Jeziah Kretschmer, M.D.DATE OF BIRTH:  Mar 25, 1926  DATE OF ADMISSION:  04/10/2011 DATE OF DISCHARGE:                             HISTORY & PHYSICAL   PRIMARY CARE PHYSICIAN:  Madelin Rear. Sherwood Gambler, MD  CARDIOLOGIST:  Nanetta Batty, MD  CHIEF COMPLAINT:  Nausea, vomiting x3 days, rule out small bowel obstruction versus ileus.  BRIEF HISTORY:  Patient is an 77 year old white male, who presented with a cecal mass in [place] in April.  He was admitted on March 16, 2011, and transferred to [facility] with question of perforation.  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  He underwent laparotomy, right hemicolectomy, small-bowel resection, intraoperative liver ultrasound on March 21, 2011 with Dr. Donell Beers where he was found to have a T4 N2 M1 invasive adenocarcinoma and was also metatatis on peritoneal biopsy.  His postoperative course was slow complicated by deconditioning.  He has problems with ambulation.  He also had a postoperative wound infection.  He qualified for skilled nursing care, but the family preferred to take him home.  The patient has apparently been doing fairly well at home up until Saturday when he and his wife both went to [employer] and had a lunch there.  Since then, they both had problems with nausea.  The patient reports he has vomited every time he ate since early Sunday morning.  His last bowel movement was approximately 3 days ago.  He has had no trouble before of his bowel movements, but they were every 2-3 days.  His wife reports she has also been sick with nausea, has only been able to keep down a coke since they had lunch at [employer] last Saturday.  She has not had any  vomiting, diarrhea, but they both have been sick and he seems to be worse than she is.  He was seen in the ER at [facility].  Workup there included a BMP, which shows a sodium of 142, potassium of 3.4, chloride of 101, CO2 of 34, BUN of 22, creatinine of 0.85 and a glucose of 198.  Three-way abdomen shows emphysema and atelectasis, dilated loops with diffuse small air fluid levels, questioned ileus versus obstruction.  He was transferred to [facility] for evaluation by our service and further treatment.  PAST MEDICAL HISTORY: 1. Hospitalized March 16, 2011 through Apr 02, 2011 with right     hemicolectomy, small-bowel resection, intraoperative liver     ultrasound.   2.  History of coronary artery disease with MI and     CABG in 2002, PTCA and Cypher stent to the RCA, October 2006.     History of atrial fibrillation. 2. Peripheral vascular occlusive disease with right carotid     endarterectomy. 3. Dyslipidemia. 4. Chronic anemia. 5. Deconditioning, postoperative confusion. 6. History of GI bleed.  PAST SURGICAL HISTORY: 1. CABG, April 2002. 2. Cypher stent, October 2006.  3. Right hemicolectomy small-bowel resection, March 21, 2011.  FAMILY HISTORY:  Noncontributory.  SOCIAL HISTORY:  No alcohol, tobacco, or drugs.  He is living at home. His wife is taking care of him with home health.  REVIEW OF SYSTEMS:  FEVER:  None.  PULMONARY:  No shortness of breath. No coughing, wheezing, recent URI.  CV:  He reports being dizzy when standing.  CARDIAC:  No pain or palpitations.  Weight has not been measured since he was discharged home Apr 02, 2011.  His wife notes his blood pressure has been good when taken by the home health nurse.  GI: Positive for nausea and vomiting.  No diarrhea.  No constipation.  No blood.  GU:  He is voiding normally.  SKIN:  He has a wound that is opened and he has an area, she said it was on his spine, but is really on his buttocks, which is nonhealing.  He  uses a walker to go to the bathroom the day and before Saturday he has been pretty sedentary since he started getting sick Sunday morning.  The remainder of the review of systems was noncontributory.  MEDICATIONS:  This is from the M-Stat from [facility]. 1. Percocet 5/325 p.r.n. 2. Colace one daily. 3. Ferrous sulfate 325 mg t.i.d. 4. Hydrochlorothiazide 12.5 one daily. 5. Lipitor 40 mg daily. 6. Multivitamin one daily. 7. Niaspan 1500 mg at bedtime. 8. Plavix 75 mg daily. 9. Prevacid 15 mg daily. 10.Tenormin 50 mg once daily. 11.Zetia 10 mg once daily.  ALLERGIES:  LOPRESSOR, IT CAUSES HIVES ALL OVER.  PHYSICAL EXAMINATION:  GENERAL:  This is a chronically ill white male, in no acute distress. VITAL SIGNS:  Temperature is 97.8, heart rate was 109, blood pressure 121/68, respiratory rate is 16, sats are 96% on room air.  He is alert, oriented, and cooperative.  Hearing is decreased bilaterally.  Vision is intact. HEENT:  He has some swelling in the right lower lid, which has been present for 6 months per patient.  Nose, throat, and mouth are normal. NECK:  Trachea is in the midline.  There is a right carotid endarterectomy scar.  No JVD or thyromegaly. CHEST:  Clear to auscultation, no rales, ronchi, or wheezing. CARDIAC:  No murmur or rub was heard, normal S1S2 EXTREMITIES:  Pulses, upper extremities has good pulses.  Lower extremities, both lower extremities distal pulses are diminished.  I could not feel a right femoral, but there is a bruit on the right.  Left femoral was normal. ABDOMEN:  Soft, nontender, occasional bowel sounds.  Not distended.  He has an open incision, which is packed.  He has an ABD over that and the ABD was changed this morning before he went to [facility], it is completely soaked through with clear serous drainage.  The opened areas are clean, but there is some fibrous material at the base especially in the lower portion. GU:  Normal  male. LYMPHADENOPATHY:  None palpated, cervical, axillary, femoral. MUSCULOSKELETAL:  No changes. SKIN:  He has an open abdominal wound as described above.  He has early decubitus on his buttocks at least stage I with discoloration of the skin right over the coccyx.  He also has couple areas where he is losing skin adjacent to this. NEUROLOGIC:  Cranial nerves are grossly intact.  His hearing is diminished. PSYCH:  Alert, oriented, and cooperative.  He defers to his wife whenever possible.  IMPRESSION: 1. Nausea and vomiting, questionable gastroenteritis versus ileus  with     few air-fluid levels on flat plate. 2. Right hemicolectomy and small-bowel resection March 21, 2011, Dr.     Donell Beers with T4 N2 M1 adenocarcinoma with peritoneal mets. 3. Coronary artery disease status post myocardial infarction and CABG     in 2002, PTCA in 2006, last EF was 40-50%. 4. History of atrial fibrillation. 5. History of congestive heart failure. 6. Peripheral vascular occlusive disease with a history a right CE. 7. Dyslipidemia. 8. Anemia by history. 9. Stage I decubitus 10.History of confusion, deconditioning.  PLAN:  I am going to admit.  I am going to place him on IV fluids.  We will place an NG if he has any further nausea and vomiting.  We would check his labs, watch his and abdomen for further signs of ileus.  We will have the wound care nurse see him and start treating him for his early decubitus with further workup and treatment as indicated.     Eber Hong, P.A.   ______________________________ Lorne Skeens. Shelsy Seng, M.D.    WDJ/MEDQ  D:  04/10/2011  T:  04/10/2011  Job:  [id number]  cc:   Madelin Rear. Sherwood Gambler, MD Nanetta Batty, M.D.  Electronically Signed by Sherrie George P.A. on 04/15/2011 08:53:46 PM Electronically Signed by Glenna Fellows M.D. on 04/24/2011 10:10:04 AM

## 2011-05-11 ENCOUNTER — Ambulatory Visit: Payer: PRIVATE HEALTH INSURANCE | Admitting: Internal Medicine

## 2011-05-11 NOTE — Discharge Summary (Signed)
NAME:  Charles Faulkner, Charles Faulkner NO.:  [id number]  MEDICAL RECORD NO.:  [id number]           PATIENT TYPE:  I  LOCATION:  5527                         FACILITY:  [facility]  PHYSICIAN:  Gabrielle Dare. Janee Morn, M.D.DATE OF BIRTH:  [birth year]  DATE OF ADMISSION:  04/10/2011 DATE OF DISCHARGE:  04/18/2011                              DISCHARGE SUMMARY   PRIMARY CARE PHYSICIAN:  Madelin Rear. Sherwood Gambler, MD  CARDIOLOGIST:  Nanetta Batty, MD, [place] office.  PRIMARY SURGEON:  Almond Lint, MD  ADMISSION DIAGNOSES: 1. Nausea and questionable gastroenteritis versus ileus versus partial     small bowel obstruction. 2. Status post right hemicolectomy and small bowel resections March 21, 2011, Dr. Donell Beers with T4, N2, M1 adenocarcinoma with peritoneal     metastases. 3. Coronary artery disease, history of myocardial infarction, and     coronary artery bypass graft in 2002.  PTCA in 2006.  Last     documented EF available 40-50%. 4. History of chronic atrial fibrillation and aortic stenosis. 5. History of congestive heart failure. 6. Peripheral vascular occlusive disease with right carotid     endarterectomy. 7. Stage I decubitus. 8. Dyslipidemia. 9. History of anemia. 10.Postoperative confusion, deconditioning, and malnutrition.  DISCHARGE DIAGNOSES: 1. Small bowel obstruction status post right hemicolectomy with small     bowel resection March 21, 2011, Dr. Donell Beers T4, N2, M1     adenocarcinoma with peritoneal metastases. 2. Coronary artery disease status post coronary artery bypass graft in     2002, status post PTCA in 2006, last EF 40-50%. 3. Chronic atrial fibrillation, history of aortic stenosis, history of     congestive heart failure. 4. Peripheral vascular occlusive disease status post right carotid     endarterectomy. 5. Stage I decubitus. 6. History of postoperative confusion, deconditioning, and     malnutrition. 7. History anemia. 8.  Dyslipidemia.  PROCEDURES: 1. CT scan of Apr 11, 2011, showing small bowel obstruction with     massive gastric distention, transition point was in the right lower     abdomen adjacent to the cecal anastomosis line, second anastomosis     in the deep pelvis without adverse features. 2. Small pleural effusion and presacral inflammatory changes. 3. PICC line and TNA therapy.  BRIEF HISTORY:  The patient is an 77 year old gentleman who as noted above underwent a right hemicolectomy and small bowel resection on March 21, 2011, by Dr. Donell Beers for adenocarcinoma.  He is also going to have peritoneal metastasis on biopsy.  He had a difficult and slow postoperative course secondary to his confusion and deconditioning.  His wife insisted on taking him home.  He has actually done well since discharge up until weekend prior to admission.  At that time, he and his wife both ate at the [employer] in [place].  They both became nauseated and had trouble with keeping anything down.  His wife was able to drink a coke, but he has had recurrent nausea and vomiting, and has not eaten for the last 3 days.  He was seen at [facility] where the only labs at  the time I saw him available was a BMP, which showed his potassium was little low otherwise fairly normal.  On evaluation here, the patient was seen and was actually dehydrated.  He was mentating very slowly.  Frequently, referred to his wife.  Films obtained showed some emphysema and dilated loops with diffuse small air- fluid levels question of ileus versus obstruction.  On physical exam, his abdomen did not appear as bad as his x-ray suggested, but he was admitted, placed on hydration with further workup and evaluation as needed.  PAST MEDICAL HISTORY:  As above.  MEDICATIONS ON ADMISSION: 1. Percocet 5/325 p.r.n. 2. Colace 1 daily. 3. Ferrous sulfate 325 mg t.i.d. 4. Hydrochlorothiazide 12.5 mg daily. 5. Lipitor 40 mg  daily. 6. Multivitamin 1 daily. 7. Niaspan 1500 mg at bedtime. 8. Plavix 75 mg daily. 9. Prevacid 15 mg daily. 10.Tenormin 50 mg daily. 11.Zetia 1 daily.  ALLERGIES:  LOPRESSOR causes hives.  For further history and physical, please see the dictated note.  HOSPITAL COURSE:  The patient was admitted.  He was seen and evaluated by Dr. Johna Sheriff who agreed he should be admitted for hydration.  A CT scan was ordered, but was not available till late around midnight that night.  Labs showed a white count of 20,800, hemoglobin 12.8, hematocrit 37.8.  Repeat labs here showed a sodium of 143, potassium of 3.9, chloride of 103, CO2 of 34, BUN 23, creatinine 0.65, and glucose of 162. BNP was requested.  Pro-BNPs are now available and this was 4039.  He was seen in consultation by [facility] and Vascular because of his elevated BNP.  Clinically, he was not in failure, but with his history we went forward with consult anyway.  I saw the patient and agreed there was no signs of heart failures, heart rate was controlled with Tenormin.  He was maintained on IV Lopressor and digoxin was added. He had a rather slow course over the next week.  TNA was started on May16, 2012.  As he showed improvement, his diet was finally advanced to clear liquids.  It was Dr. Jamse Mead opinion that he probably had a high-grade partial small bowel obstruction.  He started passing gas, but could never really have a good bowel movement.  He was ultimately given a Dulcolax suppository, which was a little help from the nurse, produced a very large bowel movement.  His diet was advanced.  His TNA was weaned down and by Apr 18, 2011, he was ready for discharges from the wife's standpoint.  He did not have another bowel movement over the last 48 hours.  We repeated the Dulcolax suppository, and he again had good results and at the wife's request, we also will plan to discharge him home on Apr 18, 2011.  PT and OT has  worked with the patient throughout his hospital course, and he is ambulating actually quite well now.  His mentation has completely gone back to baseline.  He is alert, oriented, cooperative, talkative, and is doing quite well.  He has been switched over to p.o. medicines.  We made a few changes in addition to the digoxin.  We have changed his Tenormin from 50 mg daily to 25 mg b.i.d. with whole parameters for heart rate less than 65 or a systolic blood pressure less than 100.  I have given him Dulcolax suppositories with instructions to use every 2-3 days if he does not have a bowel movement, if the problems persist to contact Dr. Sharyon Medicus  primary care.  Digoxin 0.125 mg daily.  He is to have this refilled at Dr. Hazle Coca office. Ensure 237 mL cans.  I have instructed his wife to give him 2 or 3 of these a day if he can tolerate it.  He will continue miconazole power to the appropriate places on his buttocks for his skin changes.  We are starting him on MiraLax 17 g p.o. daily.  He will continue hydrochlorothiazide 12.5 mg daily, Lipitor 40 mg daily, multivitamin with iron 1 daily.  I have instructed them for the next bottle of vitamins just get a Women's multivitamin with iron.  We will continue his Plavix 75 mg daily, Niaspan 1500 mg daily, Prevacid 15 mg daily.  We will have them discontinue the Tenormin 50 mg daily until we assure his blood pressure and heart rate will tolerate it.  Discharge to home.  We will reactivate his home health.  I am going ask them to continue the dressing changes to his decubitus.  We are also going to have him take his blood pressure twice a day and again hold for systolic blood pressure less than 100 or heart rate less than 65.  We will continue his wet-to-dry dressing changes to his abdomen.  There a gently cleaned base of both open areas with a 4 x 4 and then pack wet-to-dry b.i.d. p.r.n.. If he has increased serous drainage, they should contact Dr.  Arita Miss office to see her sooner.  DISCHARGE ACTIVITY:  Light to moderate.  The patient is ambulating with physical therapy and is doing quite well.  Follow up with Dr. Sherwood Gambler in 1- 2 weeks.  Follow up with Dr. [name] in 1-2 weeks both in [place].  CONDITION ON DISCHARGE:  Improved.     Eber Hong, P.A.   ______________________________ Gabrielle Dare. Janee Morn, M.D.    WDJ/MEDQ  D:  04/18/2011  T:  04/19/2011  Job:  [id number]  cc:   Nanetta Batty, M.D. Madelin Rear. Sherwood Gambler, MD  Electronically Signed by Sherrie George P.A. on 05/02/2011 03:17:44 PM Electronically Signed by Violeta Gelinas M.D. on 05/11/2011 06:31:42 PM

## 2011-05-29 ENCOUNTER — Other Ambulatory Visit: Payer: Self-pay | Admitting: Oncology

## 2011-05-29 ENCOUNTER — Encounter (HOSPITAL_BASED_OUTPATIENT_CLINIC_OR_DEPARTMENT_OTHER): Payer: Medicare Other | Admitting: Oncology

## 2011-05-29 DIAGNOSIS — D509 Iron deficiency anemia, unspecified: Secondary | ICD-10-CM

## 2011-05-29 DIAGNOSIS — I080 Rheumatic disorders of both mitral and aortic valves: Secondary | ICD-10-CM

## 2011-05-29 DIAGNOSIS — C180 Malignant neoplasm of cecum: Secondary | ICD-10-CM

## 2011-05-29 DIAGNOSIS — I2510 Atherosclerotic heart disease of native coronary artery without angina pectoris: Secondary | ICD-10-CM

## 2011-05-29 LAB — CBC WITH DIFFERENTIAL/PLATELET
BASO%: 0.3 % (ref 0.0–2.0)
EOS%: 2.2 % (ref 0.0–7.0)
HCT: 36.2 % — ABNORMAL LOW (ref 38.4–49.9)
LYMPH%: 27.2 % (ref 14.0–49.0)
MCH: 31 pg (ref 27.2–33.4)
MCHC: 33 g/dL (ref 32.0–36.0)
MONO#: 0.9 10*3/uL (ref 0.1–0.9)
MONO%: 12 % (ref 0.0–14.0)
NEUT%: 58.3 % (ref 39.0–75.0)
Platelets: 226 10*3/uL (ref 140–400)
RBC: 3.84 10*6/uL — ABNORMAL LOW (ref 4.20–5.82)
WBC: 7.4 10*3/uL (ref 4.0–10.3)

## 2011-05-29 LAB — COMPREHENSIVE METABOLIC PANEL
ALT: 22 U/L (ref 0–53)
AST: 28 U/L (ref 0–37)
Alkaline Phosphatase: 117 U/L (ref 39–117)
CO2: 29 mEq/L (ref 19–32)
Creatinine, Ser: 0.63 mg/dL (ref 0.50–1.35)
Sodium: 142 mEq/L (ref 135–145)
Total Bilirubin: 0.4 mg/dL (ref 0.3–1.2)
Total Protein: 6.5 g/dL (ref 6.0–8.3)

## 2011-05-31 ENCOUNTER — Telehealth (INDEPENDENT_AMBULATORY_CARE_PROVIDER_SITE_OTHER): Payer: Self-pay | Admitting: General Surgery

## 2011-05-31 NOTE — Telephone Encounter (Signed)
[facility] nurse wanted to continue wound care for pt-still has open wound.Charles Faulkner

## 2011-06-07 ENCOUNTER — Ambulatory Visit (INDEPENDENT_AMBULATORY_CARE_PROVIDER_SITE_OTHER): Payer: Medicare Other | Admitting: General Surgery

## 2011-06-07 ENCOUNTER — Encounter (INDEPENDENT_AMBULATORY_CARE_PROVIDER_SITE_OTHER): Payer: Self-pay | Admitting: General Surgery

## 2011-06-07 VITALS — Temp 97.6°F

## 2011-06-07 DIAGNOSIS — C182 Malignant neoplasm of ascending colon: Secondary | ICD-10-CM | POA: Insufficient documentation

## 2011-06-07 NOTE — Progress Notes (Signed)
Subjective:     Patient ID: Charles Faulkner, male   DOB: Nov 06, 1926, 77 y.o.   MRN: [id number]    Temp(Src) 97.6 F (36.4 C) (Temporal)    HPI The patient is doing well status post right hemicolectomy in April. He had had a very large tumor and did have a wound infection. His wife has been changing his dressing every day and doing a great job. His energy level is continuing to improve. He has started on chemotherapy. He had a very small decubitus wound over his sacrum which is now healed as well. Review of Systems Otherwise negative    Objective:   Physical Exam Alert and oriented x3 in no acute distress. Antalgic gait. Abdomen soft nontender nondistended wound is continuing to contract. There are some exposed suture knot and sutures are clipped. Decubitus ulcer is better.    Assessment:    Colon cancer: Getting chemotherapy Wound infection: Continue current wet to dry qday to abdomen    Plan:     Follow up with me in  4-6 weeks to assess wound

## 2011-06-22 ENCOUNTER — Other Ambulatory Visit: Payer: Self-pay | Admitting: Oncology

## 2011-06-22 ENCOUNTER — Encounter (HOSPITAL_BASED_OUTPATIENT_CLINIC_OR_DEPARTMENT_OTHER): Payer: Medicare Other | Admitting: Oncology

## 2011-06-22 DIAGNOSIS — I2510 Atherosclerotic heart disease of native coronary artery without angina pectoris: Secondary | ICD-10-CM

## 2011-06-22 DIAGNOSIS — C180 Malignant neoplasm of cecum: Secondary | ICD-10-CM

## 2011-06-22 DIAGNOSIS — D509 Iron deficiency anemia, unspecified: Secondary | ICD-10-CM

## 2011-06-22 DIAGNOSIS — I080 Rheumatic disorders of both mitral and aortic valves: Secondary | ICD-10-CM

## 2011-06-22 LAB — CBC WITH DIFFERENTIAL/PLATELET
Basophils Absolute: 0 10*3/uL (ref 0.0–0.1)
EOS%: 2.2 % (ref 0.0–7.0)
Eosinophils Absolute: 0.2 10*3/uL (ref 0.0–0.5)
HCT: 34.1 % — ABNORMAL LOW (ref 38.4–49.9)
HGB: 11.4 g/dL — ABNORMAL LOW (ref 13.0–17.1)
MCH: 31.4 pg (ref 27.2–33.4)
MCV: 94.4 fL (ref 79.3–98.0)
MONO%: 8.2 % (ref 0.0–14.0)
NEUT#: 5.7 10*3/uL (ref 1.5–6.5)
NEUT%: 69.2 % (ref 39.0–75.0)
Platelets: 200 10*3/uL (ref 140–400)
RDW: 16.1 % — ABNORMAL HIGH (ref 11.0–14.6)

## 2011-06-22 LAB — COMPREHENSIVE METABOLIC PANEL
AST: 23 U/L (ref 0–37)
Albumin: 3.4 g/dL — ABNORMAL LOW (ref 3.5–5.2)
Alkaline Phosphatase: 99 U/L (ref 39–117)
BUN: 11 mg/dL (ref 6–23)
Calcium: 9.7 mg/dL (ref 8.4–10.5)
Chloride: 111 mEq/L (ref 96–112)
Creatinine, Ser: 0.73 mg/dL (ref 0.50–1.35)
Glucose, Bld: 197 mg/dL — ABNORMAL HIGH (ref 70–99)
Potassium: 3.8 mEq/L (ref 3.5–5.3)

## 2011-07-02 ENCOUNTER — Ambulatory Visit (INDEPENDENT_AMBULATORY_CARE_PROVIDER_SITE_OTHER): Payer: Medicare Other | Admitting: General Surgery

## 2011-07-02 DIAGNOSIS — C182 Malignant neoplasm of ascending colon: Secondary | ICD-10-CM

## 2011-07-02 NOTE — Progress Notes (Signed)
GRADEN HOSHINO is a 77 y.o. male.    Chief Complaint  Patient presents with  . Wound Check    Recheck incisio, [facility] Nurse states scar is opening up.    HPI Wound Check   Doing well.  Home health RN concerned about wound.  He is eating well, tolerating chemo well, and gaining weight.  He denies fever/chills.    Past Medical History  Diagnosis Date  . CAD (coronary artery disease)   . PAD (peripheral artery disease)   . Hyperlipemia   . Aortic stenosis, moderate   . A-fib     not candidate for coumadin  . AAA (abdominal aortic aneurysm)     followed by Dr. [name]  . IDA (iron deficiency anemia)     gi bleed on ASA/Plavix    Past Surgical History  Procedure Date  . Sb capsule endoscopy 11/2004    ?nonbleeding leiomyomas in distal SB  . Esophagogastroduodenoscopy 01/2005    with enteroscopy, normal through proximal jejunum  . Colonoscopy 01/2005    with terminal ileoscopy, normal exam except internal hemorrhoids  . Skin cancer removal   . Coronary artery bypass graft 2002  . Carotid endarterectomy 2006    right  . Rca otium stent 2005    Family History  Problem Relation Age of Onset  . Colon cancer Neg Hx   . Liver disease Neg Hx   . Inflammatory bowel disease Neg Hx   . Heart disease Mother   . Heart disease Father   . Cancer Sister     colon    Social History History  Substance Use Topics  . Smoking status: Former Games developer  . Smokeless tobacco: Never Used   Comment: quit remotely  . Alcohol Use: No    Allergies  Allergen Reactions  . Metoprolol Tartrate     REACTION: hives    Current Outpatient Prescriptions  Medication Sig Dispense Refill  . bisacodyl (FLEET) 10 MG/30ML ENEM Place 10 mg rectally once.        . clopidogrel (PLAVIX) 75 MG tablet Take 75 mg by mouth daily.        Marland Kitchen docusate sodium (COLACE) 100 MG capsule Take 100 mg by mouth daily.        . ferrous sulfate 325 (65 FE) MG tablet Take 325 mg by mouth daily with breakfast.        .  hydrochlorothiazide 25 MG tablet Take 0.5 tablets by mouth daily.      . lansoprazole (PREVACID) 15 MG capsule Take 15 mg by mouth daily.        Marland Kitchen LIPITOR 40 MG tablet Take 1 tablet by mouth daily.      . Multiple Vitamins-Minerals ([employer] SPECTRAVITE SENIOR PO) Take 1 tablet by mouth daily.        . niacin (NIASPAN) 500 MG CR tablet Take 500 mg by mouth at bedtime.        Marland Kitchen oxyCODONE-acetaminophen (PERCOCET) 5-325 MG per tablet Take 1 tablet by mouth every 4 (four) hours as needed.        . TENORMIN 50 MG tablet Take 1 tablet by mouth daily.      Marland Kitchen ZETIA 10 MG tablet Take 1 tablet by mouth daily.        Review of Systems Review of Systems  All other systems reviewed and are negative.    Physical Exam Physical Exam  Constitutional: He is oriented to person, place, and time. He appears well-developed and well-nourished.  HENT:  Head: Normocephalic and atraumatic.  Eyes: Conjunctivae are normal. Pupils are equal, round, and reactive to light.  Cardiovascular: Normal rate, regular rhythm and normal heart sounds.   Respiratory: Effort normal. He exhibits no tenderness.  GI: Soft. Bowel sounds are normal. He exhibits no distension. There is no tenderness. A hernia (ventral hernia at open incision site) is present.         Small incisional hernia  Neurological: He is alert and oriented to person, place, and time. Coordination normal.  Skin: Skin is warm and dry. No rash noted. No erythema. No pallor.  Psychiatric: He has a normal mood and affect. His behavior is normal. Judgment and thought content normal.     There were no vitals taken for this visit.  Assessment/Plan  Stage IV cancer of cecum T4b N2b M1, peritoneal mets Continue dressing changes Abdominal binder Follow up in 2-3 weeks.       Sadie Hazelett 07/02/2011, 9:34 AM

## 2011-07-02 NOTE — Assessment & Plan Note (Signed)
Continue dressing changes Abdominal binder Follow up in 2-3 weeks.

## 2011-07-12 ENCOUNTER — Telehealth (INDEPENDENT_AMBULATORY_CARE_PROVIDER_SITE_OTHER): Payer: Self-pay

## 2011-07-12 NOTE — Telephone Encounter (Signed)
The nurse from [facility]  Called to report that the pt had an incident of severe SOB yesterday.  His wife called EMS who arrived and stabilized the pt.  He was not transported to the ED.  I told the nurse that I would make Dr. Donell Beers aware, and advised she call either his PCP or Cardiologist.  She agreed.

## 2011-07-13 ENCOUNTER — Other Ambulatory Visit: Payer: Self-pay | Admitting: Oncology

## 2011-07-13 ENCOUNTER — Encounter (HOSPITAL_BASED_OUTPATIENT_CLINIC_OR_DEPARTMENT_OTHER): Payer: Medicare Other | Admitting: Oncology

## 2011-07-13 DIAGNOSIS — I2510 Atherosclerotic heart disease of native coronary artery without angina pectoris: Secondary | ICD-10-CM

## 2011-07-13 DIAGNOSIS — I080 Rheumatic disorders of both mitral and aortic valves: Secondary | ICD-10-CM

## 2011-07-13 DIAGNOSIS — C180 Malignant neoplasm of cecum: Secondary | ICD-10-CM

## 2011-07-13 DIAGNOSIS — D509 Iron deficiency anemia, unspecified: Secondary | ICD-10-CM

## 2011-07-13 LAB — CBC WITH DIFFERENTIAL/PLATELET
Basophils Absolute: 0 10*3/uL (ref 0.0–0.1)
Eosinophils Absolute: 0.2 10*3/uL (ref 0.0–0.5)
HCT: 34.9 % — ABNORMAL LOW (ref 38.4–49.9)
HGB: 11.8 g/dL — ABNORMAL LOW (ref 13.0–17.1)
LYMPH%: 20.2 % (ref 14.0–49.0)
MCHC: 33.8 g/dL (ref 32.0–36.0)
MONO#: 0.9 10*3/uL (ref 0.1–0.9)
NEUT#: 5.6 10*3/uL (ref 1.5–6.5)
NEUT%: 66.7 % (ref 39.0–75.0)
Platelets: 210 10*3/uL (ref 140–400)
WBC: 8.4 10*3/uL (ref 4.0–10.3)
lymph#: 1.7 10*3/uL (ref 0.9–3.3)

## 2011-07-20 ENCOUNTER — Encounter (INDEPENDENT_AMBULATORY_CARE_PROVIDER_SITE_OTHER): Payer: Self-pay | Admitting: General Surgery

## 2011-07-20 ENCOUNTER — Encounter (INDEPENDENT_AMBULATORY_CARE_PROVIDER_SITE_OTHER): Payer: Medicare Other | Admitting: General Surgery

## 2011-07-20 ENCOUNTER — Ambulatory Visit (INDEPENDENT_AMBULATORY_CARE_PROVIDER_SITE_OTHER): Payer: Medicare Other | Admitting: General Surgery

## 2011-07-20 VITALS — BP 132/68 | HR 58 | Temp 98.0°F

## 2011-07-20 DIAGNOSIS — C182 Malignant neoplasm of ascending colon: Secondary | ICD-10-CM

## 2011-07-23 NOTE — Progress Notes (Signed)
HISTORY: Pt is doing better with his appetite.  His incision is healing rapidly.  He is doing well on his chemotherapy.     PERTINENT REVIEW OF SYSTEMS: Otherwise negative times 11.     EXAM: Head: Normocephalic and atraumatic.  Eyes:  Conjunctivae are normal. Pupils are equal, round, and reactive to light. No scleral icterus.  Neck:  Normal range of motion. Neck supple. No tracheal deviation present. No thyromegaly present.  Resp: No respiratory distress, normal effort. Abd: Soft. Bowel sounds are normal. Abdomen is soft, non distended and non tender No masses are palpable.  There is no rebound and no guarding. Midline incision in continuing to heal.  Looks much better.   Neurological: Alert and oriented to person, place, and time. Coordination is stable.  Skin: Skin is warm and dry. No rash noted. No diaphoretic. No erythema. No pallor.  Psychiatric: Normal mood and affect. Normal behavior. Judgment and thought content normal.     Stage IV cancer of cecum T4b N2b M1, peritoneal mets Doing well Follow up in 3 months.       Maudry Diego, MD Surgical Oncology, General & Endocrine Surgery [facility]  [name]., MD [name]., MD

## 2011-07-23 NOTE — Assessment & Plan Note (Signed)
Doing well    Follow up in 3 months

## 2011-07-26 ENCOUNTER — Telehealth (INDEPENDENT_AMBULATORY_CARE_PROVIDER_SITE_OTHER): Payer: Self-pay

## 2011-07-26 NOTE — Telephone Encounter (Signed)
Nurse Jasmine December called to state that when she removed the dressing it had a small black suture on it.  They have been doing wet to dry dsg changes and the wound is about healed.  This is just a FYI.

## 2011-08-06 ENCOUNTER — Other Ambulatory Visit: Payer: Self-pay | Admitting: Oncology

## 2011-08-06 ENCOUNTER — Encounter (HOSPITAL_BASED_OUTPATIENT_CLINIC_OR_DEPARTMENT_OTHER): Payer: Medicare Other | Admitting: Oncology

## 2011-08-06 DIAGNOSIS — C180 Malignant neoplasm of cecum: Secondary | ICD-10-CM

## 2011-08-06 DIAGNOSIS — I2510 Atherosclerotic heart disease of native coronary artery without angina pectoris: Secondary | ICD-10-CM

## 2011-08-06 DIAGNOSIS — I080 Rheumatic disorders of both mitral and aortic valves: Secondary | ICD-10-CM

## 2011-08-06 DIAGNOSIS — D509 Iron deficiency anemia, unspecified: Secondary | ICD-10-CM

## 2011-08-06 LAB — CBC WITH DIFFERENTIAL/PLATELET
BASO%: 0.2 % (ref 0.0–2.0)
Basophils Absolute: 0 10*3/uL (ref 0.0–0.1)
EOS%: 3 % (ref 0.0–7.0)
HCT: 33.7 % — ABNORMAL LOW (ref 38.4–49.9)
HGB: 11.3 g/dL — ABNORMAL LOW (ref 13.0–17.1)
LYMPH%: 18.9 % (ref 14.0–49.0)
MCH: 32.4 pg (ref 27.2–33.4)
MCHC: 33.5 g/dL (ref 32.0–36.0)
MCV: 96.7 fL (ref 79.3–98.0)
NEUT%: 65.7 % (ref 39.0–75.0)
Platelets: 200 10*3/uL (ref 140–400)
lymph#: 1.7 10*3/uL (ref 0.9–3.3)

## 2011-08-06 LAB — COMPREHENSIVE METABOLIC PANEL
ALT: 19 U/L (ref 0–53)
AST: 29 U/L (ref 0–37)
BUN: 11 mg/dL (ref 6–23)
Calcium: 9.4 mg/dL (ref 8.4–10.5)
Chloride: 106 mEq/L (ref 96–112)
Creatinine, Ser: 0.67 mg/dL (ref 0.50–1.35)
Total Bilirubin: 0.5 mg/dL (ref 0.3–1.2)

## 2011-08-07 LAB — CEA: CEA: 2.7 ng/mL (ref 0.0–5.0)

## 2011-08-22 ENCOUNTER — Other Ambulatory Visit: Payer: Self-pay | Admitting: Oncology

## 2011-08-22 ENCOUNTER — Encounter (HOSPITAL_BASED_OUTPATIENT_CLINIC_OR_DEPARTMENT_OTHER): Payer: Medicare Other | Admitting: Oncology

## 2011-08-22 DIAGNOSIS — C180 Malignant neoplasm of cecum: Secondary | ICD-10-CM

## 2011-08-22 DIAGNOSIS — I2510 Atherosclerotic heart disease of native coronary artery without angina pectoris: Secondary | ICD-10-CM

## 2011-08-22 DIAGNOSIS — D509 Iron deficiency anemia, unspecified: Secondary | ICD-10-CM

## 2011-08-22 DIAGNOSIS — I080 Rheumatic disorders of both mitral and aortic valves: Secondary | ICD-10-CM

## 2011-08-22 LAB — CBC WITH DIFFERENTIAL/PLATELET
BASO%: 0.3 % (ref 0.0–2.0)
EOS%: 2.3 % (ref 0.0–7.0)
HCT: 36.6 % — ABNORMAL LOW (ref 38.4–49.9)
LYMPH%: 18.7 % (ref 14.0–49.0)
MCH: 32.1 pg (ref 27.2–33.4)
MCHC: 32.9 g/dL (ref 32.0–36.0)
NEUT%: 67.6 % (ref 39.0–75.0)
Platelets: 169 10*3/uL (ref 140–400)
RBC: 3.76 10*6/uL — ABNORMAL LOW (ref 4.20–5.82)
WBC: 8.9 10*3/uL (ref 4.0–10.3)

## 2011-08-22 LAB — COMPREHENSIVE METABOLIC PANEL
ALT: 21 U/L (ref 0–53)
AST: 27 U/L (ref 0–37)
Alkaline Phosphatase: 104 U/L (ref 39–117)
Creatinine, Ser: 0.73 mg/dL (ref 0.50–1.35)
Sodium: 142 mEq/L (ref 135–145)
Total Bilirubin: 0.8 mg/dL (ref 0.3–1.2)
Total Protein: 6.4 g/dL (ref 6.0–8.3)

## 2011-08-30 ENCOUNTER — Telehealth (INDEPENDENT_AMBULATORY_CARE_PROVIDER_SITE_OTHER): Payer: Self-pay

## 2011-08-30 NOTE — Telephone Encounter (Signed)
The home health nurse called today to report that the patient has been discharged from care.  His wound has completely healed and the pt is doing well.  I will contact the pt's wife to schedule a LTFU with Dr. Donell Beers.

## 2011-09-05 ENCOUNTER — Other Ambulatory Visit: Payer: Self-pay | Admitting: Oncology

## 2011-09-05 ENCOUNTER — Encounter (HOSPITAL_BASED_OUTPATIENT_CLINIC_OR_DEPARTMENT_OTHER): Payer: Medicare Other | Admitting: Oncology

## 2011-09-05 DIAGNOSIS — D509 Iron deficiency anemia, unspecified: Secondary | ICD-10-CM

## 2011-09-05 DIAGNOSIS — I080 Rheumatic disorders of both mitral and aortic valves: Secondary | ICD-10-CM

## 2011-09-05 DIAGNOSIS — C180 Malignant neoplasm of cecum: Secondary | ICD-10-CM

## 2011-09-05 DIAGNOSIS — I2510 Atherosclerotic heart disease of native coronary artery without angina pectoris: Secondary | ICD-10-CM

## 2011-09-05 LAB — CBC WITH DIFFERENTIAL/PLATELET
BASO%: 0.2 % (ref 0.0–2.0)
Basophils Absolute: 0 10*3/uL (ref 0.0–0.1)
EOS%: 1.8 % (ref 0.0–7.0)
HCT: 33.7 % — ABNORMAL LOW (ref 38.4–49.9)
HGB: 11.2 g/dL — ABNORMAL LOW (ref 13.0–17.1)
LYMPH%: 22.2 % (ref 14.0–49.0)
MCH: 32.6 pg (ref 27.2–33.4)
MCHC: 33.1 g/dL (ref 32.0–36.0)
NEUT%: 64.7 % (ref 39.0–75.0)
Platelets: 184 10*3/uL (ref 140–400)
lymph#: 1.6 10*3/uL (ref 0.9–3.3)

## 2011-09-05 LAB — COMPREHENSIVE METABOLIC PANEL
AST: 21 U/L (ref 0–37)
BUN: 15 mg/dL (ref 6–23)
CO2: 29 mEq/L (ref 19–32)
Calcium: 9.5 mg/dL (ref 8.4–10.5)
Chloride: 107 mEq/L (ref 96–112)
Creatinine, Ser: 0.78 mg/dL (ref 0.50–1.35)
Total Bilirubin: 0.9 mg/dL (ref 0.3–1.2)

## 2011-09-05 LAB — LACTATE DEHYDROGENASE: LDH: 147 U/L (ref 94–250)

## 2011-09-10 HISTORY — PX: OTHER SURGICAL HISTORY: SHX169

## 2011-09-18 ENCOUNTER — Other Ambulatory Visit: Payer: Self-pay | Admitting: Oncology

## 2011-09-18 ENCOUNTER — Encounter (HOSPITAL_BASED_OUTPATIENT_CLINIC_OR_DEPARTMENT_OTHER): Payer: Medicare Other | Admitting: Oncology

## 2011-09-18 DIAGNOSIS — C180 Malignant neoplasm of cecum: Secondary | ICD-10-CM

## 2011-09-18 DIAGNOSIS — C189 Malignant neoplasm of colon, unspecified: Secondary | ICD-10-CM

## 2011-09-18 DIAGNOSIS — I2510 Atherosclerotic heart disease of native coronary artery without angina pectoris: Secondary | ICD-10-CM

## 2011-09-18 DIAGNOSIS — D509 Iron deficiency anemia, unspecified: Secondary | ICD-10-CM

## 2011-09-18 DIAGNOSIS — I080 Rheumatic disorders of both mitral and aortic valves: Secondary | ICD-10-CM

## 2011-09-18 LAB — CBC WITH DIFFERENTIAL/PLATELET
Basophils Absolute: 0 10*3/uL (ref 0.0–0.1)
Eosinophils Absolute: 0.2 10*3/uL (ref 0.0–0.5)
HCT: 33.4 % — ABNORMAL LOW (ref 38.4–49.9)
HGB: 11.1 g/dL — ABNORMAL LOW (ref 13.0–17.1)
LYMPH%: 21.3 % (ref 14.0–49.0)
MCH: 33.3 pg (ref 27.2–33.4)
MCHC: 33.3 g/dL (ref 32.0–36.0)
MONO%: 13.9 % (ref 0.0–14.0)
WBC: 7.4 10*3/uL (ref 4.0–10.3)

## 2011-09-24 ENCOUNTER — Ambulatory Visit (HOSPITAL_COMMUNITY)
Admission: RE | Admit: 2011-09-24 | Discharge: 2011-09-24 | Disposition: A | Discharge status: Home / Self Care | Payer: Medicare Other | Source: Ambulatory Visit | Attending: Oncology | Admitting: Oncology

## 2011-09-24 DIAGNOSIS — K5900 Constipation, unspecified: Secondary | ICD-10-CM | POA: Insufficient documentation

## 2011-09-24 DIAGNOSIS — J90 Pleural effusion, not elsewhere classified: Secondary | ICD-10-CM | POA: Insufficient documentation

## 2011-09-24 DIAGNOSIS — C189 Malignant neoplasm of colon, unspecified: Secondary | ICD-10-CM | POA: Insufficient documentation

## 2011-09-24 DIAGNOSIS — R188 Other ascites: Secondary | ICD-10-CM | POA: Insufficient documentation

## 2011-09-24 DIAGNOSIS — I2510 Atherosclerotic heart disease of native coronary artery without angina pectoris: Secondary | ICD-10-CM | POA: Insufficient documentation

## 2011-09-24 DIAGNOSIS — I517 Cardiomegaly: Secondary | ICD-10-CM | POA: Insufficient documentation

## 2011-09-24 DIAGNOSIS — I2584 Coronary atherosclerosis due to calcified coronary lesion: Secondary | ICD-10-CM | POA: Insufficient documentation

## 2011-09-24 DIAGNOSIS — K439 Ventral hernia without obstruction or gangrene: Secondary | ICD-10-CM | POA: Insufficient documentation

## 2011-09-24 MED ORDER — IOHEXOL 300 MG/ML  SOLN
100.0000 mL | Freq: Once | INTRAMUSCULAR | Status: AC | PRN
  Administered 2011-09-24: 100 mL via INTRAVENOUS

## 2011-10-01 ENCOUNTER — Ambulatory Visit (INDEPENDENT_AMBULATORY_CARE_PROVIDER_SITE_OTHER): Payer: Medicare Other | Admitting: General Surgery

## 2011-10-01 ENCOUNTER — Encounter (INDEPENDENT_AMBULATORY_CARE_PROVIDER_SITE_OTHER): Payer: Self-pay | Admitting: General Surgery

## 2011-10-01 VITALS — BP 138/78 | HR 88 | Temp 98.6°F | Resp 24 | Ht 67.0 in | Wt 137.0 lb

## 2011-10-01 DIAGNOSIS — C182 Malignant neoplasm of ascending colon: Secondary | ICD-10-CM

## 2011-10-01 NOTE — Progress Notes (Signed)
HISTORY: Pt's wound has completely healed over.  He is not having any pain.  He continues to tolerate oral xeloda.  He recently underwent CT scan which showed improvement in peritoneal nodularity.  He has continued to gain weight.  His appetite is good.  He denies nausea, vomiting, or constipation.     PERTINENT REVIEW OF SYSTEMS: Otherwise negative.    EXAM: Head: Normocephalic and atraumatic.  Eyes:  Conjunctivae are normal. Pupils are equal, round, and reactive to light. No scleral icterus. R eye with lower lid with scarring and redness (unchanged) Resp: No respiratory distress, normal effort. Abd:  Abdomen is soft, non distended and non tender. There is no rebound and no guarding. Wound healed, abdominal hernia is present, reducible.   Neurological: Alert and oriented to person, place, and time. Uses walker.   Skin: Skin is warm and dry. No rash noted. No diaphoretic. No erythema. No pallor.  Psychiatric: Normal mood and affect. Normal behavior. Judgment and thought content normal.      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 Continue oral chemo per Dr. [name]. Follow up with me in 6 months. Abdominal binder as needed for hernia. OK to liberalize activity.         Maudry Diego, MD Surgical Oncology, General & Endocrine Surgery [facility]  [name]., MD [name]., MD

## 2011-10-01 NOTE — Assessment & Plan Note (Signed)
Doing well. Continue oral chemo per Dr. [name]. Follow up with me in 6 months. Abdominal binder as needed for hernia. OK to liberalize activity.

## 2011-10-02 ENCOUNTER — Encounter: Payer: Self-pay | Admitting: *Deleted

## 2011-10-02 NOTE — Progress Notes (Unsigned)
Received fax from [employer] confirming 10/01/11 shipment of xeloda for delivery next business day. [facility]

## 2011-10-16 ENCOUNTER — Ambulatory Visit: Payer: Medicare Other | Admitting: Hematology and Oncology

## 2011-10-16 ENCOUNTER — Other Ambulatory Visit: Payer: Self-pay | Admitting: Oncology

## 2011-10-16 ENCOUNTER — Ambulatory Visit (HOSPITAL_BASED_OUTPATIENT_CLINIC_OR_DEPARTMENT_OTHER): Payer: Medicare Other | Admitting: Nurse Practitioner

## 2011-10-16 ENCOUNTER — Other Ambulatory Visit (HOSPITAL_BASED_OUTPATIENT_CLINIC_OR_DEPARTMENT_OTHER): Payer: Medicare Other

## 2011-10-16 VITALS — BP 129/51 | HR 87 | Temp 98.0°F | Wt 137.9 lb

## 2011-10-16 DIAGNOSIS — I4891 Unspecified atrial fibrillation: Secondary | ICD-10-CM

## 2011-10-16 DIAGNOSIS — C180 Malignant neoplasm of cecum: Secondary | ICD-10-CM

## 2011-10-16 DIAGNOSIS — D649 Anemia, unspecified: Secondary | ICD-10-CM

## 2011-10-16 DIAGNOSIS — I10 Essential (primary) hypertension: Secondary | ICD-10-CM

## 2011-10-16 DIAGNOSIS — I080 Rheumatic disorders of both mitral and aortic valves: Secondary | ICD-10-CM

## 2011-10-16 LAB — COMPREHENSIVE METABOLIC PANEL
ALT: 10 U/L (ref 0–53)
Albumin: 3.7 g/dL (ref 3.5–5.2)
CO2: 25 mEq/L (ref 19–32)
Calcium: 9.7 mg/dL (ref 8.4–10.5)
Chloride: 106 mEq/L (ref 96–112)
Creatinine, Ser: 0.76 mg/dL (ref 0.50–1.35)
Potassium: 4 mEq/L (ref 3.5–5.3)

## 2011-10-16 LAB — CBC WITH DIFFERENTIAL/PLATELET
BASO%: 0 % (ref 0.0–2.0)
Basophils Absolute: 0 10*3/uL (ref 0.0–0.1)
HCT: 34.2 % — ABNORMAL LOW (ref 38.4–49.9)
HGB: 11.2 g/dL — ABNORMAL LOW (ref 13.0–17.1)
MCHC: 32.6 g/dL (ref 32.0–36.0)
MONO#: 0.7 10*3/uL (ref 0.1–0.9)
NEUT%: 69.6 % (ref 39.0–75.0)
WBC: 6.9 10*3/uL (ref 4.0–10.3)
lymph#: 1.3 10*3/uL (ref 0.9–3.3)

## 2011-10-16 LAB — LACTATE DEHYDROGENASE: LDH: 158 U/L (ref 94–250)

## 2011-10-16 NOTE — Progress Notes (Signed)
OFFICE PROGRESS NOTE  Interval history:  [name] is an 77 year old man with colon cancer. At the time of surgery April 2012 he was found to have peritoneal spread. He is on active treatment with single agent Xeloda. Restaging CT evaluation 09/24/2011 showed resolution of the previously noted peritoneal nodularity. There was scattered increased number of normal-sized lymph nodes most notable mid to upper retroperitoneal region similar to prior exams. There was question of a new 4 mm lesion within the left lobe of the liver.   [name] feels well. He has a good appetite and good energy level. He remains very active. He denies mouth sores. No nausea or vomiting. No diarrhea. He notes the week he is taking Xeloda he has frequent formed stools. The week off of Xeloda bowel habits return to baseline. He denies hand or foot pain or redness. He notes that his hands are dry. He is applying lotion liberally.   Objective: Blood pressure 129/51, pulse 87, temperature 98 F (36.7 C), temperature source Oral, weight 137 lb 14.4 oz (62.551 kg).  Oropharynx is without thrush or ulceration. Mucous membranes are pink and moist. No palpable cervical, supraclavicular, axillary or inguinal lymph nodes. Lungs are clear. Slightly irregular cardiac rhythm. 2/6 systolic murmur. Abdomen is soft and nontender. No hepatomegaly. Trace lower leg edema bilaterally left greater than right. Motor strength 5 over 5.   Lab Results: Hemoglobin 11.2 white count 6.9 platelet count 193,000 absolute neutrophil count 4.8 Chemistry panel LDH and CEA pending     Studies/Results: No results found.  Medications: I have reviewed the patient's current medications.  Assessment/Plan: 1.     Locally advanced adenocarcinoma of the colon diagnosed in April of this year.  He was found to have diffuse intra-abdominal carcinomatosis.  The primary tumor had perforated into the retroperitoneum with invasion of the small bowel.  There was  peritoneal spread to the surface of the rectum and additional pelvic nodules.  The liver appeared normal to inspection and palpation at time of surgery 03/21/2011.  He had a right hemicolectomy, small bowel resection and biopsy of a peritoneal nodule.  Histology was moderately differentiated adenocarcinoma.  There was positive vascular and lymphatic invasion.  Twelve of 29 lymph nodes were positive. [name] was started on Xeloda.  For the first 2 cycles he took 750 mg per meter square twice daily 2 weeks on/1 week off (1500 mg q.a.m. 1000 mg q.p.m.).  He tolerated the first 2 cycles well.  The dose was escalated to 1500 mg twice daily beginning 07/18/2011.  He subsequently developed significant mucositis and hand-foot syndrome.  Following his visit with Dr. [name] on 08/06/2011 the dose was changed to 1500 mg q.a.m. and 1000 mg q.p.m. on a 1 week on/1 week off schedule with improved tolerance. Restaging CT evaluation 09/24/2011 showed resolution of the previously noted peritoneal nodularity.  2.Coronary artery disease, status post bypass surgery.  3.Valvular heart disease, both mitral stenosis and aortic stenosis.  4.Essential hypertension.  5.Chronic atrial fibrillation.  6.Cerebrovascular disease, status post right carotid bypass surgery.  7.Status post excision of basal cell carcinoma from right ear.  8.Chronic anemia, presumed secondary to small bowel AVMs.  9.Peripheral vascular disease.  Disposition-[name]. Plan to continue Xeloda at the current dose and schedule. He will return for a followup visit with Dr. [name] 11/13/2011. He will contact the office in the interim with any problems.   Lonna Cobb ANP/GNP-BC

## 2011-10-23 ENCOUNTER — Encounter: Payer: Self-pay | Admitting: *Deleted

## 2011-10-23 NOTE — Progress Notes (Signed)
Received fax from [employer] reporting that xeloda has been shipped 10/22/11 for delivery on the next business day.

## 2011-10-24 ENCOUNTER — Encounter: Payer: Self-pay | Admitting: *Deleted

## 2011-10-24 NOTE — Progress Notes (Signed)
Informed pt's wife that labs done 10/16/11 good per Dr. [name] & copy sent electronically to Dr. Sherwood Gambler.

## 2011-11-13 ENCOUNTER — Encounter: Payer: Self-pay | Admitting: Oncology

## 2011-11-13 ENCOUNTER — Other Ambulatory Visit: Payer: Self-pay | Admitting: Oncology

## 2011-11-13 ENCOUNTER — Ambulatory Visit (HOSPITAL_BASED_OUTPATIENT_CLINIC_OR_DEPARTMENT_OTHER): Payer: Medicare Other | Admitting: Oncology

## 2011-11-13 ENCOUNTER — Other Ambulatory Visit (HOSPITAL_BASED_OUTPATIENT_CLINIC_OR_DEPARTMENT_OTHER): Payer: Medicare Other | Admitting: Lab

## 2011-11-13 VITALS — BP 149/64 | HR 92 | Temp 97.3°F | Ht 67.0 in | Wt 140.6 lb

## 2011-11-13 DIAGNOSIS — I2510 Atherosclerotic heart disease of native coronary artery without angina pectoris: Secondary | ICD-10-CM

## 2011-11-13 DIAGNOSIS — I482 Chronic atrial fibrillation, unspecified: Secondary | ICD-10-CM

## 2011-11-13 DIAGNOSIS — C180 Malignant neoplasm of cecum: Secondary | ICD-10-CM

## 2011-11-13 DIAGNOSIS — C44211 Basal cell carcinoma of skin of unspecified ear and external auricular canal: Secondary | ICD-10-CM

## 2011-11-13 DIAGNOSIS — I10 Essential (primary) hypertension: Secondary | ICD-10-CM

## 2011-11-13 DIAGNOSIS — H9193 Unspecified hearing loss, bilateral: Secondary | ICD-10-CM

## 2011-11-13 DIAGNOSIS — I350 Nonrheumatic aortic (valve) stenosis: Secondary | ICD-10-CM

## 2011-11-13 DIAGNOSIS — Z955 Presence of coronary angioplasty implant and graft: Secondary | ICD-10-CM

## 2011-11-13 DIAGNOSIS — I6529 Occlusion and stenosis of unspecified carotid artery: Secondary | ICD-10-CM

## 2011-11-13 DIAGNOSIS — I080 Rheumatic disorders of both mitral and aortic valves: Secondary | ICD-10-CM | POA: Insufficient documentation

## 2011-11-13 DIAGNOSIS — C182 Malignant neoplasm of ascending colon: Secondary | ICD-10-CM

## 2011-11-13 DIAGNOSIS — Z951 Presence of aortocoronary bypass graft: Secondary | ICD-10-CM | POA: Insufficient documentation

## 2011-11-13 DIAGNOSIS — I739 Peripheral vascular disease, unspecified: Secondary | ICD-10-CM

## 2011-11-13 DIAGNOSIS — J90 Pleural effusion, not elsewhere classified: Secondary | ICD-10-CM

## 2011-11-13 DIAGNOSIS — C786 Secondary malignant neoplasm of retroperitoneum and peritoneum: Secondary | ICD-10-CM

## 2011-11-13 HISTORY — DX: Essential (primary) hypertension: I10

## 2011-11-13 HISTORY — DX: Unspecified hearing loss, bilateral: H91.93

## 2011-11-13 HISTORY — DX: Chronic atrial fibrillation, unspecified: I48.20

## 2011-11-13 HISTORY — DX: Presence of coronary angioplasty implant and graft: Z95.5

## 2011-11-13 HISTORY — DX: Nonrheumatic aortic (valve) stenosis: I35.0

## 2011-11-13 HISTORY — DX: Basal cell carcinoma of skin of unspecified ear and external auricular canal: C44.211

## 2011-11-13 HISTORY — DX: Peripheral vascular disease, unspecified: I73.9

## 2011-11-13 HISTORY — DX: Rheumatic disorders of both mitral and aortic valves: I08.0

## 2011-11-13 LAB — LACTATE DEHYDROGENASE: LDH: 148 U/L (ref 94–250)

## 2011-11-13 LAB — CBC WITH DIFFERENTIAL/PLATELET
BASO%: 0.2 % (ref 0.0–2.0)
LYMPH%: 18.6 % (ref 14.0–49.0)
MCHC: 33.2 g/dL (ref 32.0–36.0)
MONO#: 1 10*3/uL — ABNORMAL HIGH (ref 0.1–0.9)
Platelets: 175 10*3/uL (ref 140–400)
RBC: 3.46 10*6/uL — ABNORMAL LOW (ref 4.20–5.82)
RDW: 22.2 % — ABNORMAL HIGH (ref 11.0–14.6)
WBC: 6.4 10*3/uL (ref 4.0–10.3)

## 2011-11-13 LAB — COMPREHENSIVE METABOLIC PANEL
Albumin: 3.7 g/dL (ref 3.5–5.2)
Alkaline Phosphatase: 89 U/L (ref 39–117)
CO2: 28 mEq/L (ref 19–32)
Glucose, Bld: 124 mg/dL — ABNORMAL HIGH (ref 70–99)
Potassium: 4.1 mEq/L (ref 3.5–5.3)
Sodium: 140 mEq/L (ref 135–145)
Total Protein: 6.2 g/dL (ref 6.0–8.3)

## 2011-11-13 LAB — CEA: CEA: 1.9 ng/mL (ref 0.0–5.0)

## 2011-11-13 NOTE — Progress Notes (Signed)
Followup visit for this 77 year old man with colon cancer stage IV by virtue of peritoneal metastases at diagnosis in April of 2012. I Murray tumor in the cecum resected. He has multiple underlying medical problems including coronary artery disease status post 5 vessel bypass, mitral and aortic valvular disease, cerebrovascular disease status post right carotid endarterectomy, and peripheral vascular disease. He is nearly deaf from working in a [employer] in the past. He had prolonged recovery period after his surgery. We didn't get started on a chemotherapy program until July. He is receiving single agent Xeloda. Despite attenuated doses, he had significant toxicity with a severe hand foot syndrome and mucositis. I change and to a 1 week on 1 week off schedule. He has had no problems since then and is now tolerating the drug extremely well. We are seeing evidence of disease response with resolution of peritoneal metastases on most recent CT scan done 09/24/11. There are a number of abnormalities on the scan related to his other medical problems. There is cardiomegaly with calcified coronary arteries and calcified abdominal aorta. He has a large ventral hernia. The enlarged and partially calcified prostate gland. Small bilateral pleural effusions and a small amount of ascites. Multilobulated appearance of the liver. A chronically dislocated right femoral head. The pleural effusions are larger particularly on the left compared with the may 2011 study. Not clear if these are related to his cardiac disease. The fact that the peritoneal studding has resolved suggests a response to chemotherapy and makes it less likely that the pleural effusions are related to his colon cancer.  Review of systems: He currently denies any dyspnea or chest pain. No abdominal pain. No change in bowel habit. No pain.  Exam: Pharynx no erythema exudate or ulcer. Lung exam overall clear to auscultation and resonant to  percussion. Cardiac exam: Regular cardiac rhythm Abdominal exam: Soft nontender no mass no organomegaly large ventral hernia extending down past the umbilicus. Easily reducible. 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 Neurologic: Very hard of hearing. Motor strength 5 over 5.  Lab: Random glucose 124 liver functions normal hemoglobin 11 stable CEA remains normal at 1.9 done 12/18.  Impression: #1. 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 He is responding to single agent oral Xeloda chemotherapy. Plan continue the same.  #2. Coronary artery disease status post bypass surgery status post coronary stenting.  #3. Mitral and aortic valvular disease.  #4. Cerebrovascular disease status post right carotid endarterectomy.  #5. Peripheral vascular disease.  #6. Bilateral pleural effusions left greater than right. Cardiac origin? Currently asymptomatic. Plan followup radiographic evaluation.  #7. Sensorineural hearing deficits  D. to Dr. Artis Delay, Franky Macho, Almond Lint, Nanetta Batty

## 2011-11-13 NOTE — Progress Notes (Signed)
Patient ID: Charles Faulkner, male   DOB: 1925-12-02, 77 y.o.   MRN: [id number]

## 2011-11-13 NOTE — Progress Notes (Signed)
Patient ID: Charles Faulkner, male   DOB: 10/23/1926, 77 y.o.   MRN: [id number]  

## 2011-11-14 IMAGING — CT CT ABD-PELV W/ CM
2 of 5 series · 13 of 32 positions shown, 18 images · IV contrast (agent unspecified)
Comparison: 03/14/2011.

***ADDENDUM*** CREATED: 04/11/2011 [DATE]

Initially, the wrong medical service was paged regarding this
patient.
I discussed the salient findings on this case with Dr. Jawich Louisa
by telephone at 7772 hours on 04/11/2011.
CLINICAL DATA: 84-year-old male status post bowel resection.
Nausea, vomiting, abnormal bowel sounds.
CT ABDOMEN AND PELVIS WITH CONTRAST
TECHNIQUE: Multidetector CT imaging of the abdomen and pelvis was
performed following the standard protocol during bolus
administration of intravenous contrast.
Contrast: 80 ml [id number].

[Series 2: routine abdomen · axial · 0.77mm/px · z∈[-385,-55]mm · 7 of 89 slices shown, 12 images]
[im 12/89  soft-tissue]
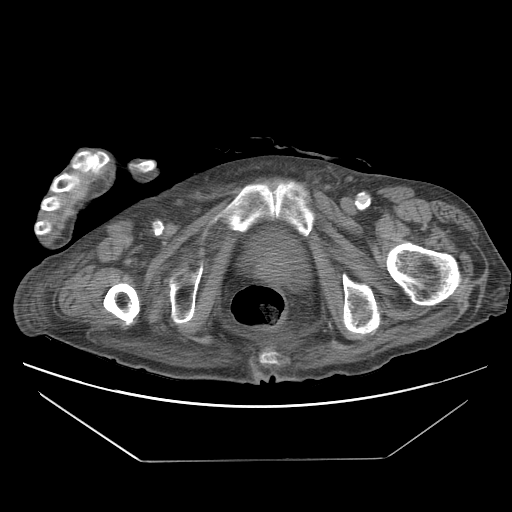
[im 12/89  bone]
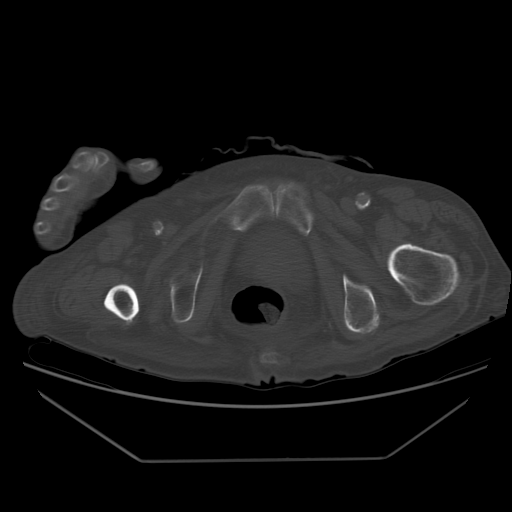
[im 23/89  soft-tissue]
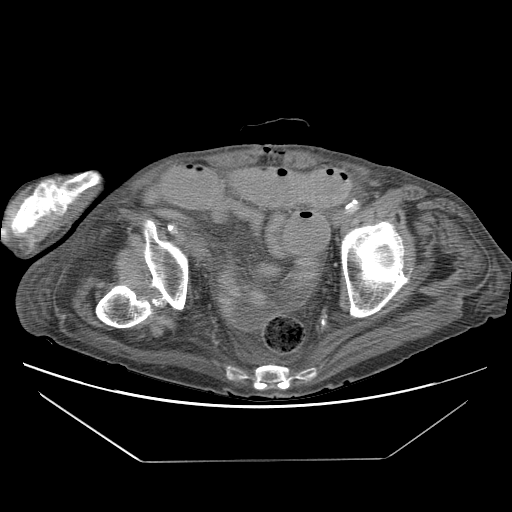
[im 34/89  soft-tissue]
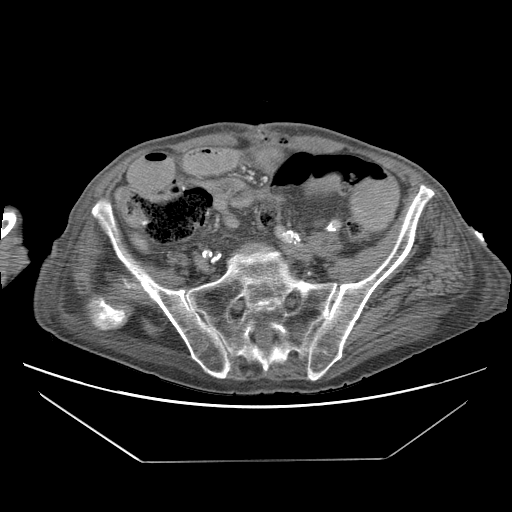
[im 45/89  soft-tissue]
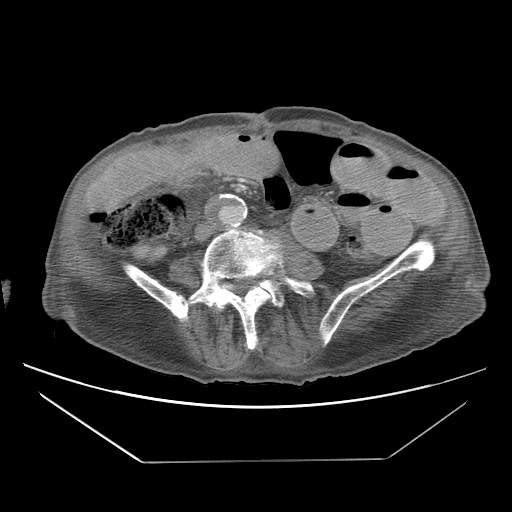
[im 45/89  lung]
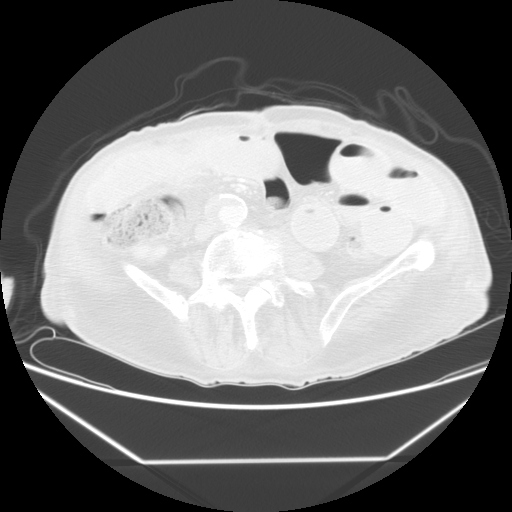
[im 56/89  soft-tissue]
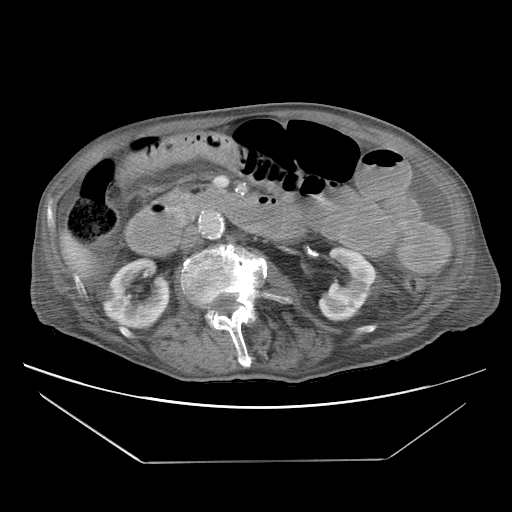
[im 56/89  lung]
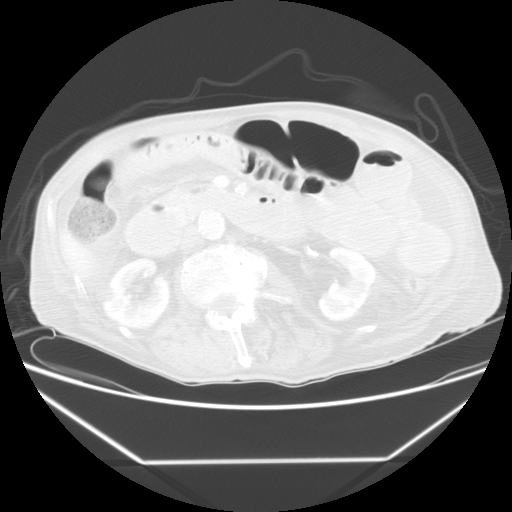
[im 67/89  soft-tissue]
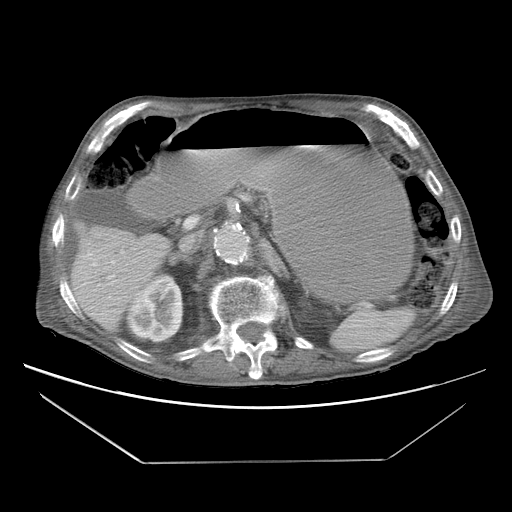
[im 67/89  lung]
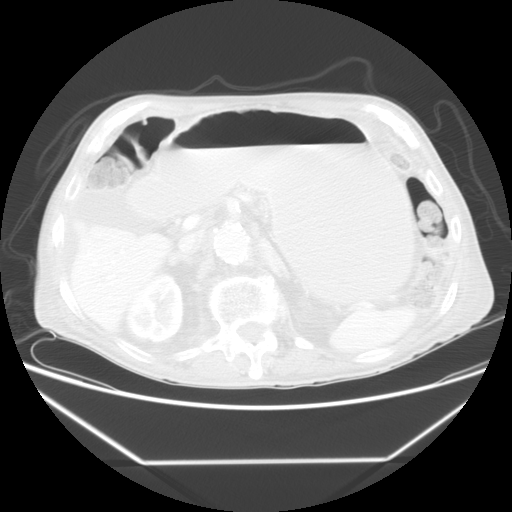
[im 78/89  soft-tissue]
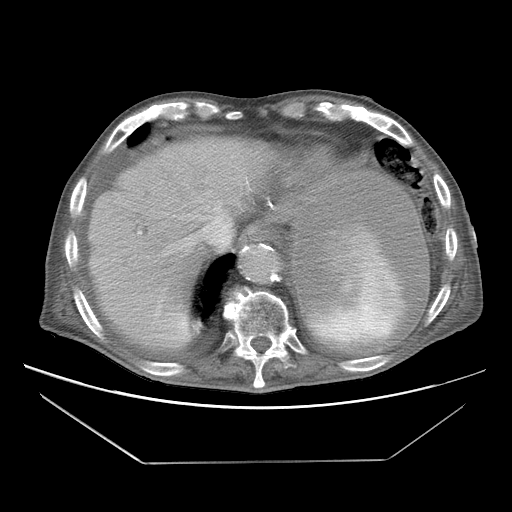
[im 78/89  lung]
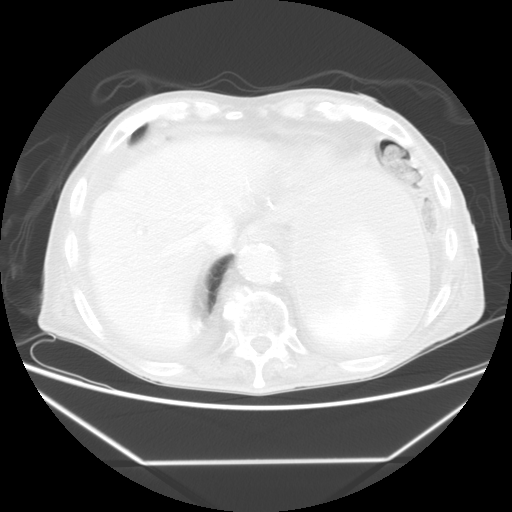

[Series 401: sag · sagittal · 0.88mm/px · 6 of 107 slices shown]
[im 11/107  soft-tissue]
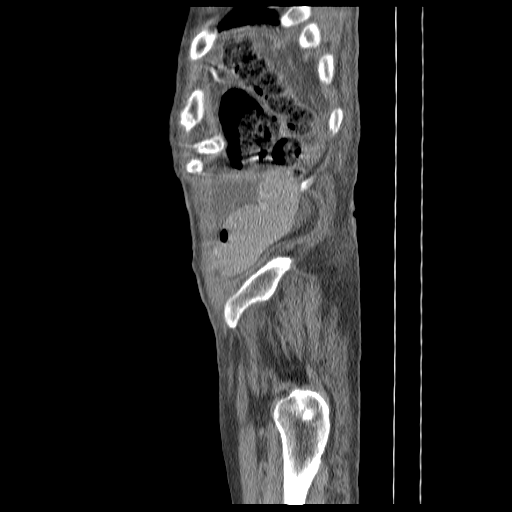
[im 22/107  soft-tissue]
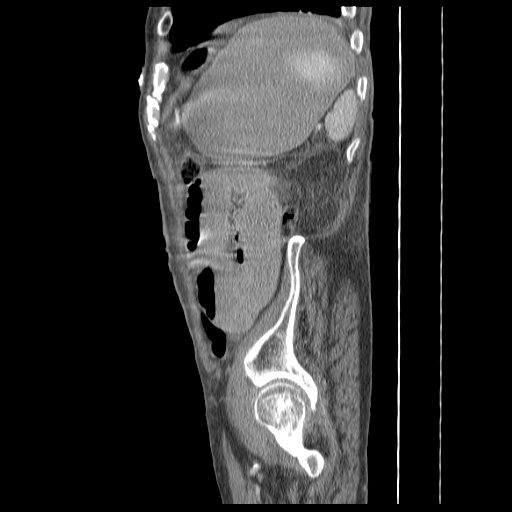
[im 32/107  soft-tissue]
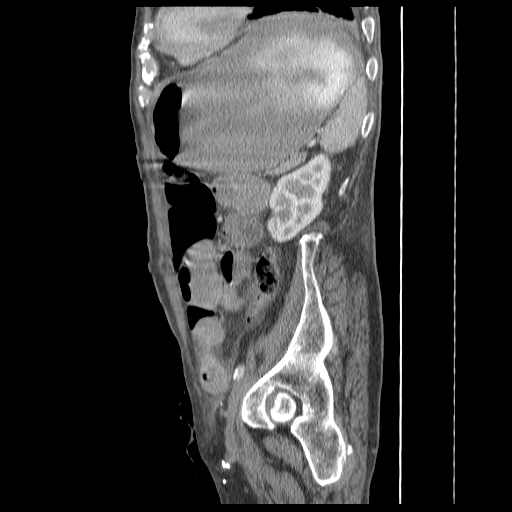
[im 43/107  soft-tissue]
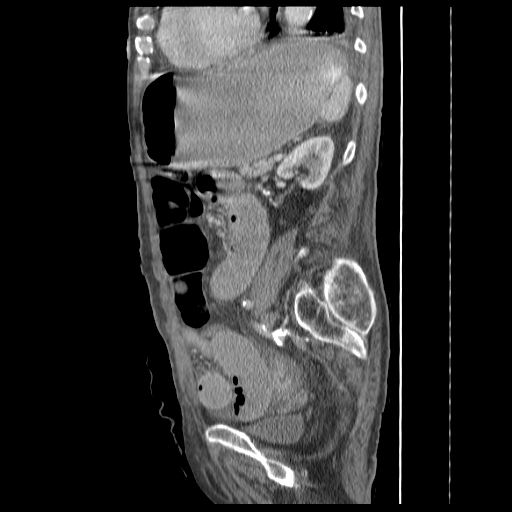
[im 64/107  soft-tissue]
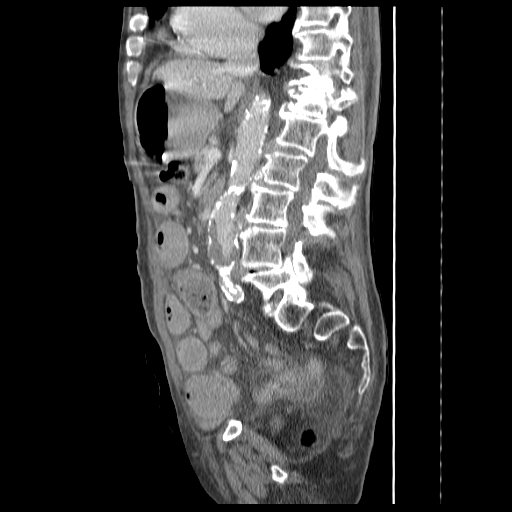
[im 75/107  soft-tissue]
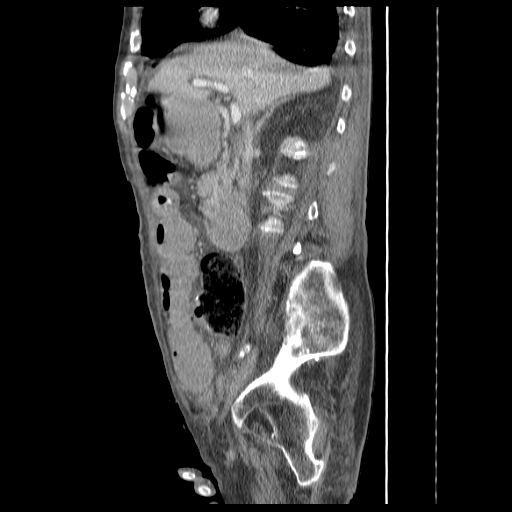

[13 of 32 positions shown; findings below may reference images not displayed]

FINDINGS: Cardiomegaly.  No pericardial effusion.  Small layering
pleural effusions.  Dependent pulmonary atelectasis.  Scoliosis and
advanced degenerative changes in the spine.  Chronic right hip
dislocation. No acute osseous abnormality identified.

Massively distended stomach.  Duodenum is dilated.  Proximal small
bowel loops are dilated up to 38 mm in diameter.  Small bowel
remains dilated into the pelvis, with a relatively abrupt
transition in the right abdomen at the level of the aortoiliac
bifurcation to decompressed loops (series 2 images 49 - 56).  This
is adjacent to the anastomotic line at the right colon.  There are
numerous decompressed distal small bowel loops.  There is a second
anastomosis in the pelvis on series 2 image 68 which may relate to
a small bowel, uncertain.  The distal colon is redundant and
partially gas filled.

Increased ascites.  Mild periportal edema.  Negative gallbladder,
spleen, pancreas and adrenal glands.  Stable kidneys with likely
benign cysts.  Portal venous system appears patent.

Ectatic and calcified aorta with mild infrarenal  enlargement.
Extensive pelvic atherosclerosis.  Bladder relatively decompressed.
Mild presacral stranding.  No mesenteric lymphadenopathy
identified.

Postoperative changes to the ventral abdominal wall.  The lungs may
be healing by secondary intention.  Small volume of subcutaneous
gas but no associated fluid collection.
IMPRESSION: 1.  Small bowel obstruction with massive gastric distention.
Transition point appears to be in the right lower abdomen adjacent
to the cecal anastomotic line and might be on the basis of
adhesions.
Second anastomosis in the deep pelvis is noted without adverse
features.
2.  Increased ascites.  Small pleural effusions.  Presacral
inflammatory changes.
3.  Cardiomegaly and advanced osseous degenerative changes.

I called the floor and spoke to the patient's nurse at [phone] hours.
She provided me that pager information for the on-call physician,
who was paged at that time, and re-paged at [phone] hours.

## 2011-11-15 IMAGING — CR DG CHEST 1V PORT
1 series · 1 of 1 positions shown · non-contrast
Comparison: [DATE]

CLINICAL DATA: PICC line placement

PORTABLE CHEST - 1 VIEW

[view not recorded]
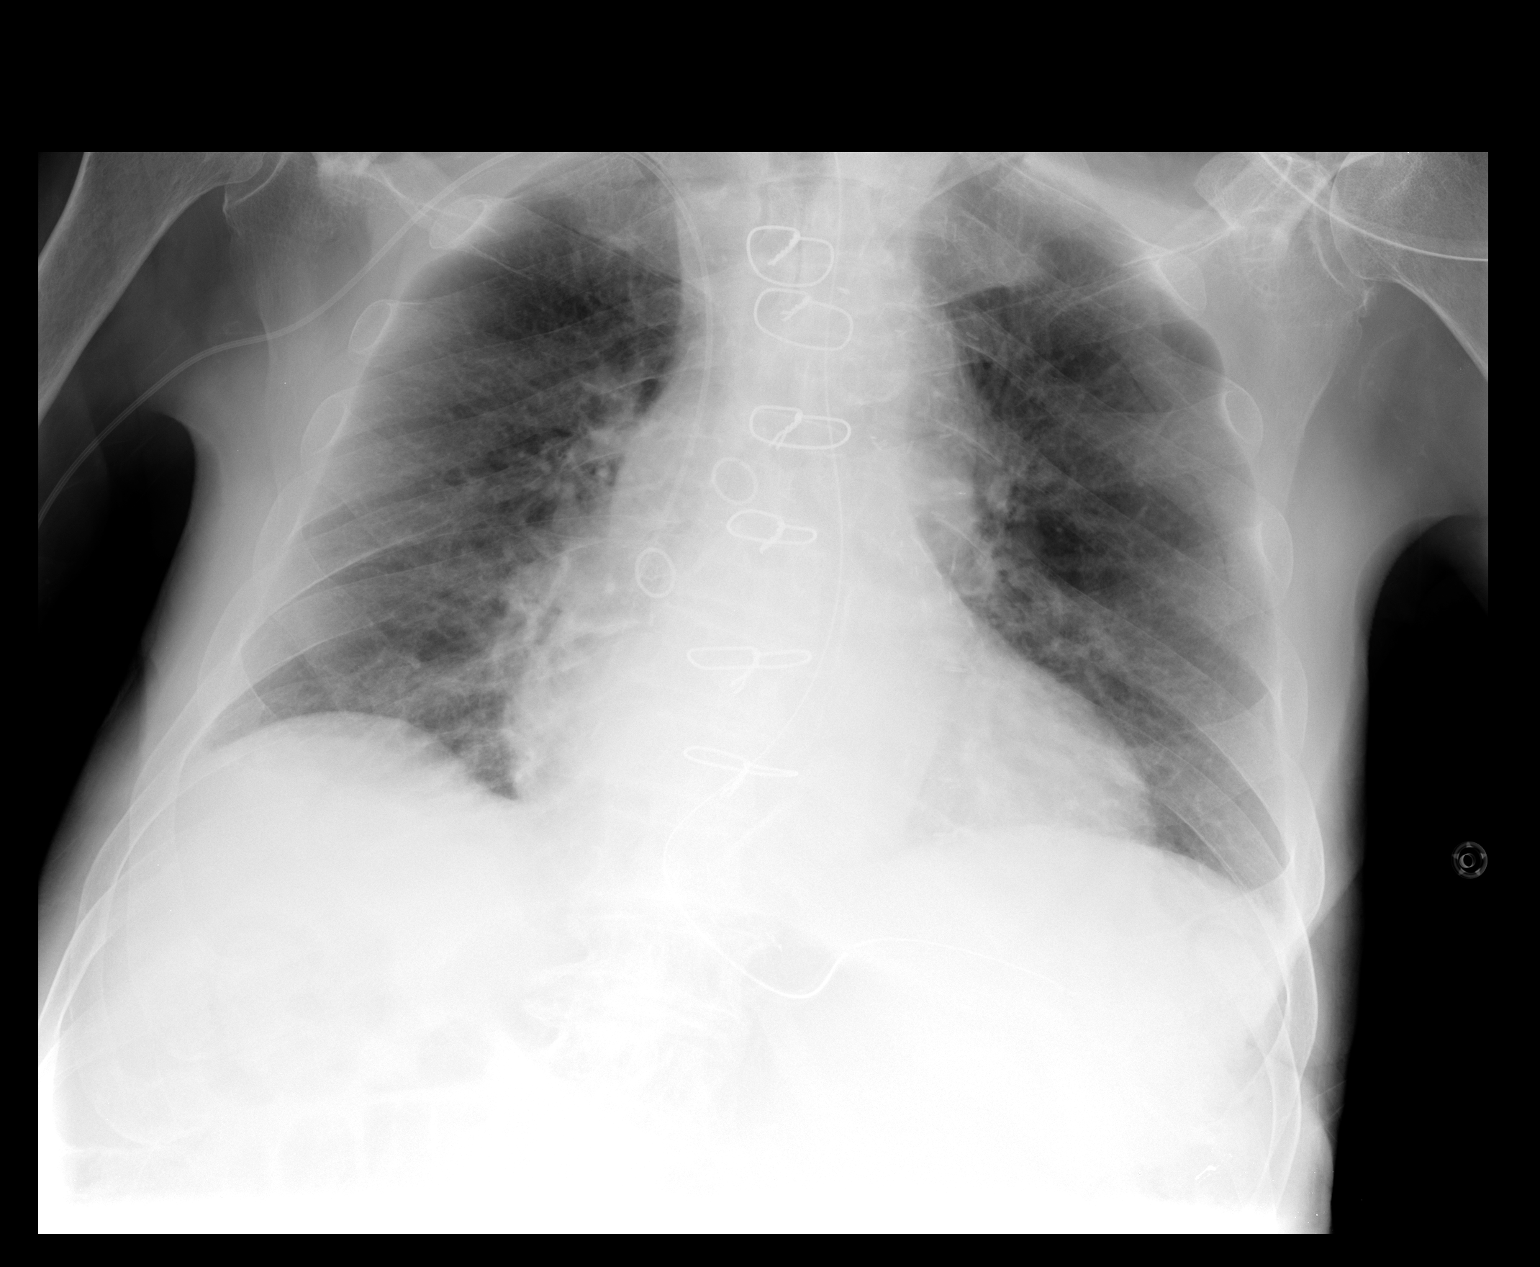

[1 of 1 positions shown; findings below may reference images not displayed]

FINDINGS: Right arm PICC has its tip at the SVC/RA junction.
Nasogastric tube enters the stomach.  There is been previous median
sternotomy and CABG.  The heart is mildly enlarged.  No pulmonary
edema, infiltrate, collapse or effusion.
IMPRESSION: Right arm PICC has its tip at the SVC/RA junction.

## 2011-11-23 ENCOUNTER — Encounter: Payer: Self-pay | Admitting: *Deleted

## 2011-11-23 NOTE — Progress Notes (Signed)
Received confirmation of prescription shipment for xeloda from [facility] to be shipped 11/22/11 & delivery next business day.

## 2011-12-11 ENCOUNTER — Other Ambulatory Visit: Payer: Self-pay | Admitting: Oncology

## 2011-12-11 ENCOUNTER — Other Ambulatory Visit (HOSPITAL_BASED_OUTPATIENT_CLINIC_OR_DEPARTMENT_OTHER): Payer: Medicare Other | Admitting: Lab

## 2011-12-11 DIAGNOSIS — C180 Malignant neoplasm of cecum: Secondary | ICD-10-CM

## 2011-12-11 DIAGNOSIS — C182 Malignant neoplasm of ascending colon: Secondary | ICD-10-CM

## 2011-12-11 LAB — CBC WITH DIFFERENTIAL/PLATELET
Basophils Absolute: 0 10*3/uL (ref 0.0–0.1)
Eosinophils Absolute: 0.1 10*3/uL (ref 0.0–0.5)
HCT: 35.8 % — ABNORMAL LOW (ref 38.4–49.9)
MCH: 32.5 pg (ref 27.2–33.4)
MCHC: 32.8 g/dL (ref 32.0–36.0)
MONO#: 0.7 10*3/uL (ref 0.1–0.9)
RDW: 21.6 % — ABNORMAL HIGH (ref 11.0–14.6)

## 2011-12-11 LAB — COMPREHENSIVE METABOLIC PANEL
Albumin: 3.9 g/dL (ref 3.5–5.2)
BUN: 14 mg/dL (ref 6–23)
CO2: 27 mEq/L (ref 19–32)
Calcium: 9.7 mg/dL (ref 8.4–10.5)
Glucose, Bld: 113 mg/dL — ABNORMAL HIGH (ref 70–99)
Potassium: 3.9 mEq/L (ref 3.5–5.3)
Sodium: 145 mEq/L (ref 135–145)
Total Protein: 6.2 g/dL (ref 6.0–8.3)

## 2011-12-11 LAB — CEA: CEA: 2 ng/mL (ref 0.0–5.0)

## 2011-12-20 ENCOUNTER — Other Ambulatory Visit: Payer: Self-pay | Admitting: *Deleted

## 2011-12-20 DIAGNOSIS — C182 Malignant neoplasm of ascending colon: Secondary | ICD-10-CM

## 2011-12-20 MED ORDER — CAPECITABINE 500 MG PO TABS: ORAL_TABLET | ORAL | Status: DC

## 2011-12-21 ENCOUNTER — Telehealth: Payer: Self-pay | Admitting: *Deleted

## 2011-12-21 NOTE — Telephone Encounter (Signed)
Received confirmation of xeloda prescription shipment on 12/21/11 for delivery next business day.

## 2012-01-08 ENCOUNTER — Telehealth: Payer: Self-pay | Admitting: Oncology

## 2012-01-08 ENCOUNTER — Ambulatory Visit (HOSPITAL_BASED_OUTPATIENT_CLINIC_OR_DEPARTMENT_OTHER): Payer: Medicare Other | Admitting: Nurse Practitioner

## 2012-01-08 ENCOUNTER — Other Ambulatory Visit (HOSPITAL_BASED_OUTPATIENT_CLINIC_OR_DEPARTMENT_OTHER): Payer: Medicare Other | Admitting: Lab

## 2012-01-08 VITALS — BP 127/65 | HR 75 | Temp 97.0°F | Ht 67.0 in | Wt 139.6 lb

## 2012-01-08 DIAGNOSIS — C180 Malignant neoplasm of cecum: Secondary | ICD-10-CM

## 2012-01-08 DIAGNOSIS — C182 Malignant neoplasm of ascending colon: Secondary | ICD-10-CM

## 2012-01-08 LAB — CBC WITH DIFFERENTIAL/PLATELET
Basophils Absolute: 0 10*3/uL (ref 0.0–0.1)
Eosinophils Absolute: 0 10*3/uL (ref 0.0–0.5)
HCT: 36.5 % — ABNORMAL LOW (ref 38.4–49.9)
HGB: 12.1 g/dL — ABNORMAL LOW (ref 13.0–17.1)
MCV: 98.7 fL — ABNORMAL HIGH (ref 79.3–98.0)
MONO%: 7 % (ref 0.0–14.0)
NEUT#: 5.7 10*3/uL (ref 1.5–6.5)
RDW: 23.7 % — ABNORMAL HIGH (ref 11.0–14.6)
lymph#: 0.9 10*3/uL (ref 0.9–3.3)

## 2012-01-08 LAB — COMPREHENSIVE METABOLIC PANEL
AST: 22 U/L (ref 0–37)
Albumin: 4 g/dL (ref 3.5–5.2)
Alkaline Phosphatase: 89 U/L (ref 39–117)
Potassium: 3.6 mEq/L (ref 3.5–5.3)
Sodium: 142 mEq/L (ref 135–145)
Total Bilirubin: 1 mg/dL (ref 0.3–1.2)
Total Protein: 6.7 g/dL (ref 6.0–8.3)

## 2012-01-08 NOTE — Progress Notes (Signed)
OFFICE PROGRESS NOTE  Interval history:  [name] is an 78 year old man with stage IV colon cancer with peritoneal metastases. He is currently being treated with single agent Xeloda currently on a 1 week on/1 week off schedule. Restaging CT evaluation 09/24/2011 showed resolution of peritoneal metastases. He is seen today for scheduled followup.  [name] reports he is tolerating the Xeloda well. He denies nausea/vomiting. No mouth sores. No diarrhea. He notes that his hands and feet are dry. He is applying lotion liberally. He denies hand or foot pain. He has a good energy level. He remains very active.   Objective: Blood pressure 127/65, pulse 75, temperature 97 F (36.1 C), temperature source Oral, height 5\' 7"  (1.702 m), weight 139 lb 9.6 oz (63.322 kg).  Oropharynx is without thrush or ulceration. No palpable cervical, supraclavicular or axillary lymph nodes. Lungs are clear. No wheezes or rales. Regular cardiac rhythm. Abdomen is soft and nontender. No organomegaly. Abdominal hernia which is reducible. Extremities are without edema. Palms with dry skin and mild erythema. Hard of hearing.  Lab Results: Lab Results  Component Value Date   WBC 7.1 01/08/2012   HGB 12.1* 01/08/2012   HCT 36.5* 01/08/2012   MCV 98.7* 01/08/2012   PLT 189 01/08/2012    Chemistry:    Chemistry      Component Value Date/Time   NA 145 12/11/2011 1154   NA 145 12/11/2011 1154   K 3.9 12/11/2011 1154   K 3.9 12/11/2011 1154   CL 108 12/11/2011 1154   CL 108 12/11/2011 1154   CO2 27 12/11/2011 1154   CO2 27 12/11/2011 1154   BUN 14 12/11/2011 1154   BUN 14 12/11/2011 1154   CREATININE 0.78 12/11/2011 1154   CREATININE 0.78 12/11/2011 1154   CREATININE 0.70 03/13/2011 1402      Component Value Date/Time   CALCIUM 9.7 12/11/2011 1154   CALCIUM 9.7 12/11/2011 1154   ALKPHOS 82 12/11/2011 1154   ALKPHOS 82 12/11/2011 1154   AST 23 12/11/2011 1154   AST 23 12/11/2011 1154   ALT 12 12/11/2011 1154   ALT 12 12/11/2011  1154   BILITOT 0.9 12/11/2011 1154   BILITOT 0.9 12/11/2011 1154       Studies/Results: No results found.  Medications: I have reviewed the patient's current medications.  Assessment/Plan: 1. Stage IV colon cancer with peritoneal metastases at diagnosis April 2012. He continues of Xeloda on a 1 week on/1 week off schedule. Restaging CT evaluation 09/24/2011 showed resolution of peritoneal metastases. 2. Hand-foot syndrome secondary to Xeloda-stable. He will continue to apply a lotion. 3. Coronary artery disease. 4. Valvular heart disease. 5. Cerebrovascular disease status post right carotid endarterectomy.. 6. Essential hypertension. 7. Hyperlipidemia. 8. Sensorineural hearing loss.  Disposition-[name]. He continues to do well clinically. He will continue Xeloda on the current schedule. We will obtain restaging CT scans of the abdomen/pelvis the week of 02/11/2012. He will return for a followup visit with Dr. [name] 02/19/2012. He will contact the office the interim with any problems.  Lonna Cobb ANP/GNP-BC

## 2012-01-08 NOTE — Telephone Encounter (Signed)
gv wife appt schedule for march including ct.

## 2012-01-16 ENCOUNTER — Encounter: Payer: Self-pay | Admitting: *Deleted

## 2012-01-16 ENCOUNTER — Other Ambulatory Visit: Payer: Medicare Other | Admitting: Lab

## 2012-01-16 NOTE — Progress Notes (Signed)
[facility] faxed xeloda refill request.  Request to MD for review.

## 2012-01-18 ENCOUNTER — Other Ambulatory Visit: Payer: Self-pay | Admitting: *Deleted

## 2012-01-18 DIAGNOSIS — C182 Malignant neoplasm of ascending colon: Secondary | ICD-10-CM

## 2012-01-18 MED ORDER — CAPECITABINE 500 MG PO TABS: ORAL_TABLET | ORAL | Status: DC

## 2012-01-18 NOTE — Telephone Encounter (Signed)
THIS REQUEST WAS PLACED IN DR.GRANFORTUNA'S ACTIVE WORK BOX. 

## 2012-01-18 NOTE — Telephone Encounter (Signed)
RECEIVED CONFIRMATION OF FACSIMILE RECEIPT.

## 2012-01-29 ENCOUNTER — Other Ambulatory Visit (HOSPITAL_BASED_OUTPATIENT_CLINIC_OR_DEPARTMENT_OTHER): Payer: Medicare Other | Admitting: Lab

## 2012-01-29 DIAGNOSIS — C182 Malignant neoplasm of ascending colon: Secondary | ICD-10-CM

## 2012-01-29 LAB — CBC WITH DIFFERENTIAL/PLATELET
BASO%: 0.1 % (ref 0.0–2.0)
Basophils Absolute: 0 10*3/uL (ref 0.0–0.1)
HCT: 37.4 % — ABNORMAL LOW (ref 38.4–49.9)
HGB: 12.2 g/dL — ABNORMAL LOW (ref 13.0–17.1)
LYMPH%: 18.5 % (ref 14.0–49.0)
MCH: 32.3 pg (ref 27.2–33.4)
MCHC: 32.5 g/dL (ref 32.0–36.0)
MONO#: 0.8 10*3/uL (ref 0.1–0.9)
NEUT%: 69.7 % (ref 39.0–75.0)
Platelets: 186 10*3/uL (ref 140–400)

## 2012-02-06 ENCOUNTER — Encounter: Payer: Self-pay | Admitting: *Deleted

## 2012-02-06 ENCOUNTER — Other Ambulatory Visit: Payer: Self-pay | Admitting: *Deleted

## 2012-02-06 DIAGNOSIS — C182 Malignant neoplasm of ascending colon: Secondary | ICD-10-CM

## 2012-02-06 MED ORDER — CAPECITABINE 500 MG PO TABS: ORAL_TABLET | ORAL | Status: DC

## 2012-02-06 NOTE — Progress Notes (Signed)
[facility] faxed Xeloda refill request.  Request to MD for review.

## 2012-02-11 ENCOUNTER — Other Ambulatory Visit: Payer: Self-pay | Admitting: Oncology

## 2012-02-13 ENCOUNTER — Telehealth: Payer: Self-pay | Admitting: Certified Registered Nurse Anesthetist

## 2012-02-13 ENCOUNTER — Other Ambulatory Visit (HOSPITAL_BASED_OUTPATIENT_CLINIC_OR_DEPARTMENT_OTHER): Payer: Medicare Other | Admitting: Lab

## 2012-02-13 ENCOUNTER — Ambulatory Visit (HOSPITAL_COMMUNITY)
Admission: RE | Admit: 2012-02-13 | Discharge: 2012-02-13 | Disposition: A | Discharge status: Home / Self Care | Payer: Medicare Other | Source: Ambulatory Visit | Attending: Nurse Practitioner | Admitting: Nurse Practitioner

## 2012-02-13 DIAGNOSIS — K862 Cyst of pancreas: Secondary | ICD-10-CM | POA: Insufficient documentation

## 2012-02-13 DIAGNOSIS — Z9049 Acquired absence of other specified parts of digestive tract: Secondary | ICD-10-CM | POA: Insufficient documentation

## 2012-02-13 DIAGNOSIS — I700 Atherosclerosis of aorta: Secondary | ICD-10-CM | POA: Insufficient documentation

## 2012-02-13 DIAGNOSIS — C182 Malignant neoplasm of ascending colon: Secondary | ICD-10-CM

## 2012-02-13 DIAGNOSIS — I708 Atherosclerosis of other arteries: Secondary | ICD-10-CM | POA: Insufficient documentation

## 2012-02-13 DIAGNOSIS — J9819 Other pulmonary collapse: Secondary | ICD-10-CM | POA: Insufficient documentation

## 2012-02-13 DIAGNOSIS — R599 Enlarged lymph nodes, unspecified: Secondary | ICD-10-CM | POA: Insufficient documentation

## 2012-02-13 DIAGNOSIS — I701 Atherosclerosis of renal artery: Secondary | ICD-10-CM | POA: Insufficient documentation

## 2012-02-13 DIAGNOSIS — Z79899 Other long term (current) drug therapy: Secondary | ICD-10-CM | POA: Insufficient documentation

## 2012-02-13 DIAGNOSIS — M4120 Other idiopathic scoliosis, site unspecified: Secondary | ICD-10-CM | POA: Insufficient documentation

## 2012-02-13 DIAGNOSIS — K863 Pseudocyst of pancreas: Secondary | ICD-10-CM | POA: Insufficient documentation

## 2012-02-13 DIAGNOSIS — K869 Disease of pancreas, unspecified: Secondary | ICD-10-CM | POA: Insufficient documentation

## 2012-02-13 DIAGNOSIS — C189 Malignant neoplasm of colon, unspecified: Secondary | ICD-10-CM | POA: Insufficient documentation

## 2012-02-13 DIAGNOSIS — I517 Cardiomegaly: Secondary | ICD-10-CM | POA: Insufficient documentation

## 2012-02-13 DIAGNOSIS — J90 Pleural effusion, not elsewhere classified: Secondary | ICD-10-CM | POA: Insufficient documentation

## 2012-02-13 DIAGNOSIS — N289 Disorder of kidney and ureter, unspecified: Secondary | ICD-10-CM | POA: Insufficient documentation

## 2012-02-13 DIAGNOSIS — I77819 Aortic ectasia, unspecified site: Secondary | ICD-10-CM | POA: Insufficient documentation

## 2012-02-13 DIAGNOSIS — K551 Chronic vascular disorders of intestine: Secondary | ICD-10-CM | POA: Insufficient documentation

## 2012-02-13 DIAGNOSIS — I2510 Atherosclerotic heart disease of native coronary artery without angina pectoris: Secondary | ICD-10-CM | POA: Insufficient documentation

## 2012-02-13 DIAGNOSIS — R188 Other ascites: Secondary | ICD-10-CM | POA: Insufficient documentation

## 2012-02-13 DIAGNOSIS — I70209 Unspecified atherosclerosis of native arteries of extremities, unspecified extremity: Secondary | ICD-10-CM | POA: Insufficient documentation

## 2012-02-13 DIAGNOSIS — M6200 Separation of muscle (nontraumatic), unspecified site: Secondary | ICD-10-CM | POA: Insufficient documentation

## 2012-02-13 LAB — CBC WITH DIFFERENTIAL/PLATELET
BASO%: 0.1 % (ref 0.0–2.0)
Basophils Absolute: 0 10*3/uL (ref 0.0–0.1)
Eosinophils Absolute: 0.1 10*3/uL (ref 0.0–0.5)
HCT: 36.8 % — ABNORMAL LOW (ref 38.4–49.9)
HGB: 12 g/dL — ABNORMAL LOW (ref 13.0–17.1)
MCHC: 32.5 g/dL (ref 32.0–36.0)
MONO#: 0.4 10*3/uL (ref 0.1–0.9)
NEUT#: 5.2 10*3/uL (ref 1.5–6.5)
NEUT%: 77.6 % — ABNORMAL HIGH (ref 39.0–75.0)
Platelets: 194 10*3/uL (ref 140–400)
WBC: 6.7 10*3/uL (ref 4.0–10.3)
lymph#: 1 10*3/uL (ref 0.9–3.3)

## 2012-02-13 LAB — COMPREHENSIVE METABOLIC PANEL WITH GFR
ALT: 13 U/L (ref 0–53)
AST: 22 U/L (ref 0–37)
Albumin: 3.9 g/dL (ref 3.5–5.2)
Alkaline Phosphatase: 95 U/L (ref 39–117)
BUN: 15 mg/dL (ref 6–23)
CO2: 30 meq/L (ref 19–32)
Calcium: 9.9 mg/dL (ref 8.4–10.5)
Chloride: 103 meq/L (ref 96–112)
Creatinine, Ser: 0.89 mg/dL (ref 0.50–1.35)
Glucose, Bld: 116 mg/dL — ABNORMAL HIGH (ref 70–99)
Potassium: 3.6 meq/L (ref 3.5–5.3)
Sodium: 140 meq/L (ref 135–145)
Total Bilirubin: 1.1 mg/dL (ref 0.3–1.2)
Total Protein: 7 g/dL (ref 6.0–8.3)

## 2012-02-13 MED ORDER — IOHEXOL 300 MG/ML  SOLN
100.0000 mL | Freq: Once | INTRAMUSCULAR | Status: AC | PRN
  Administered 2012-02-13: 100 mL via INTRAVENOUS

## 2012-02-13 NOTE — Telephone Encounter (Signed)
Confirmation of Prescription Shipment fax received 02/12/2012. Xeloda.

## 2012-02-19 ENCOUNTER — Telehealth: Payer: Self-pay | Admitting: Oncology

## 2012-02-19 ENCOUNTER — Ambulatory Visit (HOSPITAL_BASED_OUTPATIENT_CLINIC_OR_DEPARTMENT_OTHER): Payer: Medicare Other | Admitting: Oncology

## 2012-02-19 VITALS — BP 132/59 | HR 91 | Temp 96.7°F | Ht 67.0 in | Wt 138.4 lb

## 2012-02-19 DIAGNOSIS — I739 Peripheral vascular disease, unspecified: Secondary | ICD-10-CM

## 2012-02-19 DIAGNOSIS — C189 Malignant neoplasm of colon, unspecified: Secondary | ICD-10-CM

## 2012-02-19 DIAGNOSIS — C786 Secondary malignant neoplasm of retroperitoneum and peritoneum: Secondary | ICD-10-CM

## 2012-02-19 DIAGNOSIS — I2510 Atherosclerotic heart disease of native coronary artery without angina pectoris: Secondary | ICD-10-CM

## 2012-02-19 NOTE — Progress Notes (Signed)
Hematology and Oncology Follow Up Visit  Charles Faulkner [id number] December 25, 1925 78 y.o. 02/19/2012 11:23 AM   Principle Diagnosis: Encounter Diagnosis  Name Primary?  . Colon cancer metastasized to multiple sites Yes     Interim History:   Followup visit for this 78 year old man who given his age, underlying cardiac, valvular, and peripheral vascular disease, and the fact that he was found to have peritoneal carcinomatosis at time of exploratory surgery for colon cancer a year ago in April,  is doing amazingly well. He has been on treatment with single agent Xeloda chemotherapy. He has achieved a complete radiographic response which has been durable including most recent scan done on 02/13/2012 which I personally reviewed. He is currently receiving 1500 mg in the morning 1000 mg in the afternoon one week on 1 week off schedule with no side effects. Occasional loose bowel movement. No diarrhea. No mucositis. No hand-foot syndrome. Appetite is good. Weight is stable. He has had no other interim medical problems.   Medications: reviewed  Allergies:  Allergies  Allergen Reactions  . Metoprolol Tartrate     REACTION: hives    Review of Systems: Constitutional:  No constitutional symptoms  Respiratory: No cough or dyspnea Cardiovascular:  No chest pain or palpitations Gastrointestinal: No abdominal pain no change in bowel habit Genito-Urinary: No urinary tract symptoms Musculoskeletal: No bone pain Neurologic: No headache or change in vision Skin: No rash Remaining ROS negative.  Physical Exam: Blood pressure 132/59, pulse 91, temperature 96.7 F (35.9 C), temperature source Oral, height 5\' 7"  (1.702 m), weight 138 lb 6.4 oz (62.778 kg). Wt Readings from Last 3 Encounters:  02/19/12 138 lb 6.4 oz (62.778 kg)  01/08/12 139 lb 9.6 oz (63.322 kg)  11/13/11 140 lb 9.6 oz (63.776 kg)     General appearance: A thin Caucasian man who is hard of hearing HENNT: Pharynx no erythema  exudate or ulcer Lymph nodes: No adenopathy Breasts: Lungs: Clear to auscultation resonant to percussion Heart: Regular rhythm no murmur Abdomen: Soft nontender no mass no organomegaly Extremities: No edema no calf tenderness Vascular: No cyanosis Neurologic: Hard of hearing. No other focal deficits. Motor strength 5 over 5. Reflexes 1+ symmetric. Skin: No rash  Lab Results: Lab Results  Component Value Date   WBC 6.7 02/13/2012   HGB 12.0* 02/13/2012   HCT 36.8* 02/13/2012   MCV 99.9* 02/13/2012   PLT 194 02/13/2012     Chemistry      Component Value Date/Time   NA 140 02/13/2012 0732   K 3.6 02/13/2012 0732   CL 103 02/13/2012 0732   CO2 30 02/13/2012 0732   BUN 15 02/13/2012 0732   CREATININE 0.89 02/13/2012 0732   CREATININE 0.70 03/13/2011 1402      Component Value Date/Time   CALCIUM 9.9 02/13/2012 0732   ALKPHOS 95 02/13/2012 0732   AST 22 02/13/2012 0732   ALT 13 02/13/2012 0732   BILITOT 1.1 02/13/2012 0732       Radiological Studies: See discussion above. CT scan abdomen and pelvis done 02/13/12 with resolution of previous rectal wall thickening and complete regression of peritoneal nodules. No new disease   Impression and Plan: #1. Cancer of the cecum metastatic to peritoneum complete radiographic response to oral Xeloda. Plan continue the same.  #2. Coronary artery disease status post bypass surgery status post coronary stenting.  #3. Mitral and aortic valvular disease.  #4. Cerebrovascular disease status post right carotid endarterectomy.  #5. Peripheral vascular disease.  #6.  Bilateral pleural effusions left greater than right. Likely Cardiac origin stable on current CT scan compared with previous study.Currently asymptomatic.  #7. Sensorineural hearing deficits    CC:. Dr. Artis Delay; Dr. Franky Macho; Dr. Almond Lint; Dr. Nanetta Batty   Levert Feinstein, MD 3/26/201311:23 AM

## 2012-02-19 NOTE — Telephone Encounter (Signed)
Gv pt appt for april-may2013 

## 2012-03-05 ENCOUNTER — Other Ambulatory Visit: Payer: Self-pay | Admitting: *Deleted

## 2012-03-05 NOTE — Telephone Encounter (Signed)
Refill request to MD desk 

## 2012-03-07 ENCOUNTER — Other Ambulatory Visit: Payer: Self-pay | Admitting: *Deleted

## 2012-03-07 DIAGNOSIS — C182 Malignant neoplasm of ascending colon: Secondary | ICD-10-CM

## 2012-03-07 MED ORDER — CAPECITABINE 500 MG PO TABS: ORAL_TABLET | ORAL | Status: DC

## 2012-03-13 ENCOUNTER — Encounter: Payer: Self-pay | Admitting: *Deleted

## 2012-03-13 NOTE — Progress Notes (Signed)
ON 03/12/12 RECEIVED A FAX FROM [facility] CONCERNING A CONFIRMATION OF PRESCRIPTION SHIPMENT FOR XELODA. 

## 2012-03-17 ENCOUNTER — Other Ambulatory Visit (HOSPITAL_BASED_OUTPATIENT_CLINIC_OR_DEPARTMENT_OTHER): Payer: Medicare Other | Admitting: Lab

## 2012-03-17 DIAGNOSIS — C189 Malignant neoplasm of colon, unspecified: Secondary | ICD-10-CM

## 2012-03-17 DIAGNOSIS — C180 Malignant neoplasm of cecum: Secondary | ICD-10-CM

## 2012-03-17 LAB — COMPREHENSIVE METABOLIC PANEL
BUN: 13 mg/dL (ref 6–23)
CO2: 29 mEq/L (ref 19–32)
Calcium: 10 mg/dL (ref 8.4–10.5)
Chloride: 107 mEq/L (ref 96–112)
Creatinine, Ser: 0.79 mg/dL (ref 0.50–1.35)
Glucose, Bld: 124 mg/dL — ABNORMAL HIGH (ref 70–99)

## 2012-03-17 LAB — CBC WITH DIFFERENTIAL/PLATELET
Eosinophils Absolute: 0.1 10*3/uL (ref 0.0–0.5)
HCT: 36.3 % — ABNORMAL LOW (ref 38.4–49.9)
LYMPH%: 11.7 % — ABNORMAL LOW (ref 14.0–49.0)
MCV: 101.2 fL — ABNORMAL HIGH (ref 79.3–98.0)
MONO#: 0.8 10*3/uL (ref 0.1–0.9)
MONO%: 9 % (ref 0.0–14.0)
NEUT#: 6.5 10*3/uL (ref 1.5–6.5)
NEUT%: 77.9 % — ABNORMAL HIGH (ref 39.0–75.0)
Platelets: 173 10*3/uL (ref 140–400)
WBC: 8.4 10*3/uL (ref 4.0–10.3)

## 2012-03-31 ENCOUNTER — Ambulatory Visit (INDEPENDENT_AMBULATORY_CARE_PROVIDER_SITE_OTHER): Payer: Medicare Other | Admitting: General Surgery

## 2012-03-31 ENCOUNTER — Encounter (INDEPENDENT_AMBULATORY_CARE_PROVIDER_SITE_OTHER): Payer: Self-pay | Admitting: General Surgery

## 2012-03-31 VITALS — BP 140/70 | HR 82 | Temp 98.0°F | Ht 64.5 in | Wt 141.0 lb

## 2012-03-31 DIAGNOSIS — C182 Malignant neoplasm of ascending colon: Secondary | ICD-10-CM

## 2012-03-31 DIAGNOSIS — K432 Incisional hernia without obstruction or gangrene: Secondary | ICD-10-CM

## 2012-03-31 NOTE — Assessment & Plan Note (Signed)
Given stage IV disease, will hold off on 1 year colonoscopy.  Unlikely to change treatment at this point.  Pt is very poor surgical candidate given difficulty with original surgery and presence of peritoneal metastases.    Pt to continue Xeloda 7 days on, 7 days off.    Discussed with Dr. [name].    He will be in charge of ordering scans/labs as indicated.

## 2012-03-31 NOTE — Progress Notes (Signed)
HISTORY: Patient is now approximately 13 months status post right hemicolectomy for stage for obstructing colon cancer. He has done quite well. He was overall very sick and has many medical problems. He is currently on Xeloda 7 days on, 7 days off.  He denies abdominal pain. He denies blood in stool. He denies nausea, vomiting, weight loss. He has actually gained 2 more pounds. He is tolerating Xeloda well. His last scan demonstrated good response of the peritoneal enhancement.   PERTINENT REVIEW OF SYSTEMS: Otherwise negative.     EXAM: Head: Normocephalic and atraumatic.  Eyes:  Conjunctivae are normal. Pupils are equal, round, and reactive to light. No scleral icterus.  Neck:  Normal range of motion. Neck supple. No tracheal deviation present. No thyromegaly present.  Resp: No respiratory distress, normal effort. Abd:  Abdomen is soft, non distended and non tender. No masses are palpable.  There is no rebound and no guarding.  Neurological: Alert and oriented to person, place, and time. Coordination impaired.  Uses walker.    Skin: Skin is warm and dry. No rash noted. No diaphoretic. No erythema. No pallor.  Psychiatric: Normal mood and affect. Normal behavior. Judgment and thought content normal.      ASSESSMENT AND PLAN:   Stage IV cancer of cecum T4b N2b M1, peritoneal mets Given stage IV disease, will hold off on 1 year colonoscopy.  Unlikely to change treatment at this point.  Pt is very poor surgical candidate given difficulty with original surgery and presence of peritoneal metastases.    Pt to continue Xeloda 7 days on, 7 days off.    Discussed with Dr. [name].    He will be in charge of ordering scans/labs as indicated.    Incisional hernia Abdominal binder as needed if discomfort develops.  Currently asymptomatic.       Maudry Diego, MD Surgical Oncology, General & Endocrine Surgery [facility]  [name]., MD, MD [name]., MD

## 2012-03-31 NOTE — Assessment & Plan Note (Signed)
Abdominal binder as needed if discomfort develops.  Currently asymptomatic.

## 2012-03-31 NOTE — Patient Instructions (Signed)
Dr. [name] says OK to hold off on colonoscopy unless symptoms occur.

## 2012-04-11 ENCOUNTER — Telehealth: Payer: Self-pay | Admitting: *Deleted

## 2012-04-11 ENCOUNTER — Other Ambulatory Visit: Payer: Self-pay

## 2012-04-11 DIAGNOSIS — C182 Malignant neoplasm of ascending colon: Secondary | ICD-10-CM

## 2012-04-11 MED ORDER — CAPECITABINE 500 MG PO TABS: ORAL_TABLET | ORAL | Status: DC

## 2012-04-11 NOTE — Telephone Encounter (Signed)
Received confirmation of fax receipt from [facility] stating referral received & will be in touch once insurance verified & delivery arrangements made.

## 2012-04-15 ENCOUNTER — Other Ambulatory Visit (HOSPITAL_BASED_OUTPATIENT_CLINIC_OR_DEPARTMENT_OTHER): Payer: Medicare Other | Admitting: Lab

## 2012-04-15 ENCOUNTER — Ambulatory Visit (HOSPITAL_BASED_OUTPATIENT_CLINIC_OR_DEPARTMENT_OTHER): Payer: Medicare Other | Admitting: Nurse Practitioner

## 2012-04-15 ENCOUNTER — Telehealth: Payer: Self-pay | Admitting: Oncology

## 2012-04-15 VITALS — BP 117/61 | HR 83 | Temp 97.0°F | Ht 64.5 in | Wt 137.6 lb

## 2012-04-15 DIAGNOSIS — C182 Malignant neoplasm of ascending colon: Secondary | ICD-10-CM

## 2012-04-15 DIAGNOSIS — T451X5A Adverse effect of antineoplastic and immunosuppressive drugs, initial encounter: Secondary | ICD-10-CM

## 2012-04-15 DIAGNOSIS — C189 Malignant neoplasm of colon, unspecified: Secondary | ICD-10-CM

## 2012-04-15 DIAGNOSIS — C786 Secondary malignant neoplasm of retroperitoneum and peritoneum: Secondary | ICD-10-CM

## 2012-04-15 DIAGNOSIS — L270 Generalized skin eruption due to drugs and medicaments taken internally: Secondary | ICD-10-CM

## 2012-04-15 LAB — CBC WITH DIFFERENTIAL/PLATELET
BASO%: 0.3 % (ref 0.0–2.0)
EOS%: 0.8 % (ref 0.0–7.0)
HCT: 35.9 % — ABNORMAL LOW (ref 38.4–49.9)
LYMPH%: 11.5 % — ABNORMAL LOW (ref 14.0–49.0)
MCH: 33.7 pg — ABNORMAL HIGH (ref 27.2–33.4)
MCHC: 33.2 g/dL (ref 32.0–36.0)
NEUT%: 75.7 % — ABNORMAL HIGH (ref 39.0–75.0)
lymph#: 0.6 10*3/uL — ABNORMAL LOW (ref 0.9–3.3)

## 2012-04-15 LAB — CEA: CEA: 2.6 ng/mL (ref 0.0–5.0)

## 2012-04-15 LAB — COMPREHENSIVE METABOLIC PANEL
ALT: 13 U/L (ref 0–53)
Albumin: 3.6 g/dL (ref 3.5–5.2)
CO2: 26 mEq/L (ref 19–32)
Chloride: 105 mEq/L (ref 96–112)
Potassium: 3.9 mEq/L (ref 3.5–5.3)
Sodium: 138 mEq/L (ref 135–145)
Total Bilirubin: 1.1 mg/dL (ref 0.3–1.2)
Total Protein: 6.1 g/dL (ref 6.0–8.3)

## 2012-04-15 NOTE — Progress Notes (Signed)
OFFICE PROGRESS NOTE  Interval history:  Charles Faulkner returns as scheduled. He continues of Xeloda 1500 mg every morning and 1000 mg every afternoon 1 week on/1 week off. Most recent restaging CT scans done 02/13/2012 showed a continued complete radiographic response.  He feels well. No nausea or vomiting. No mouth sores. Hands are dry. He denies hand or foot pain. He is applying lotion liberally. He has intermittent loose stools. Overall good appetite. He remains very active.   Objective: Blood pressure 117/61, pulse 83, temperature 97 F (36.1 C), temperature source Oral, height 5' 4.5" (1.638 m), weight 137 lb 9.6 oz (62.415 kg).  Oropharynx is without thrush or ulceration. No palpable cervical, supraclavicular or axillary lymph nodes. Lungs are clear. Regular cardiac rhythm. Abdomen is soft and nontender. No organomegaly. Reducible abdominal hernia. Trace lower leg edema bilaterally. Palms with dry skin and mild erythema. He is hard of hearing.  Lab Results: Lab Results  Component Value Date   WBC 5.4 04/15/2012   HGB 11.9* 04/15/2012   HCT 35.9* 04/15/2012   MCV 101.3* 04/15/2012   PLT 179 04/15/2012    Chemistry:    Chemistry      Component Value Date/Time   NA 143 03/17/2012 0837   K 4.0 03/17/2012 0837   CL 107 03/17/2012 0837   CO2 29 03/17/2012 0837   BUN 13 03/17/2012 0837   CREATININE 0.79 03/17/2012 0837   CREATININE 0.70 03/13/2011 1402      Component Value Date/Time   CALCIUM 10.0 03/17/2012 0837   ALKPHOS 93 03/17/2012 0837   AST 23 03/17/2012 0837   ALT 14 03/17/2012 0837   BILITOT 1.1 03/17/2012 0837       Studies/Results: No results found.  Medications: I have reviewed the patient's current medications.  Assessment/Plan:  1. Stage IV colon cancer with peritoneal metastases at diagnosis April 2012. He continues of Xeloda on a 1 week on/1 week off schedule. Restaging CT evaluation 02/13/2012 showed continued complete radiographic response. 2. Hand-foot syndrome  secondary to Xeloda-stable. He will continue to apply lotion. 3. Coronary artery disease status post bypass surgery, status post coronary stenting. 4. Valvular heart disease. 5. Cerebrovascular disease status post right carotid endarterectomy.. 6. Essential hypertension. 7. Hyperlipidemia. 8. Sensorineural hearing loss. 9. Bilateral pleural effusions, stable on CT scan 02/13/2012. Likely cardiac origin. He remains asymptomatic.  Disposition-Mr. Weekly appears well. He will continue Xeloda at the current dose/schedule. We will schedule the next restaging CT evaluation at a four-month interval. He will return for a followup visit in July. He will contact the office in the interim with any problems.  Lonna Cobb ANP/GNP-BC

## 2012-04-15 NOTE — Progress Notes (Signed)
Received fax confirmation from [facility] that pt's Xeloda has shipped for delivery on 5/21. dph

## 2012-04-15 NOTE — Telephone Encounter (Signed)
appts made and printed for pt aom °

## 2012-05-08 ENCOUNTER — Other Ambulatory Visit: Payer: Self-pay | Admitting: *Deleted

## 2012-05-08 ENCOUNTER — Other Ambulatory Visit: Payer: Self-pay | Admitting: Emergency Medicine

## 2012-05-08 DIAGNOSIS — C182 Malignant neoplasm of ascending colon: Secondary | ICD-10-CM

## 2012-05-08 MED ORDER — CAPECITABINE 500 MG PO TABS: ORAL_TABLET | ORAL | Status: DC

## 2012-05-08 NOTE — Telephone Encounter (Signed)
Hard copy given to Dr. [name]

## 2012-05-13 ENCOUNTER — Encounter: Payer: Self-pay | Admitting: *Deleted

## 2012-05-13 ENCOUNTER — Other Ambulatory Visit (HOSPITAL_BASED_OUTPATIENT_CLINIC_OR_DEPARTMENT_OTHER): Payer: Medicare Other | Admitting: Lab

## 2012-05-13 DIAGNOSIS — C182 Malignant neoplasm of ascending colon: Secondary | ICD-10-CM

## 2012-05-13 DIAGNOSIS — C189 Malignant neoplasm of colon, unspecified: Secondary | ICD-10-CM

## 2012-05-13 LAB — COMPREHENSIVE METABOLIC PANEL
AST: 24 U/L (ref 0–37)
Albumin: 3.5 g/dL (ref 3.5–5.2)
BUN: 15 mg/dL (ref 6–23)
CO2: 27 mEq/L (ref 19–32)
Calcium: 9.6 mg/dL (ref 8.4–10.5)
Chloride: 109 mEq/L (ref 96–112)
Glucose, Bld: 105 mg/dL — ABNORMAL HIGH (ref 70–99)
Potassium: 3.6 mEq/L (ref 3.5–5.3)

## 2012-05-13 LAB — CBC WITH DIFFERENTIAL/PLATELET
Basophils Absolute: 0 10*3/uL (ref 0.0–0.1)
Eosinophils Absolute: 0.1 10*3/uL (ref 0.0–0.5)
HGB: 11.4 g/dL — ABNORMAL LOW (ref 13.0–17.1)
MONO#: 0.8 10*3/uL (ref 0.1–0.9)
NEUT#: 4.4 10*3/uL (ref 1.5–6.5)
RDW: 23.7 % — ABNORMAL HIGH (ref 11.0–14.6)
lymph#: 0.9 10*3/uL (ref 0.9–3.3)

## 2012-05-13 NOTE — Progress Notes (Signed)
RECEIVED A FAX FROM [facility] CONCERNING A CONFIRMATION OF PRESCRIPTION SHIPMENT FOR XELODA. 

## 2012-06-05 ENCOUNTER — Other Ambulatory Visit: Payer: Self-pay | Admitting: *Deleted

## 2012-06-05 ENCOUNTER — Other Ambulatory Visit (HOSPITAL_BASED_OUTPATIENT_CLINIC_OR_DEPARTMENT_OTHER): Payer: Medicare Other | Admitting: Lab

## 2012-06-05 ENCOUNTER — Ambulatory Visit (HOSPITAL_COMMUNITY)
Admission: RE | Admit: 2012-06-05 | Discharge: 2012-06-05 | Disposition: A | Discharge status: Home / Self Care | Payer: Medicare Other | Source: Ambulatory Visit | Attending: Nurse Practitioner | Admitting: Nurse Practitioner

## 2012-06-05 DIAGNOSIS — C182 Malignant neoplasm of ascending colon: Secondary | ICD-10-CM

## 2012-06-05 DIAGNOSIS — C189 Malignant neoplasm of colon, unspecified: Secondary | ICD-10-CM | POA: Insufficient documentation

## 2012-06-05 DIAGNOSIS — Q619 Cystic kidney disease, unspecified: Secondary | ICD-10-CM | POA: Insufficient documentation

## 2012-06-05 DIAGNOSIS — J90 Pleural effusion, not elsewhere classified: Secondary | ICD-10-CM | POA: Insufficient documentation

## 2012-06-05 DIAGNOSIS — M24459 Recurrent dislocation, unspecified hip: Secondary | ICD-10-CM | POA: Insufficient documentation

## 2012-06-05 LAB — CBC WITH DIFFERENTIAL/PLATELET
Basophils Absolute: 0 10*3/uL (ref 0.0–0.1)
EOS%: 1.8 % (ref 0.0–7.0)
HGB: 11.9 g/dL — ABNORMAL LOW (ref 13.0–17.1)
MCH: 35 pg — ABNORMAL HIGH (ref 27.2–33.4)
NEUT#: 4.9 10*3/uL (ref 1.5–6.5)
RDW: 22 % — ABNORMAL HIGH (ref 11.0–14.6)
lymph#: 1.3 10*3/uL (ref 0.9–3.3)

## 2012-06-05 LAB — LACTATE DEHYDROGENASE: LDH: 173 U/L (ref 94–250)

## 2012-06-05 LAB — CEA: CEA: 2.6 ng/mL (ref 0.0–5.0)

## 2012-06-05 LAB — CMP (CANCER CENTER ONLY)
ALT(SGPT): 25 U/L (ref 10–47)
CO2: 29 mEq/L (ref 18–33)
Creat: 0.8 mg/dl (ref 0.6–1.2)
Total Bilirubin: 1.3 mg/dl (ref 0.20–1.60)

## 2012-06-05 MED ORDER — IOHEXOL 300 MG/ML  SOLN
100.0000 mL | Freq: Once | INTRAMUSCULAR | Status: AC | PRN
  Administered 2012-06-05: 100 mL via INTRAVENOUS

## 2012-06-05 NOTE — Telephone Encounter (Signed)
THIS REFILL REQUEST FOR XELODA WAS PLACED IN DR.GRANFORTUNA'S ACTIVE WORK BOX. 

## 2012-06-06 MED ORDER — CAPECITABINE 500 MG PO TABS: ORAL_TABLET | ORAL | Status: DC

## 2012-06-06 NOTE — Telephone Encounter (Signed)
RECEIVED A FAX FROM [facility] CONCERNING A CONFIRMATION OF FACSIMILE RECEIPT OF PT. 'S REFERRAL. 

## 2012-06-06 NOTE — Addendum Note (Signed)
Addended by: Arvilla Meres on: 06/06/2012 10:47 AM   Modules accepted: Orders

## 2012-06-09 NOTE — Telephone Encounter (Signed)
RECEIVED A FAX FROM [facility] CONCERNING A CONFIRMATION OF PRESCRIPTION SHIPMENT FOR XELODA. 

## 2012-06-10 ENCOUNTER — Telehealth: Payer: Self-pay | Admitting: Oncology

## 2012-06-10 ENCOUNTER — Ambulatory Visit (HOSPITAL_BASED_OUTPATIENT_CLINIC_OR_DEPARTMENT_OTHER): Payer: Medicare Other | Admitting: Oncology

## 2012-06-10 VITALS — BP 125/60 | HR 90 | Temp 96.9°F | Ht 64.5 in | Wt 138.5 lb

## 2012-06-10 DIAGNOSIS — R091 Pleurisy: Secondary | ICD-10-CM

## 2012-06-10 DIAGNOSIS — I25810 Atherosclerosis of coronary artery bypass graft(s) without angina pectoris: Secondary | ICD-10-CM

## 2012-06-10 DIAGNOSIS — C786 Secondary malignant neoplasm of retroperitoneum and peritoneum: Secondary | ICD-10-CM

## 2012-06-10 DIAGNOSIS — C182 Malignant neoplasm of ascending colon: Secondary | ICD-10-CM

## 2012-06-10 NOTE — Telephone Encounter (Signed)
gv relative appt schedule for July thru October.

## 2012-06-10 NOTE — Progress Notes (Signed)
Hematology and Oncology Follow Up Visit  Charles Faulkner [id number] 15-Mar-1926 78 y.o. 06/10/2012 1:35 PM   Principle Diagnosis: Encounter Diagnosis  Name Primary?  . 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 He was diagnosed with stage IV colon cancer with diffuse peritoneal metastases at diagnosis in April 2012. He underwent resection of the primary lesion in the cecum. He has been maintained on single agent oral Xeloda chemotherapy with an excellent and ongoing response. There has been regression of the obvious soft tissue disease on CT scans including the study done in anticipation of today's visit on 06/05/2012 which I personally reviewed. There is persistent left pleural fluid which was first noted on a CT scan done in October 2012 and not seen on a April 2012 study. There is a tiny right pleural effusion. I feel this is most likely related to his cardiac and valvular disease and not his cancer since there has been no progressive enlargement of the effusions over time. Unfortunately his CEA is not an accurate tumor marker in this man since it has been normal from time of diagnosis until now. After adjustments in his Xeloda dose initially, now being given on a 1 week on 1 week off schedule, his hand-foot syndrome has resolved. He is moving his bowels well and has not seen any blood or melena. He is not having any pain. Medications: reviewed  Allergies:  Allergies  Allergen Reactions  . Metoprolol Tartrate     REACTION: hives    Review of Systems: Constitutional:   No constitutional symptoms Respiratory: No cough or dyspnea Cardiovascular:  No chest pain or palpitations Gastrointestinal: See above Genito-Urinary: Not  questioned Musculoskeletal: No bone pain Neurologic: No headache or change in vision Skin: No rash Remaining ROS negative.  Physical Exam: Blood pressure 125/60, pulse 90, temperature 96.9 F (36.1 C), temperature source Oral, height 5' 4.5" (1.638 m), weight 138 lb 8 oz (62.823 kg). Wt Readings from Last 3 Encounters:  06/10/12 138 lb 8 oz (62.823 kg)  04/15/12 137 lb 9.6 oz (62.415 kg)  03/31/12 141 lb (63.957 kg)     General appearance: Thin Caucasian man HENNT: Pharynx no erythema or exudate Lymph nodes: No lymphadenopathy Breasts: Lungs: Overall clear to auscultation and resonant to percussion Heart: Regular rhythm loud 3-4/6 systolic murmur heard at the left sternal border and second right intercostal space Abdomen: Soft, nontender, ventral hernia, no palpable mass or organomegaly. Extremities: No edema no calf tenderness. 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 Vascular: No cyanosis Neurologic: Sensory neural hearing deficit motor strength 5 over 5 Skin: No rash or ecchymosis  Lab Results: Lab Results  Component Value Date   WBC 7.0 06/05/2012   HGB 11.9* 06/05/2012   HCT 35.6* 06/05/2012   MCV 104.9* 06/05/2012   PLT 150 06/05/2012     Chemistry      Component Value Date/Time   NA 143 06/05/2012 0822   NA 144 05/13/2012 0844   K 3.9 06/05/2012 0822   K 3.6 05/13/2012 0844   CL 100 06/05/2012 0822   CL 109 05/13/2012 0844   CO2 29 06/05/2012 0822   CO2 27 05/13/2012 0844   BUN 15 06/05/2012 0822   BUN 15 05/13/2012 0844  CREATININE 0.8 06/05/2012 0822   CREATININE 0.84 05/13/2012 0844      Component Value Date/Time   CALCIUM 9.8 06/05/2012 0822   CALCIUM 9.6 05/13/2012 0844   ALKPHOS 101* 06/05/2012 0822   ALKPHOS 82 05/13/2012 0844   AST 41* 06/05/2012 0822   AST 24 05/13/2012 0844   ALT 13 05/13/2012 0844   BILITOT 1.30 06/05/2012 0822   BILITOT 0.9 05/13/2012 0844       Radiological Studies: Ct Abdomen Pelvis W Contrast  06/05/2012  *RADIOLOGY  REPORT*  Clinical Data: Restaging colon cancer  CT ABDOMEN AND PELVIS WITH CONTRAST  Technique:  Multidetector CT imaging of the abdomen and pelvis was performed following the standard protocol during bolus administration of intravenous contrast.  Contrast: OMNIPAQUE IOHEXOL 300 MG/ML  SOLN  Comparison: CT 02/13/2012  Findings: Left pleural effusions again demonstrated.  There is resolution of the right pleural effusion.  Mild interstitial edema pattern is noted.  No pericardial fluid.  No focal hepatic lesion. Gallbladder, pancreas, spleen, and adrenal glands, and kidneys are unchanged.  There are bilateral nonenhancing renal cysts.  The stomach, small bowel, and colon are unchanged.  A moderate volume of stool throughout the colon.  No evidence of obstruction or mass.  There is heavy intimal calcification of the aorta.  No retroperitoneal lymphadenopathy.  Small amount free fluid in the pelvis which is similar to prior. No pelvic lymphadenopathy.  Small inguinal hernias are noted.  Chronic dislocation of the right femur.  No aggressive osseous lesions.  IMPRESSION:  1.  No evidence of colon cancer recurrence or metastasis. 2.  Persistent left pleural effusion with improved right pleural effusion.  3.  Small amount free fluid the pelvis is unchanged.  4.  Chronic right hip dislocation.  Original Report Authenticated By: Genevive Bi, M.D.    Impression and Plan:  #1. Colon cancer metastatic to peritoneum at diagnosis. He remains clinically and radiographically stable now out 15 months from diagnosis Plan: Continue Xeloda same dose and schedule 1500 mg in the morning 1000 mg in the afternoon  #2. Hand-foot syndrome secondary to [facility]. Resolved He will continue to apply lotion.  #3. Coronary artery disease status post bypass surgery, status post coronary stenting.  #4. Valvular heart disease.  #5. Cerebrovascular disease status post right carotid endarterectomy.. #6. Essential  hypertension.  #7. Hyperlipidemia. Sensorineural hearing loss. #8. Bilateral pleural effusions, stable on CT scan . Likely cardiac origin. He remains asymptomatic.    CC:. Dr. Almond Lint; Dr. Artis Delay; Dr. Franky Macho; Dr. Nanetta Batty   Levert Feinstein, MD 7/16/20131:35 PM

## 2012-06-19 ENCOUNTER — Other Ambulatory Visit (HOSPITAL_BASED_OUTPATIENT_CLINIC_OR_DEPARTMENT_OTHER): Payer: Medicare Other | Admitting: Lab

## 2012-06-19 DIAGNOSIS — C182 Malignant neoplasm of ascending colon: Secondary | ICD-10-CM

## 2012-06-19 DIAGNOSIS — C189 Malignant neoplasm of colon, unspecified: Secondary | ICD-10-CM

## 2012-06-19 LAB — CBC WITH DIFFERENTIAL/PLATELET
BASO%: 0.3 % (ref 0.0–2.0)
LYMPH%: 17.3 % (ref 14.0–49.0)
MCHC: 33.4 g/dL (ref 32.0–36.0)
MCV: 105.1 fL — ABNORMAL HIGH (ref 79.3–98.0)
MONO%: 10.2 % (ref 0.0–14.0)
NEUT%: 69.9 % (ref 39.0–75.0)
Platelets: 140 10*3/uL (ref 140–400)
RBC: 3.22 10*6/uL — ABNORMAL LOW (ref 4.20–5.82)

## 2012-06-19 LAB — COMPREHENSIVE METABOLIC PANEL
ALT: 14 U/L (ref 0–53)
AST: 22 U/L (ref 0–37)
Albumin: 3.6 g/dL (ref 3.5–5.2)
Alkaline Phosphatase: 74 U/L (ref 39–117)
Potassium: 3.6 mEq/L (ref 3.5–5.3)
Sodium: 144 mEq/L (ref 135–145)
Total Protein: 5.9 g/dL — ABNORMAL LOW (ref 6.0–8.3)

## 2012-07-03 ENCOUNTER — Other Ambulatory Visit (HOSPITAL_BASED_OUTPATIENT_CLINIC_OR_DEPARTMENT_OTHER): Payer: Medicare Other | Admitting: Lab

## 2012-07-03 DIAGNOSIS — C182 Malignant neoplasm of ascending colon: Secondary | ICD-10-CM

## 2012-07-03 LAB — COMPREHENSIVE METABOLIC PANEL
ALT: 12 U/L (ref 0–53)
AST: 21 U/L (ref 0–37)
Alkaline Phosphatase: 80 U/L (ref 39–117)
CO2: 30 mEq/L (ref 19–32)
Sodium: 143 mEq/L (ref 135–145)
Total Bilirubin: 1.2 mg/dL (ref 0.3–1.2)
Total Protein: 6 g/dL (ref 6.0–8.3)

## 2012-07-03 LAB — CBC WITH DIFFERENTIAL/PLATELET
BASO%: 0.4 % (ref 0.0–2.0)
EOS%: 1.9 % (ref 0.0–7.0)
LYMPH%: 17.2 % (ref 14.0–49.0)
MCH: 35.2 pg — ABNORMAL HIGH (ref 27.2–33.4)
MCHC: 33.2 g/dL (ref 32.0–36.0)
MONO#: 0.6 10*3/uL (ref 0.1–0.9)
Platelets: 146 10*3/uL (ref 140–400)
RBC: 3.25 10*6/uL — ABNORMAL LOW (ref 4.20–5.82)
WBC: 5.1 10*3/uL (ref 4.0–10.3)

## 2012-07-03 LAB — LACTATE DEHYDROGENASE: LDH: 171 U/L (ref 94–250)

## 2012-07-07 ENCOUNTER — Other Ambulatory Visit: Payer: Self-pay | Admitting: Oncology

## 2012-07-07 ENCOUNTER — Encounter: Payer: Self-pay | Admitting: *Deleted

## 2012-07-07 NOTE — Progress Notes (Signed)
RECEIVED A FAX FROM [facility] CONCERNING A CONFIRMATION OF PRESCRIPTION SHIPMENT FOR XELODA. 

## 2012-07-10 ENCOUNTER — Other Ambulatory Visit: Payer: Self-pay | Admitting: Oncology

## 2012-07-17 ENCOUNTER — Other Ambulatory Visit (HOSPITAL_BASED_OUTPATIENT_CLINIC_OR_DEPARTMENT_OTHER): Payer: Medicare Other | Admitting: Lab

## 2012-07-17 DIAGNOSIS — C182 Malignant neoplasm of ascending colon: Secondary | ICD-10-CM

## 2012-07-17 LAB — COMPREHENSIVE METABOLIC PANEL
ALT: 14 U/L (ref 0–53)
AST: 22 U/L (ref 0–37)
BUN: 14 mg/dL (ref 6–23)
Calcium: 9.6 mg/dL (ref 8.4–10.5)
Creatinine, Ser: 0.82 mg/dL (ref 0.50–1.35)
Total Bilirubin: 1.3 mg/dL — ABNORMAL HIGH (ref 0.3–1.2)

## 2012-07-17 LAB — CBC WITH DIFFERENTIAL/PLATELET
BASO%: 0.2 % (ref 0.0–2.0)
Basophils Absolute: 0 10*3/uL (ref 0.0–0.1)
EOS%: 1.7 % (ref 0.0–7.0)
HCT: 34.3 % — ABNORMAL LOW (ref 38.4–49.9)
HGB: 11.5 g/dL — ABNORMAL LOW (ref 13.0–17.1)
LYMPH%: 16.7 % (ref 14.0–49.0)
MCH: 35.5 pg — ABNORMAL HIGH (ref 27.2–33.4)
MCHC: 33.5 g/dL (ref 32.0–36.0)
MCV: 105.9 fL — ABNORMAL HIGH (ref 79.3–98.0)
MONO%: 7.6 % (ref 0.0–14.0)
NEUT%: 73.8 % (ref 39.0–75.0)
Platelets: 148 10*3/uL (ref 140–400)

## 2012-07-31 ENCOUNTER — Other Ambulatory Visit (HOSPITAL_BASED_OUTPATIENT_CLINIC_OR_DEPARTMENT_OTHER): Payer: Medicare Other

## 2012-07-31 DIAGNOSIS — C182 Malignant neoplasm of ascending colon: Secondary | ICD-10-CM

## 2012-07-31 LAB — CBC WITH DIFFERENTIAL/PLATELET
Basophils Absolute: 0 10*3/uL (ref 0.0–0.1)
EOS%: 2.2 % (ref 0.0–7.0)
LYMPH%: 17.4 % (ref 14.0–49.0)
MCH: 35.8 pg — ABNORMAL HIGH (ref 27.2–33.4)
MCV: 106.7 fL — ABNORMAL HIGH (ref 79.3–98.0)
MONO%: 8.7 % (ref 0.0–14.0)
Platelets: 133 10*3/uL — ABNORMAL LOW (ref 140–400)
RBC: 3.22 10*6/uL — ABNORMAL LOW (ref 4.20–5.82)
RDW: 18.9 % — ABNORMAL HIGH (ref 11.0–14.6)

## 2012-07-31 LAB — COMPREHENSIVE METABOLIC PANEL (CC13)
AST: 25 U/L (ref 5–34)
Albumin: 3.6 g/dL (ref 3.5–5.0)
Alkaline Phosphatase: 101 U/L (ref 40–150)
BUN: 14 mg/dL (ref 7.0–26.0)
Potassium: 3.4 mEq/L — ABNORMAL LOW (ref 3.5–5.1)
Sodium: 144 mEq/L (ref 136–145)
Total Bilirubin: 1.4 mg/dL — ABNORMAL HIGH (ref 0.20–1.20)

## 2012-08-04 ENCOUNTER — Encounter: Payer: Self-pay | Admitting: *Deleted

## 2012-08-04 NOTE — Progress Notes (Signed)
RECEIVED A FAX FROM [facility] CONCERNING A CONFIRMATION OF PRESCRIPTION SHIPMENT FOR XELODA ON 08/01/12.

## 2012-08-14 ENCOUNTER — Other Ambulatory Visit (HOSPITAL_BASED_OUTPATIENT_CLINIC_OR_DEPARTMENT_OTHER): Payer: Medicare Other | Admitting: Lab

## 2012-08-14 DIAGNOSIS — C189 Malignant neoplasm of colon, unspecified: Secondary | ICD-10-CM

## 2012-08-14 DIAGNOSIS — C182 Malignant neoplasm of ascending colon: Secondary | ICD-10-CM

## 2012-08-14 LAB — CBC WITH DIFFERENTIAL/PLATELET
Basophils Absolute: 0 10*3/uL (ref 0.0–0.1)
Eosinophils Absolute: 0.1 10*3/uL (ref 0.0–0.5)
HCT: 37.7 % — ABNORMAL LOW (ref 38.4–49.9)
LYMPH%: 15.2 % (ref 14.0–49.0)
MCV: 107.7 fL — ABNORMAL HIGH (ref 79.3–98.0)
MONO#: 0.6 10*3/uL (ref 0.1–0.9)
MONO%: 8.4 % (ref 0.0–14.0)
NEUT#: 5.1 10*3/uL (ref 1.5–6.5)
NEUT%: 74.5 % (ref 39.0–75.0)
Platelets: 149 10*3/uL (ref 140–400)
RBC: 3.5 10*6/uL — ABNORMAL LOW (ref 4.20–5.82)
WBC: 6.9 10*3/uL (ref 4.0–10.3)

## 2012-08-19 ENCOUNTER — Ambulatory Visit (HOSPITAL_BASED_OUTPATIENT_CLINIC_OR_DEPARTMENT_OTHER): Payer: Medicare Other | Admitting: Nurse Practitioner

## 2012-08-19 ENCOUNTER — Telehealth: Payer: Self-pay | Admitting: Oncology

## 2012-08-19 VITALS — BP 122/54 | HR 68 | Temp 96.8°F | Resp 18 | Ht 64.5 in | Wt 137.9 lb

## 2012-08-19 DIAGNOSIS — C786 Secondary malignant neoplasm of retroperitoneum and peritoneum: Secondary | ICD-10-CM

## 2012-08-19 DIAGNOSIS — C180 Malignant neoplasm of cecum: Secondary | ICD-10-CM

## 2012-08-19 DIAGNOSIS — C182 Malignant neoplasm of ascending colon: Secondary | ICD-10-CM

## 2012-08-19 NOTE — Telephone Encounter (Signed)
appts made and printed for pt aom °

## 2012-08-19 NOTE — Progress Notes (Signed)
OFFICE PROGRESS NOTE  Interval history:  Charles Faulkner is an 78 year old man with stage IV colon cancer with diffuse peritoneal metastases at time of diagnosis in April 2012. He underwent resection of the primary lesion in the cecum. He has since been maintained on single agent oral Xeloda with an ongoing response. Most recent restaging CT evaluation 06/05/2012 showed no evidence of colon cancer recurrence or metastasis.  Charles Faulkner reports that he feels well. He remains very active. He has a good appetite. He notes an alteration in taste the week he is taking Xeloda. He denies mouth sores. No nausea or vomiting. No diarrhea. Hands are dry. No redness or pain. He is applying lotion.   Objective: Blood pressure 122/54, pulse 68, temperature 96.8 F (36 C), temperature source Oral, resp. rate 18, height 5' 4.5" (1.638 m), weight 137 lb 14.4 oz (62.551 kg).  Oropharynx is without thrush or ulceration. No palpable cervical, supraclavicular or axillary lymph nodes. Lungs are clear. No wheezes or rales. Good air movement bilaterally. Regular cardiac rhythm; 2/6 systolic murmur. Abdomen is soft and nontender. Ventral hernia. No organomegaly. Trace lower leg edema bilaterally. Motor strength 5 over 5. Hearing deficit. Palms with a dry appearance. No erythema or skin breakdown.  Lab Results: Lab Results  Component Value Date   WBC 6.9 08/14/2012   HGB 12.6* 08/14/2012   HCT 37.7* 08/14/2012   MCV 107.7* 08/14/2012   PLT 149 08/14/2012    Chemistry:    Chemistry      Component Value Date/Time   NA 144 07/31/2012 0939   NA 140 07/17/2012 0919   NA 143 06/05/2012 0822   K 3.4* 07/31/2012 0939   K 3.7 07/17/2012 0919   K 3.9 06/05/2012 0822   CL 107 07/31/2012 0939   CL 105 07/17/2012 0919   CL 100 06/05/2012 0822   CO2 25 07/31/2012 0939   CO2 27 07/17/2012 0919   CO2 29 06/05/2012 0822   BUN 14.0 07/31/2012 0939   BUN 14 07/17/2012 0919   BUN 15 06/05/2012 0822   CREATININE 0.8 07/31/2012 0939   CREATININE 0.82  07/17/2012 0919   CREATININE 0.8 06/05/2012 0822      Component Value Date/Time   CALCIUM 9.7 07/31/2012 0939   CALCIUM 9.6 07/17/2012 0919   CALCIUM 9.8 06/05/2012 0822   ALKPHOS 101 07/31/2012 0939   ALKPHOS 82 07/17/2012 0919   ALKPHOS 101* 06/05/2012 0822   AST 25 07/31/2012 0939   AST 22 07/17/2012 0919   AST 41* 06/05/2012 0822   ALT 18 07/31/2012 0939   ALT 14 07/17/2012 0919   BILITOT 1.40* 07/31/2012 0939   BILITOT 1.3* 07/17/2012 0919   BILITOT 1.30 06/05/2012 0822       Studies/Results: No results found.  Medications: I have reviewed the patient's current medications.  Assessment/Plan:  1. Stage IV colon cancer with peritoneal metastases at diagnosis April 2012. He continues of Xeloda on a 1 week on/1 week off schedule. Restaging CT evaluation 09/24/2011 showed resolution of peritoneal metastases. Most recent restaging evaluation 06/05/2012 showed an ongoing response. 2. Hand-foot syndrome secondary to Xeloda-stable. He will continue to apply lotion. 3. Coronary artery disease. 4. Valvular heart disease. 5. Cerebrovascular disease status post right carotid endarterectomy.. 6. Essential hypertension. 7. Hyperlipidemia. 8. Sensorineural hearing loss. 9. Bilateral pleural effusions, stable on CT scan 06/05/2012. Likely cardiac origin. He is asymptomatic.  Disposition-Charles Faulkner appears well. He will continue Xeloda on the current dose/schedule. We are checking labs on a 2 week  schedule. We will repeat CT scans at a four-month interval. He will return for a followup visit with Dr. [name] on 10/13/2012. He will contact the office in the interim with any problems.  Plan reviewed with Dr. [name].  Lonna Cobb ANP/GNP-BC

## 2012-08-28 ENCOUNTER — Other Ambulatory Visit (HOSPITAL_BASED_OUTPATIENT_CLINIC_OR_DEPARTMENT_OTHER): Payer: Medicare Other

## 2012-08-28 DIAGNOSIS — C182 Malignant neoplasm of ascending colon: Secondary | ICD-10-CM

## 2012-08-28 LAB — CBC WITH DIFFERENTIAL/PLATELET
Basophils Absolute: 0 10*3/uL (ref 0.0–0.1)
Eosinophils Absolute: 0.2 10*3/uL (ref 0.0–0.5)
HCT: 35.1 % — ABNORMAL LOW (ref 38.4–49.9)
HGB: 11.8 g/dL — ABNORMAL LOW (ref 13.0–17.1)
MCV: 107.4 fL — ABNORMAL HIGH (ref 79.3–98.0)
MONO%: 10.9 % (ref 0.0–14.0)
NEUT#: 4.3 10*3/uL (ref 1.5–6.5)
NEUT%: 68.8 % (ref 39.0–75.0)
RDW: 18.4 % — ABNORMAL HIGH (ref 11.0–14.6)
lymph#: 1.1 10*3/uL (ref 0.9–3.3)

## 2012-09-03 ENCOUNTER — Telehealth: Payer: Self-pay | Admitting: *Deleted

## 2012-09-03 NOTE — Telephone Encounter (Signed)
[facility] faxed confirmation of prescription shipment.  Xeloda shipped 09-02-2012 with next business day delivery. 

## 2012-09-11 ENCOUNTER — Other Ambulatory Visit (HOSPITAL_BASED_OUTPATIENT_CLINIC_OR_DEPARTMENT_OTHER): Payer: Medicare Other

## 2012-09-11 ENCOUNTER — Telehealth: Payer: Self-pay | Admitting: *Deleted

## 2012-09-11 DIAGNOSIS — C182 Malignant neoplasm of ascending colon: Secondary | ICD-10-CM

## 2012-09-11 LAB — CBC WITH DIFFERENTIAL/PLATELET
Basophils Absolute: 0 10*3/uL (ref 0.0–0.1)
Eosinophils Absolute: 0.2 10*3/uL (ref 0.0–0.5)
HGB: 12.2 g/dL — ABNORMAL LOW (ref 13.0–17.1)
LYMPH%: 17.3 % (ref 14.0–49.0)
MCH: 36.3 pg — ABNORMAL HIGH (ref 27.2–33.4)
MCV: 106.7 fL — ABNORMAL HIGH (ref 79.3–98.0)
MONO%: 10.1 % (ref 0.0–14.0)
NEUT#: 4.6 10*3/uL (ref 1.5–6.5)
Platelets: 165 10*3/uL (ref 140–400)

## 2012-09-11 LAB — COMPREHENSIVE METABOLIC PANEL (CC13)
Alkaline Phosphatase: 102 U/L (ref 40–150)
BUN: 16 mg/dL (ref 7.0–26.0)
Creatinine: 0.8 mg/dL (ref 0.7–1.3)
Glucose: 121 mg/dl — ABNORMAL HIGH (ref 70–99)
Total Bilirubin: 1.1 mg/dL (ref 0.20–1.20)

## 2012-09-11 NOTE — Telephone Encounter (Signed)
eror

## 2012-09-25 ENCOUNTER — Other Ambulatory Visit (HOSPITAL_BASED_OUTPATIENT_CLINIC_OR_DEPARTMENT_OTHER): Payer: Medicare Other

## 2012-09-25 DIAGNOSIS — C182 Malignant neoplasm of ascending colon: Secondary | ICD-10-CM

## 2012-09-25 LAB — CBC WITH DIFFERENTIAL/PLATELET
Basophils Absolute: 0 10*3/uL (ref 0.0–0.1)
Eosinophils Absolute: 0.1 10*3/uL (ref 0.0–0.5)
HCT: 37.1 % — ABNORMAL LOW (ref 38.4–49.9)
HGB: 12.6 g/dL — ABNORMAL LOW (ref 13.0–17.1)
LYMPH%: 13.7 % — ABNORMAL LOW (ref 14.0–49.0)
MCV: 106.4 fL — ABNORMAL HIGH (ref 79.3–98.0)
MONO%: 8.2 % (ref 0.0–14.0)
NEUT#: 5.9 10*3/uL (ref 1.5–6.5)
Platelets: 139 10*3/uL — ABNORMAL LOW (ref 140–400)
RDW: 18.2 % — ABNORMAL HIGH (ref 11.0–14.6)

## 2012-09-25 LAB — COMPREHENSIVE METABOLIC PANEL (CC13)
ALT: 16 U/L (ref 0–55)
AST: 25 U/L (ref 5–34)
Creatinine: 0.8 mg/dL (ref 0.7–1.3)
Total Bilirubin: 1.18 mg/dL (ref 0.20–1.20)

## 2012-09-26 ENCOUNTER — Encounter: Payer: Self-pay | Admitting: *Deleted

## 2012-09-26 NOTE — Progress Notes (Signed)
RECEIVED A FAX FROM [facility] CONCERNING A CONFIRMATION OF PRESCRIPTION SHIPMENT FOR XELODA ON 09/25/12.

## 2012-10-09 ENCOUNTER — Ambulatory Visit (HOSPITAL_COMMUNITY)
Admission: RE | Admit: 2012-10-09 | Discharge: 2012-10-09 | Disposition: A | Discharge status: Home / Self Care | Payer: Medicare Other | Source: Ambulatory Visit | Attending: Nurse Practitioner | Admitting: Nurse Practitioner

## 2012-10-09 ENCOUNTER — Other Ambulatory Visit (HOSPITAL_BASED_OUTPATIENT_CLINIC_OR_DEPARTMENT_OTHER): Payer: Medicare Other

## 2012-10-09 DIAGNOSIS — J90 Pleural effusion, not elsewhere classified: Secondary | ICD-10-CM | POA: Insufficient documentation

## 2012-10-09 DIAGNOSIS — C182 Malignant neoplasm of ascending colon: Secondary | ICD-10-CM

## 2012-10-09 DIAGNOSIS — I517 Cardiomegaly: Secondary | ICD-10-CM | POA: Insufficient documentation

## 2012-10-09 DIAGNOSIS — Z9049 Acquired absence of other specified parts of digestive tract: Secondary | ICD-10-CM | POA: Insufficient documentation

## 2012-10-09 LAB — CBC WITH DIFFERENTIAL/PLATELET
BASO%: 0.3 % (ref 0.0–2.0)
EOS%: 0.9 % (ref 0.0–7.0)
HCT: 36.4 % — ABNORMAL LOW (ref 38.4–49.9)
LYMPH%: 10.3 % — ABNORMAL LOW (ref 14.0–49.0)
MCH: 34.8 pg — ABNORMAL HIGH (ref 27.2–33.4)
MCHC: 33.2 g/dL (ref 32.0–36.0)
MONO#: 0.6 10*3/uL (ref 0.1–0.9)
NEUT%: 79.2 % — ABNORMAL HIGH (ref 39.0–75.0)
Platelets: 150 10*3/uL (ref 140–400)

## 2012-10-09 LAB — COMPREHENSIVE METABOLIC PANEL (CC13)
ALT: 14 U/L (ref 0–55)
BUN: 14 mg/dL (ref 7.0–26.0)
CO2: 28 mEq/L (ref 22–29)
Creatinine: 0.8 mg/dL (ref 0.7–1.3)
Total Bilirubin: 1.5 mg/dL — ABNORMAL HIGH (ref 0.20–1.20)

## 2012-10-09 LAB — LACTATE DEHYDROGENASE (CC13): LDH: 240 U/L (ref 125–245)

## 2012-10-09 MED ORDER — IOHEXOL 300 MG/ML  SOLN
100.0000 mL | Freq: Once | INTRAMUSCULAR | Status: AC | PRN
  Administered 2012-10-09: 100 mL via INTRAVENOUS

## 2012-10-13 ENCOUNTER — Ambulatory Visit (HOSPITAL_BASED_OUTPATIENT_CLINIC_OR_DEPARTMENT_OTHER): Payer: Medicare Other | Admitting: Oncology

## 2012-10-13 ENCOUNTER — Telehealth: Payer: Self-pay | Admitting: Oncology

## 2012-10-13 VITALS — BP 119/64 | HR 83 | Temp 96.9°F | Resp 20 | Ht 64.5 in | Wt 139.1 lb

## 2012-10-13 DIAGNOSIS — Z955 Presence of coronary angioplasty implant and graft: Secondary | ICD-10-CM

## 2012-10-13 DIAGNOSIS — C786 Secondary malignant neoplasm of retroperitoneum and peritoneum: Secondary | ICD-10-CM

## 2012-10-13 DIAGNOSIS — I25810 Atherosclerosis of coronary artery bypass graft(s) without angina pectoris: Secondary | ICD-10-CM

## 2012-10-13 DIAGNOSIS — C180 Malignant neoplasm of cecum: Secondary | ICD-10-CM

## 2012-10-13 DIAGNOSIS — C182 Malignant neoplasm of ascending colon: Secondary | ICD-10-CM

## 2012-10-13 NOTE — Telephone Encounter (Signed)
appts made and printed for pt aom °

## 2012-10-14 NOTE — Progress Notes (Signed)
Hematology and Oncology Follow Up Visit  Charles Faulkner [id number] 01-Nov-1926 78 y.o. 10/14/2012 8:33 AM   Principle Diagnosis: Encounter Diagnoses  Name Primary?  . S/P coronary artery stent placement   . 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 He was diagnosed with stage IV colon cancer with diffuse peritoneal metastases at diagnosis in April 2012. He underwent resection of the primary lesion in the cecum. He has been maintained on single agent oral Xeloda chemotherapy with an excellent and ongoing response. There has been regression of the obvious soft tissue disease on CT scans. CEA has not been a good disease activity marker in this man since it has been normal from time of diagnosis until now. He continues to do well clinically on a one-week, 1 week off schedule of his Xeloda. Initial diarrhea and hand-foot syndrome have resolved with dose adjustments and schedule change.  He had a CT scan in anticipation of today's visit on November 14 and I personally reviewed the images. There is a new, small, area of soft issue density in the area of previous surgical clips in the right pelvis. It measures about 2.2 x 2.8 cm. This is the only change on the study. He has had no clinical change. No abdominal pain. No change in bowel habits. No hematochezia. His weight is stable.   Medications: reviewed  Allergies:  Allergies  Allergen Reactions  . Metoprolol Tartrate     REACTION: hives    Review of Systems: Constitutional:   No constitutional symptoms Respiratory: No cough or dyspnea Cardiovascular:  No chest pain or palpitations Gastrointestinal: See above Genito-Urinary: No urinary tract symptoms Musculoskeletal: No muscle or  bone pain Neurologic: No headache or change in vision Skin: No rash or ecchymosis Remaining ROS negative.  Physical Exam: Blood pressure 119/64, pulse 83, temperature 96.9 F (36.1 C), temperature source Oral, resp. rate 20, height 5' 4.5" (1.638 m), weight 139 lb 1.6 oz (63.095 kg). 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 Motor strength 5 over 5. Skin: No rash or ecchymosis  Lab Results: Lab Results  Component Value Date   WBC 6.7 10/09/2012   HGB 12.1* 10/09/2012   HCT 36.4* 10/09/2012   MCV 104.6* 10/09/2012   PLT 150 10/09/2012     Chemistry      Component Value Date/Time   NA 145 10/09/2012 0931   NA 140 07/17/2012 0919   NA 143 06/05/2012 0822   K 3.2* 10/09/2012 0931   K 3.7 07/17/2012 0919   K 3.9 06/05/2012 0822   CL 109* 10/09/2012 0931   CL 105 07/17/2012 0919   CL  100 06/05/2012 0822   CO2 28 10/09/2012 0931   CO2 27 07/17/2012 0919   CO2 29 06/05/2012 0822   BUN 14.0 10/09/2012 0931   BUN 14 07/17/2012 0919   BUN 15 06/05/2012 0822   CREATININE 0.8 10/09/2012 0931   CREATININE 0.82 07/17/2012 0919   CREATININE 0.8 06/05/2012 0822      Component Value Date/Time   CALCIUM 10.3 10/09/2012 0931   CALCIUM 9.6 07/17/2012 0919   CALCIUM 9.8 06/05/2012 0822   ALKPHOS 100 10/09/2012 0931    ALKPHOS 82 07/17/2012 0919   ALKPHOS 101* 06/05/2012 0822   AST 22 10/09/2012 0931   AST 22 07/17/2012 0919   AST 41* 06/05/2012 0822   ALT 14 10/09/2012 0931   ALT 14 07/17/2012 0919   BILITOT 1.50* 10/09/2012 0931   BILITOT 1.3* 07/17/2012 0919   BILITOT 1.30 06/05/2012 1610       Radiological Studies: Ct Abdomen Pelvis W Contrast  10/09/2012  *RADIOLOGY REPORT*  Clinical Data: Colon cancer diagnosed April 2012.  Partial colectomy.  Chemotherapy in progress.  No complaints.  CT ABDOMEN AND PELVIS WITH CONTRAST  Technique:  Multidetector CT imaging of the abdomen and pelvis was performed following the standard protocol during bolus administration of intravenous contrast.  Contrast: OMNIPAQUE IOHEXOL 300 MG/ML  SOLN  Comparison: 06/05/2012, 02/13/2012 and 03/14/2011.  Findings: Mild respiratory motion degradation.  Cardiomegaly.  Coronary artery calcifications.  Mitral valve calcifications.  Bilateral pleural effusions greater on the left have increased minimally in size.  Basilar subsegmental atelectasis.  Prominent atherosclerotic type changes of the aorta with ectasia. Prominent noncalcified plaque proximal abdominal aortic region. Infrarenal 3.4 cm abdominal aortic aneurysm without change. Prominent atherosclerotic type changes common iliac arteries with aneurysmal dilation left common iliac artery measuring up to 1.8 cm unchanged.  Narrowing of the iliac arteries more notable on the right stable.  No focal hepatic lesion.  Portal vein is patent.  No calcified gallstones.  Pancreatic body is 7 mm low density structure unchanged from 2012. No focal splenic lesion.  Mild hyperplasia of the adrenal gland stable.  Renal low density structures stable larger ones which appear to be cysts.  Slight scarring inferior aspect left kidney. No hydronephrosis.  Scoliosis with degenerative changes without worrisome bony destructive lesion.  Chronically dislocated superiorly displaced right hip.  Rectosigmoid  region appears circumferentially thickened.  This may be explained by under distension.  Mild colitis not excluded in the proper clinical setting.  This is without significant change.  Anterior abdominal wall hernia through which portions of the colon and small bowel partially traverse but does not become obstructed.  Mild ascites most notable adjacent to the liver and within the pelvis.  The fluid adjacent the liver has increased in size since prior exam.  Surgical clips right pelvis adjacent to the external iliac vessels. Surrounding soft tissue has progressed in size (best appreciated when compared to the 02/13/2012 exam) now measuring 2.2 x 2.2 X 2.8 cm and suspicious for recurrent tumor.  PET CT imaging may prove helpful for further delineation.  IMPRESSION: Surgical clips right pelvis adjacent to the external iliac vessels. Surrounding soft tissue has progressed in size (best appreciated when compared to the 02/13/2012 exam) now measuring 2.2 x 2.2 X 2.8 cm and suspicious for recurrent tumor.  PET CT imaging may prove helpful for further delineation.  Minimal increase in size of pleural effusions and amount of ascites.  Rectosigmoid region appears circumferentially thickened.  This may be explained by under  distension.  Mild colitis not excluded in the proper clinical setting.  This is without significant change.  Please see above for remainder findings.  This has been made a PRA call report utilizing dashboard call feature.   Original Report Authenticated By: Lacy Duverney, M.D.     Impression and Plan: #1. Colon cancer metastatic to peritoneum at diagnosis in April 2012. Current CT with a small, new area of soft tissue density in the area of the initial surgical resection. There are no other radiographic or clinical changes. He is stable on oral Xeloda. I'm going to continue his Xeloda at the present time. Continue to monitor him clinically and radiographically. Clinical exam in 2 months repeat CT scan  in 4 months unless clinical change prior to that.  #2. Hand-foot syndrome secondary to Xeloda - Resolved   #3. Coronary artery disease status post bypass surgery, status post coronary stenting.  #4. Valvular heart disease.  #5. Cerebrovascular disease status post right carotid endarterectomy..  #6. Essential hypertension.  #7. Hyperlipidemia. Sensorineural hearing loss.  #8. Bilateral pleural effusions, stable on CT scan . Likely cardiac origin. He remains asymptomatic.       CC:. Dr. Almond Lint; Dr. Artis Delay; Dr. Franky Macho; Dr. Nanetta Batty   Levert Feinstein, MD 11/19/20138:33 AM

## 2012-10-17 ENCOUNTER — Ambulatory Visit (INDEPENDENT_AMBULATORY_CARE_PROVIDER_SITE_OTHER): Payer: Medicare Other | Admitting: General Surgery

## 2012-10-17 VITALS — BP 132/80 | HR 62 | Temp 97.0°F | Resp 18 | Ht 67.0 in | Wt 139.4 lb

## 2012-10-17 DIAGNOSIS — C182 Malignant neoplasm of ascending colon: Secondary | ICD-10-CM

## 2012-10-17 NOTE — Progress Notes (Signed)
HISTORY: Patient is now approximately 18 months status post right hemicolectomy for stage for obstructing colon cancer. He has done quite well. He is currently on Xeloda 7 days on, 7 days off.  He denies abdominal pain. He denies blood in stool. He denies nausea, vomiting, weight loss. He has actually continued to gain weight and stay stable.   His energy level is good.  He has many medical problems, but is actually doing well despite them.   PERTINENT REVIEW OF SYSTEMS: Otherwise negative.     EXAM: Head: Normocephalic and atraumatic.  Eyes:  Conjunctivae are normal. Pupils are equal, round, and reactive to light. No scleral icterus.  Neck:  Normal range of motion. Neck supple. No tracheal deviation present. No thyromegaly present.  Resp: No respiratory distress, normal effort. Abd:  Abdomen is soft, non distended and non tender. No masses are palpable.  There is no rebound and no guarding.   Large ventral hernia present.   Neurological: Alert and oriented to person, place, and time. Coordination impaired.  Uses walker.    Skin: Skin is warm and dry. No rash noted. No diaphoretic. No erythema. No pallor.  Psychiatric: Normal mood and affect. Normal behavior. Judgment and thought content normal.      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   Clinically stable disease with small area in pelvis.  Continuing on xeloda.     Ventral hernia stable.  Continue avoiding heavy lifting.  Binder when out and about.    Maudry Diego, MD Surgical Oncology, General & Endocrine Surgery [facility]  [name]., MD Elfredia Nevins, MD

## 2012-10-17 NOTE — Assessment & Plan Note (Signed)
Doing well.   Clinically stable disease with small area in pelvis.  Continuing on xeloda.

## 2012-10-17 NOTE — Patient Instructions (Signed)
Follow up in 6 months 

## 2012-10-21 ENCOUNTER — Other Ambulatory Visit: Payer: Self-pay | Admitting: *Deleted

## 2012-10-21 DIAGNOSIS — C182 Malignant neoplasm of ascending colon: Secondary | ICD-10-CM

## 2012-10-21 MED ORDER — CAPECITABINE 500 MG PO TABS: ORAL_TABLET | ORAL | Status: DC

## 2012-10-21 NOTE — Telephone Encounter (Signed)
RECEIVED A FAX FROM [facility] CONCERNING A CONFIRMATION OF FACSIMILE RECEIPT FOR PT. REFERRAL. 

## 2012-10-21 NOTE — Telephone Encounter (Signed)
THIS REFILL REQUEST FOR XELODA WAS PLACED IN DR.GRANFORTUNA'S ACTIVE WORK BOX. 

## 2012-10-24 NOTE — Telephone Encounter (Signed)
RECEIVED A FAX FROM [facility] CONCERNING A CONFIRMATION OF PRESCRIPTION SHIPMENT FOR XELODA ON 10/22/12.

## 2012-11-10 ENCOUNTER — Other Ambulatory Visit (HOSPITAL_COMMUNITY): Payer: Self-pay | Admitting: Cardiovascular Disease

## 2012-11-10 DIAGNOSIS — I714 Abdominal aortic aneurysm, without rupture: Secondary | ICD-10-CM

## 2012-12-22 ENCOUNTER — Other Ambulatory Visit (HOSPITAL_BASED_OUTPATIENT_CLINIC_OR_DEPARTMENT_OTHER): Payer: Medicare Other | Admitting: Lab

## 2012-12-22 ENCOUNTER — Ambulatory Visit (HOSPITAL_BASED_OUTPATIENT_CLINIC_OR_DEPARTMENT_OTHER): Payer: Medicare Other | Admitting: Nurse Practitioner

## 2012-12-22 ENCOUNTER — Encounter: Payer: Self-pay | Admitting: *Deleted

## 2012-12-22 ENCOUNTER — Telehealth: Payer: Self-pay | Admitting: Oncology

## 2012-12-22 VITALS — BP 134/59 | HR 91 | Temp 96.7°F | Resp 18 | Ht 67.0 in | Wt 140.8 lb

## 2012-12-22 DIAGNOSIS — C182 Malignant neoplasm of ascending colon: Secondary | ICD-10-CM

## 2012-12-22 DIAGNOSIS — C180 Malignant neoplasm of cecum: Secondary | ICD-10-CM

## 2012-12-22 DIAGNOSIS — L270 Generalized skin eruption due to drugs and medicaments taken internally: Secondary | ICD-10-CM

## 2012-12-22 DIAGNOSIS — C786 Secondary malignant neoplasm of retroperitoneum and peritoneum: Secondary | ICD-10-CM

## 2012-12-22 LAB — CBC WITH DIFFERENTIAL/PLATELET
Basophils Absolute: 0 10*3/uL (ref 0.0–0.1)
Eosinophils Absolute: 0.1 10*3/uL (ref 0.0–0.5)
HCT: 34.4 % — ABNORMAL LOW (ref 38.4–49.9)
HGB: 11.8 g/dL — ABNORMAL LOW (ref 13.0–17.1)
MCV: 104.1 fL — ABNORMAL HIGH (ref 79.3–98.0)
NEUT#: 4.3 10*3/uL (ref 1.5–6.5)
NEUT%: 74 % (ref 39.0–75.0)
RDW: 18.7 % — ABNORMAL HIGH (ref 11.0–14.6)
lymph#: 0.8 10*3/uL — ABNORMAL LOW (ref 0.9–3.3)

## 2012-12-22 LAB — LACTATE DEHYDROGENASE (CC13): LDH: 219 U/L (ref 125–245)

## 2012-12-22 LAB — COMPREHENSIVE METABOLIC PANEL (CC13)
Albumin: 3.5 g/dL (ref 3.5–5.0)
BUN: 12.6 mg/dL (ref 7.0–26.0)
CO2: 25 mEq/L (ref 22–29)
Calcium: 9.9 mg/dL (ref 8.4–10.4)
Glucose: 128 mg/dl — ABNORMAL HIGH (ref 70–99)
Potassium: 3.9 mEq/L (ref 3.5–5.1)
Sodium: 141 mEq/L (ref 136–145)
Total Protein: 6.7 g/dL (ref 6.4–8.3)

## 2012-12-22 MED ORDER — ONDANSETRON 4 MG PO TBDP: ORAL_TABLET | ORAL | Status: DC

## 2012-12-22 NOTE — Telephone Encounter (Signed)
Gave pt appt for lab before CT then MD on March 2014, gave pt oral contrast for CT

## 2012-12-22 NOTE — Progress Notes (Signed)
RECEIVED A FAX FROM [facility] CONCERNING A CONFIRMATION OF PRESCRIPTION SHIPMENT FOR XELODA ON 12/19/12. 

## 2012-12-22 NOTE — Progress Notes (Signed)
OFFICE PROGRESS NOTE  Interval history:   Charles Faulkner is an 79 year old man with stage IV colon cancer with diffuse peritoneal metastases at time of diagnosis in April 2012. He underwent resection of the primary lesion in the cecum. He has since been maintained on single agent oral Xeloda with an ongoing response. Most recent restaging CT evaluation 10/09/2012 showed a new small area of soft tissue density in the area of previous surgical clips in the right pelvis measuring approximately 2.2 x 2.8 cm. The scan was otherwise unchanged. Xeloda was continued at the same dose/schedule.  Charles Faulkner feels well. No nausea or vomiting. No mouth sores. No diarrhea or constipation. He denies abdominal pain. Hands continue to be dry. No hand pain. He is applying a new lotion. He has a good appetite and energy level.    Objective: Blood pressure 134/59, pulse 91, temperature 96.7 F (35.9 C), temperature source Oral, resp. rate 18, height 5\' 7"  (1.702 m), weight 140 lb 12.8 oz (63.866 kg).  Oropharynx is without thrush or ulceration. No palpable cervical, supraclavicular or axillary lymph nodes. Lungs are clear. Slightly irregular cardiac rhythm. 2/6 systolic murmur. Abdomen is soft and nontender. No organomegaly. No mass. Trace lower leg edema bilaterally right greater than left. Motor strength 5 over 5. Hearing deficit. Palms are dry appearing. No erythema or skin breakdown to  Lab Results: Lab Results  Component Value Date   WBC 5.8 12/22/2012   HGB 11.8* 12/22/2012   HCT 34.4* 12/22/2012   MCV 104.1* 12/22/2012   PLT 165 12/22/2012    Chemistry:    Chemistry      Component Value Date/Time   NA 145 10/09/2012 0931   NA 140 07/17/2012 0919   NA 143 06/05/2012 0822   K 3.2* 10/09/2012 0931   K 3.7 07/17/2012 0919   K 3.9 06/05/2012 0822   CL 109* 10/09/2012 0931   CL 105 07/17/2012 0919   CL 100 06/05/2012 0822   CO2 28 10/09/2012 0931   CO2 27 07/17/2012 0919   CO2 29 06/05/2012 0822   BUN 14.0  10/09/2012 0931   BUN 14 07/17/2012 0919   BUN 15 06/05/2012 0822   CREATININE 0.8 10/09/2012 0931   CREATININE 0.82 07/17/2012 0919   CREATININE 0.8 06/05/2012 0822      Component Value Date/Time   CALCIUM 10.3 10/09/2012 0931   CALCIUM 9.6 07/17/2012 0919   CALCIUM 9.8 06/05/2012 0822   ALKPHOS 100 10/09/2012 0931   ALKPHOS 82 07/17/2012 0919   ALKPHOS 101* 06/05/2012 0822   AST 22 10/09/2012 0931   AST 22 07/17/2012 0919   AST 41* 06/05/2012 0822   ALT 14 10/09/2012 0931   ALT 14 07/17/2012 0919   BILITOT 1.50* 10/09/2012 0931   BILITOT 1.3* 07/17/2012 0919   BILITOT 1.30 06/05/2012 0822       Studies/Results: No results found.  Medications: I have reviewed the patient's current medications.  Assessment/Plan:  1. Stage IV colon cancer with peritoneal metastases at diagnosis April 2012. He continues of Xeloda on a 1 week on/1 week off schedule. Restaging CT evaluation 09/24/2011 showed resolution of peritoneal metastases. Restaging CT evaluation 10/09/2012 showed a new small area of soft tissue density in the area of previous surgical clips in the right pelvis measuring approximately 2.2 x 2.8 cm. Skin was otherwise unchanged. Xeloda continued.  2. Hand-foot syndrome secondary to Xeloda-stable. He will continue to apply lotion. 3. Coronary artery disease. 4. Valvular heart disease. 5. Cerebrovascular disease status post  right carotid endarterectomy.. 6. Essential hypertension. 7. Hyperlipidemia. 8. Sensorineural hearing loss. 9. Bilateral pleural effusions, stable on CT scan 06/05/2012. Likely cardiac origin. He is asymptomatic.  Disposition-Charles Faulkner appears well. He is stable clinically. Plan to continue Xeloda at the current dose/schedule with restaging CT evaluation in approximately 8 weeks which will be at a four-month interval from the previous study. He will return for a followup visit with Dr. [name] on 02/10/2013 to review the results. He will contact the office in the  interim with any problems.  Lonna Cobb ANP/GNP-BC    CC Dr. Almond Lint, Dr. Artis Delay, Dr. Franky Macho and Dr. Nanetta Batty.

## 2013-01-19 ENCOUNTER — Encounter: Payer: Self-pay | Admitting: *Deleted

## 2013-01-19 NOTE — Progress Notes (Signed)
RECEIVED A FAX FROM [facility] CONCERNING A CONFIRMATION OF PRESCRIPTION SHIPMENT FOR XELODA ON 01/16/13.

## 2013-01-26 ENCOUNTER — Other Ambulatory Visit: Payer: Medicare Other

## 2013-02-02 ENCOUNTER — Other Ambulatory Visit (HOSPITAL_BASED_OUTPATIENT_CLINIC_OR_DEPARTMENT_OTHER): Payer: Medicare Other

## 2013-02-02 ENCOUNTER — Encounter (HOSPITAL_COMMUNITY): Payer: Self-pay

## 2013-02-02 ENCOUNTER — Ambulatory Visit (HOSPITAL_COMMUNITY)
Admission: RE | Admit: 2013-02-02 | Discharge: 2013-02-02 | Disposition: A | Discharge status: Home / Self Care | Payer: Medicare Other | Source: Ambulatory Visit | Attending: Nurse Practitioner | Admitting: Nurse Practitioner

## 2013-02-02 DIAGNOSIS — C182 Malignant neoplasm of ascending colon: Secondary | ICD-10-CM

## 2013-02-02 DIAGNOSIS — I708 Atherosclerosis of other arteries: Secondary | ICD-10-CM | POA: Insufficient documentation

## 2013-02-02 DIAGNOSIS — N289 Disorder of kidney and ureter, unspecified: Secondary | ICD-10-CM | POA: Insufficient documentation

## 2013-02-02 DIAGNOSIS — J9819 Other pulmonary collapse: Secondary | ICD-10-CM | POA: Insufficient documentation

## 2013-02-02 DIAGNOSIS — I517 Cardiomegaly: Secondary | ICD-10-CM | POA: Insufficient documentation

## 2013-02-02 DIAGNOSIS — J984 Other disorders of lung: Secondary | ICD-10-CM | POA: Insufficient documentation

## 2013-02-02 DIAGNOSIS — I77819 Aortic ectasia, unspecified site: Secondary | ICD-10-CM | POA: Insufficient documentation

## 2013-02-02 DIAGNOSIS — I7411 Embolism and thrombosis of thoracic aorta: Secondary | ICD-10-CM | POA: Insufficient documentation

## 2013-02-02 DIAGNOSIS — M4120 Other idiopathic scoliosis, site unspecified: Secondary | ICD-10-CM | POA: Insufficient documentation

## 2013-02-02 DIAGNOSIS — R1909 Other intra-abdominal and pelvic swelling, mass and lump: Secondary | ICD-10-CM | POA: Insufficient documentation

## 2013-02-02 DIAGNOSIS — Z79899 Other long term (current) drug therapy: Secondary | ICD-10-CM | POA: Insufficient documentation

## 2013-02-02 DIAGNOSIS — C180 Malignant neoplasm of cecum: Secondary | ICD-10-CM

## 2013-02-02 DIAGNOSIS — I700 Atherosclerosis of aorta: Secondary | ICD-10-CM | POA: Insufficient documentation

## 2013-02-02 DIAGNOSIS — Z9049 Acquired absence of other specified parts of digestive tract: Secondary | ICD-10-CM | POA: Insufficient documentation

## 2013-02-02 LAB — COMPREHENSIVE METABOLIC PANEL (CC13)
ALT: 17 U/L (ref 0–55)
AST: 23 U/L (ref 5–34)
Albumin: 3.5 g/dL (ref 3.5–5.0)
Alkaline Phosphatase: 96 U/L (ref 40–150)
Glucose: 136 mg/dl — ABNORMAL HIGH (ref 70–99)
Potassium: 3.7 mEq/L (ref 3.5–5.1)
Sodium: 143 mEq/L (ref 136–145)
Total Bilirubin: 1.43 mg/dL — ABNORMAL HIGH (ref 0.20–1.20)
Total Protein: 6.6 g/dL (ref 6.4–8.3)

## 2013-02-02 LAB — CBC WITH DIFFERENTIAL/PLATELET
Basophils Absolute: 0 10*3/uL (ref 0.0–0.1)
EOS%: 1.1 % (ref 0.0–7.0)
Eosinophils Absolute: 0.1 10*3/uL (ref 0.0–0.5)
HCT: 34.8 % — ABNORMAL LOW (ref 38.4–49.9)
HGB: 11.5 g/dL — ABNORMAL LOW (ref 13.0–17.1)
MCH: 34.3 pg — ABNORMAL HIGH (ref 27.2–33.4)
MONO#: 0.8 10*3/uL (ref 0.1–0.9)
NEUT#: 5.7 10*3/uL (ref 1.5–6.5)
NEUT%: 77.1 % — ABNORMAL HIGH (ref 39.0–75.0)
lymph#: 0.8 10*3/uL — ABNORMAL LOW (ref 0.9–3.3)

## 2013-02-02 MED ORDER — IOHEXOL 300 MG/ML  SOLN
100.0000 mL | Freq: Once | INTRAMUSCULAR | Status: AC | PRN
  Administered 2013-02-02: 100 mL via INTRAVENOUS

## 2013-02-09 ENCOUNTER — Encounter (HOSPITAL_COMMUNITY): Payer: Medicare Other

## 2013-02-10 ENCOUNTER — Encounter (HOSPITAL_COMMUNITY): Payer: Medicare Other

## 2013-02-10 ENCOUNTER — Telehealth: Payer: Self-pay | Admitting: Oncology

## 2013-02-10 ENCOUNTER — Ambulatory Visit (HOSPITAL_BASED_OUTPATIENT_CLINIC_OR_DEPARTMENT_OTHER): Payer: Medicare Other | Admitting: Oncology

## 2013-02-10 ENCOUNTER — Other Ambulatory Visit: Payer: Self-pay | Admitting: *Deleted

## 2013-02-10 VITALS — BP 132/69 | HR 95 | Temp 97.3°F | Resp 20 | Ht 67.0 in | Wt 140.5 lb

## 2013-02-10 DIAGNOSIS — C786 Secondary malignant neoplasm of retroperitoneum and peritoneum: Secondary | ICD-10-CM

## 2013-02-10 DIAGNOSIS — C182 Malignant neoplasm of ascending colon: Secondary | ICD-10-CM

## 2013-02-10 NOTE — Telephone Encounter (Signed)
gv and printed appt schedule for April

## 2013-02-10 NOTE — Progress Notes (Signed)
Hematology and Oncology Follow Up Visit  Charles Faulkner [id number] 03-15-1926 79 y.o. 02/10/2013 9:01 PM   Principle Diagnosis: Encounter Diagnosis  Name Primary?  . Stage IV cancer of cecum T4b N2b M1, peritoneal mets Yes     Interim History:   Followup visit for this jovial 80 year old man with multiple medical problems including advanced coronary artery disease status post bypass grafting status post stent, valvular heart disease, peripheral vascular disease. He was diagnosed with stage IV colon cancer with diffuse peritoneal metastases at diagnosis in April 2012. He underwent resection of the primary lesion in the cecum. He has been maintained on single agent oral Xeloda chemotherapy with an excellent and ongoing response. There has been regression of the obvious soft tissue disease on CT scans. CEA has not been a good disease activity marker in this man since it has been normal from time of diagnosis until now.  He continues to do well clinically on a one-week, 1 week off schedule of his Xeloda. Initial diarrhea and hand-foot syndrome have resolved with dose adjustments and schedule change.  On a CT scan done 10/09/2012 who is a new area of soft tissue density in the region of previous surgical clips in the right pelvis which radiologist measured at 2.2 x 2.8 cm. There were no other changes to suggest progression. I elected to continue his overload on the same dose and schedule. A followup scan was done in anticipation of today's visit on 02/02/2013 and I personally reviewed these images. The area of concern in the right lower quadrant is now more prominent measuring 3.0 x 2.7 cm. No peritoneal nodularity or ascites is noted. No liver involvement is suspected. The patient remains clinically stable.   Medications: reviewed  Allergies:  Allergies  Allergen Reactions  . Metoprolol Tartrate     REACTION: hives    Review of Systems: Constitutional:   No constitutional symptoms and  weight is stable 141 pounds. Respiratory: No cough or dyspnea Cardiovascular:  No chest pain Gastrointestinal: No abdominal pain, no change in bowel habits, no hematochezia or melena Genito-Urinary: No urinary tract symptoms Musculoskeletal: Chronic orthopedic problems Neurologic: No headache or change in vision Skin: No rash or ecchymosis Remaining ROS negative.  Physical Exam: Blood pressure 132/69, pulse 95, temperature 97.3 F (36.3 C), temperature source Oral, resp. rate 20, height 5\' 7"  (1.702 m), weight 140 lb 8 oz (63.73 kg). 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 Motor strength 5 over 5. Skin: No rash or ecchymosis  Lab Results: Lab Results  Component Value Date   WBC 7.4 02/02/2013   HGB 11.5* 02/02/2013   HCT 34.8* 02/02/2013   MCV 103.7* 02/02/2013   PLT 154 02/02/2013     Chemistry      Component Value Date/Time   NA 143 02/02/2013 0929   NA 140 07/17/2012 0919   NA 143 06/05/2012 0822   K 3.7 02/02/2013 0929   K 3.7 07/17/2012 0919  K 3.9 06/05/2012 0822   CL 106 02/02/2013 0929   CL 105 07/17/2012 0919   CL 100 06/05/2012 0822   CO2 27 02/02/2013 0929   CO2 27 07/17/2012 0919   CO2 29 06/05/2012 0822   BUN 14.9 02/02/2013 0929   BUN 14 07/17/2012 0919   BUN 15 06/05/2012 0822   CREATININE 0.9 02/02/2013 0929   CREATININE 0.82 07/17/2012 0919   CREATININE 0.8 06/05/2012 0822       Component Value Date/Time   CALCIUM 10.0 02/02/2013 0929   CALCIUM 9.6 07/17/2012 0919   CALCIUM 9.8 06/05/2012 0822   ALKPHOS 96 02/02/2013 0929   ALKPHOS 82 07/17/2012 0919   ALKPHOS 101* 06/05/2012 0822   AST 23 02/02/2013 0929   AST 22 07/17/2012 0919   AST 41* 06/05/2012 0822   ALT 17 02/02/2013 0929   ALT 14 07/17/2012 0919   BILITOT 1.43* 02/02/2013 0929   BILITOT 1.3* 07/17/2012 0919   BILITOT 1.30 06/05/2012 1610       Radiological Studies: Ct Abdomen Pelvis W Contrast  02/02/2013  *RADIOLOGY REPORT*  Clinical Data: Restaging colon cancer diagnosed in April 2012. Chemotherapy ongoing.  History of partial colectomy.  CT ABDOMEN AND PELVIS WITH CONTRAST  Technique:  Multidetector CT imaging of the abdomen and pelvis was performed following the standard protocol during bolus administration of intravenous contrast.  Contrast: OMNIPAQUE IOHEXOL 300 MG/ML  SOLN  Comparison: Prior examinations 10/09/2012 and 06/05/2012.  Findings: The left greater than right pleural effusions demonstrate slight further enlargement compared with the prior study.  The resulting bibasilar atelectasis is unchanged.  There is stable cardiomegaly and mural thrombus within the distal thoracic aorta.  The liver has a stable appearance without focal lesion. The gallbladder, pancreas, spleen and adrenal glands appear unchanged. Mild renal cortical scarring and low density lesions bilaterally are unchanged.  There is no hydronephrosis.  There has been further enlargement in the low density mass surrounding the surgical clips in the right lower quadrant.  This now measures 3.0 x 2.7 cm on image 57 (previously 2.2 x 2.2 cm). This remains concerning for a nodal metastasis.  No other enlarged abdominal pelvic lymph nodes are identified.  There is no progressive ascites or peritoneal nodularity.  A small amount of perihepatic ascites is unchanged.  There is no evidence of focal bowel lesion or obstruction.  A broad-based ventral  hernia appears unchanged without associated obstruction.  There is extensive aorto iliac atherosclerosis and ectasia.  The distal aorta measures up to 3.1 cm AP.  The bladder and prostate gland appear unchanged.  There is a significant convex right scoliosis.  Chronic dislocation of the right hip and partial resorption of the right femoral head are stable.  No worrisome osseous lesions are identified.  IMPRESSION:  1.  Further enlargement of low density right pelvic mass surrounding the surgical clips, concerning for local recurrence or nodal metastasis. 2.  No other evidence of metastatic disease. 3. Further slight enlargement of bilateral pleural effusions. 4.  Unchanged aortoiliac atherosclerosis, scoliosis, broad-based ventral hernia and chronic dislocation of the right hip.   Original Report Authenticated By: Carey Bullocks, M.D.     Impression and Plan: Colon cancer metastatic at diagnosis with disease limited to the peritoneal cavity. Remarkably stable disease on single agent oral Xeloda for almost 2 years. Slowly enlarging soft tissue mass/nodal mass in the area of previous surgical resection of the cecum. No other obvious active disease although CT scan can be misleading for peritoneal  disease as it was in this patient at time of initial diagnosis. It is unclear in my mind whether it would be worthwhile to offer him a local treatment such as intensity modulated radiation therapy or radiofrequency ablation of this single area. I would like to present his case at the GI conference tomorrow to see if anybody thinks it is reasonable to treat this single area of persistent and slowly progressive disease. I will schedule a short interval followup visit for a final disposition. I told him for the time being to take a break from the Xeloda.   #2. Coronary artery disease status post bypass surgery status post coronary stenting.  #3. Mitral and aortic valvular disease.  #4. Cerebrovascular disease  status post right carotid endarterectomy.  #5. Peripheral vascular disease.  #6. Bilateral pleural effusions left greater than right. Likely Cardiac origin stable on current CT scan compared with previous study.Currently asymptomatic.  #7. Sensorineural hearing deficits   CC:. Dr Almond Lint; Dr. Army Chaco; Dr. Franky Macho; Dr. York Ram   Levert Feinstein, MD 3/18/20149:01 PM

## 2013-02-10 NOTE — Patient Instructions (Signed)
Stop Xeloda for now We will see you back on April 2nd to discuss treatment options after we review your case in conference

## 2013-02-10 NOTE — Telephone Encounter (Signed)
THIS REFILL REQUEST FOR XELODA WAS PLACED IN DR.GRANFORTUNA'S ACTIVE WORK BOX.

## 2013-02-15 ENCOUNTER — Other Ambulatory Visit: Payer: Self-pay | Admitting: Oncology

## 2013-02-15 DIAGNOSIS — C182 Malignant neoplasm of ascending colon: Secondary | ICD-10-CM

## 2013-02-23 ENCOUNTER — Ambulatory Visit
Admission: RE | Admit: 2013-02-23 | Discharge: 2013-02-23 | Disposition: A | Discharge status: Home / Self Care | Payer: Medicare Other | Source: Ambulatory Visit | Attending: Radiation Oncology | Admitting: Radiation Oncology

## 2013-02-23 ENCOUNTER — Encounter: Payer: Self-pay | Admitting: Radiation Oncology

## 2013-02-23 DIAGNOSIS — E785 Hyperlipidemia, unspecified: Secondary | ICD-10-CM | POA: Insufficient documentation

## 2013-02-23 DIAGNOSIS — I739 Peripheral vascular disease, unspecified: Secondary | ICD-10-CM | POA: Insufficient documentation

## 2013-02-23 DIAGNOSIS — C182 Malignant neoplasm of ascending colon: Secondary | ICD-10-CM

## 2013-02-23 DIAGNOSIS — Z79899 Other long term (current) drug therapy: Secondary | ICD-10-CM | POA: Insufficient documentation

## 2013-02-23 DIAGNOSIS — Z87891 Personal history of nicotine dependence: Secondary | ICD-10-CM | POA: Insufficient documentation

## 2013-02-23 DIAGNOSIS — Z85828 Personal history of other malignant neoplasm of skin: Secondary | ICD-10-CM | POA: Insufficient documentation

## 2013-02-23 DIAGNOSIS — Z951 Presence of aortocoronary bypass graft: Secondary | ICD-10-CM | POA: Insufficient documentation

## 2013-02-23 DIAGNOSIS — C180 Malignant neoplasm of cecum: Secondary | ICD-10-CM | POA: Insufficient documentation

## 2013-02-23 DIAGNOSIS — I10 Essential (primary) hypertension: Secondary | ICD-10-CM | POA: Insufficient documentation

## 2013-02-23 DIAGNOSIS — I252 Old myocardial infarction: Secondary | ICD-10-CM | POA: Insufficient documentation

## 2013-02-23 DIAGNOSIS — I2510 Atherosclerotic heart disease of native coronary artery without angina pectoris: Secondary | ICD-10-CM | POA: Insufficient documentation

## 2013-02-23 DIAGNOSIS — C786 Secondary malignant neoplasm of retroperitoneum and peritoneum: Secondary | ICD-10-CM | POA: Insufficient documentation

## 2013-02-23 HISTORY — DX: Malignant neoplasm of colon, unspecified: C18.9

## 2013-02-23 HISTORY — DX: Allergy, unspecified, initial encounter: T78.40XA

## 2013-02-23 NOTE — Progress Notes (Signed)
Please see the Nurse Progress Note in the MD Initial Consult Encounter for this patient. 

## 2013-02-23 NOTE — Progress Notes (Signed)
Patient for radiation consultation of stage IV colon cancer and peritoneum metastasisdiagnosed in 2012.Patient on break from xeloda post 2 years of therapy.Patient has slowly enlarging soft tissue mass/nodal mass of area of surgical resection of cecum.To consider IMRT or radiofrequency ablation.

## 2013-02-23 NOTE — Progress Notes (Addendum)
Patient arrived with walker, wife at side, alert,oriented x3, no c/o pain, has 10+ stools weekly,no stool softners, off Xeloda since 02/10/13 right eye reddened on lid s/p surgery, hard of hearing, eating well, no c/o nausea,  No children  No History Radiation therapy No History of a Pacemaker

## 2013-02-24 NOTE — Progress Notes (Signed)
Radiation Oncology         ([phone]) [phone] ________________________________  Name: Charles Faulkner MRN: [id number]  Date: 02/23/2013  DOB: 1925/12/18  WU:[id number],JYNWGNFA J., MD  Levert Feinstein, MD     REFERRING PHYSICIAN: Levert Feinstein, MD   DIAGNOSIS: The encounter diagnosis was Cancer of right colon.   HISTORY OF PRESENT ILLNESS::Charles Faulkner is a 79 y.o. male who is seen for an initial consultation visit. The patient has a history of stage IV cancer of the cecum. He initially was diagnosed in April of 2012 and he was found at the time of diagnosis to have diffuse peritoneal metastases. The patient did undergo resection of the primary lesion. He subsequently has been maintained on single agent is a load of chemotherapy and he has had an excellent response. This is then accompanied by significant regression of the visible disease. Tumor markers have not been helpful in terms of following the patient area  The patient did have a new soft tissue density in the right pelvis on CT imaging on 10/09/2012. This measured 2.0 cm in maximum dimension at that time and there was no other evidence of progression. The patient continued on Xeloda and recently has had repeat imaging including a CT scan of the abdomen or pelvis on 02/02/2013. The right pelvic mass is increased in size now measuring 3.0 cm in maximum dimension. No other evidence for metastatic disease currently. Given this isolated finding with the patient continuing to do very well overall, I have been asked to see the patient today for consideration of palliative radiotherapy.  The patient currently denies any pain in this area. He has no other areas of pain distantly as well. He states his bowel movements have been good and he has not noticed any bleeding or dark stools.   PREVIOUS RADIATION THERAPY: No   PAST MEDICAL HISTORY:  has a past medical history of CAD (coronary artery disease); PAD (peripheral artery disease);  Hyperlipemia; Aortic stenosis, moderate; A-fib; AAA (abdominal aortic aneurysm); Blood transfusion; Heart murmur; Hypertension; Heart attack (1989, 2002); Hearing loss of both ears (11/13/2011); Mitral stenosis and aortic insufficiency (11/13/2011); Aortic valve stenosis, moderate (11/13/2011); Chronic atrial fibrillation (11/13/2011); Benign essential HTN (11/13/2011); Basal cell carcinoma of ear (11/13/2011); S/P coronary artery stent placement (11/13/2011); Peripheral vascular disease, asymptomatic (11/13/2011); Cancer; Colon cancer; IDA (iron deficiency anemia); and Allergy.     PAST SURGICAL HISTORY: Past Surgical History  Procedure Laterality Date  . Sb capsule endoscopy  11/2004    ?nonbleeding leiomyomas in distal SB  . Esophagogastroduodenoscopy  01/2005    with enteroscopy, normal through proximal jejunum  . Colonoscopy  01/2005    with terminal ileoscopy, normal exam except internal hemorrhoids  . Skin cancer removal    . Coronary artery bypass graft  2002  . Carotid endarterectomy  2006    right  . Rca otium stent  2005  . Colon surgery       FAMILY HISTORY: family history includes Cancer in his sister and Heart disease in his father and mother.  There is no history of Colon cancer, and Liver disease, and Inflammatory bowel disease, .   SOCIAL HISTORY:  reports that he has quit smoking. He has never used smokeless tobacco. He reports that he does not drink alcohol or use illicit drugs.   ALLERGIES: Metoprolol tartrate   MEDICATIONS:  Current Outpatient Prescriptions  Medication Sig Dispense Refill  . clopidogrel (PLAVIX) 75 MG tablet Take 75 mg by mouth daily.        Marland Kitchen  digoxin (LANOXIN) 0.125 MG tablet 0.125 mg Daily.      . hydrochlorothiazide 25 MG tablet Take 0.5 tablets by mouth daily.      . lansoprazole (PREVACID) 15 MG capsule Take 15 mg by mouth daily.        Marland Kitchen LIPITOR 40 MG tablet Take 1 tablet by mouth daily.      . niacin (NIASPAN) 500 MG CR tablet Take 500  mg by mouth at bedtime. 3 tabs daily      . potassium chloride SA (KLOR-CON M20) 20 MEQ tablet Take 20 mEq by mouth daily.       . TENORMIN 50 MG tablet Take 25 mg by mouth 2 (two) times daily.       Marland Kitchen ZETIA 10 MG tablet Take 1 tablet by mouth daily.      . capecitabine (XELODA) 500 MG tablet take 1500mg  = 3 tabs po after breakfast & 2 tabs = 1000mg  after evening meal for 7days on , 7 days off. [facility] - [phone]  70 tablet  3  . Multiple Vitamins-Minerals ([employer] SPECTRAVITE SENIOR PO) Take 1 tablet by mouth daily.        . ondansetron (ZOFRAN-ODT) 4 MG disintegrating tablet Take 1 tablet twice daily prior to chemotherapy 7 days on/7 days off.  60 tablet  5  . polyethylene glycol powder (GLYCOLAX/MIRALAX) powder as needed.       No current facility-administered medications for this encounter.     REVIEW OF SYSTEMS:  A 15 point review of systems is documented in the electronic medical record. This was obtained by the nursing staff. However, I reviewed this with the patient to discuss relevant findings and make appropriate changes.  Pertinent items are noted in HPI.    PHYSICAL EXAM:  vitals were not taken for this visit.  General: Well-developed, in no acute distress HEENT: Normocephalic, atraumatic; oral cavity clear Neck: Supple without any lymphadenopathy Cardiovascular: Regular rate and rhythm Respiratory: Clear to auscultation bilaterally GI: Soft, nontender, normal bowel sounds Extremities: No edema present Neuro: No focal deficits     LABORATORY DATA:  Lab Results  Component Value Date   WBC 7.4 02/02/2013   HGB 11.5* 02/02/2013   HCT 34.8* 02/02/2013   MCV 103.7* 02/02/2013   PLT 154 02/02/2013   Lab Results  Component Value Date   NA 143 02/02/2013   K 3.7 02/02/2013   CL 106 02/02/2013   CO2 27 02/02/2013   Lab Results  Component Value Date   ALT 17 02/02/2013   AST 23 02/02/2013   ALKPHOS 96 02/02/2013   BILITOT 1.43* 02/02/2013      RADIOGRAPHY: Ct Abdomen  Pelvis W Contrast  02/02/2013  *RADIOLOGY REPORT*  Clinical Data: Restaging colon cancer diagnosed in April 2012. Chemotherapy ongoing.  History of partial colectomy.  CT ABDOMEN AND PELVIS WITH CONTRAST  Technique:  Multidetector CT imaging of the abdomen and pelvis was performed following the standard protocol during bolus administration of intravenous contrast.  Contrast: OMNIPAQUE IOHEXOL 300 MG/ML  SOLN  Comparison: Prior examinations 10/09/2012 and 06/05/2012.  Findings: The left greater than right pleural effusions demonstrate slight further enlargement compared with the prior study.  The resulting bibasilar atelectasis is unchanged.  There is stable cardiomegaly and mural thrombus within the distal thoracic aorta.  The liver has a stable appearance without focal lesion. The gallbladder, pancreas, spleen and adrenal glands appear unchanged. Mild renal cortical scarring and low density lesions bilaterally are unchanged.  There  is no hydronephrosis.  There has been further enlargement in the low density mass surrounding the surgical clips in the right lower quadrant.  This now measures 3.0 x 2.7 cm on image 57 (previously 2.2 x 2.2 cm). This remains concerning for a nodal metastasis.  No other enlarged abdominal pelvic lymph nodes are identified.  There is no progressive ascites or peritoneal nodularity.  A small amount of perihepatic ascites is unchanged.  There is no evidence of focal bowel lesion or obstruction.  A broad-based ventral hernia appears unchanged without associated obstruction.  There is extensive aorto iliac atherosclerosis and ectasia.  The distal aorta measures up to 3.1 cm AP.  The bladder and prostate gland appear unchanged.  There is a significant convex right scoliosis.  Chronic dislocation of the right hip and partial resorption of the right femoral head are stable.  No worrisome osseous lesions are identified.  IMPRESSION:  1.  Further enlargement of low density right pelvic mass  surrounding the surgical clips, concerning for local recurrence or nodal metastasis. 2.  No other evidence of metastatic disease. 3. Further slight enlargement of bilateral pleural effusions. 4.  Unchanged aortoiliac atherosclerosis, scoliosis, broad-based ventral hernia and chronic dislocation of the right hip.   Original Report Authenticated By: Carey Bullocks, M.D.        IMPRESSION: The patient is a pleasant gentleman with stage IV colon cancer with a history of diffuse peritoneal metastases. He has done excellent on Xeloda after resection of his primary tumor. He has one isolated area of progression within the right pelvis. I do believe that the patient may benefit from focused radiotherapy to this area. Additionally, this would help prevent local symptoms do to further progression and potentially decrease this as a possible source for seeding of distant sites as well.   PLAN: I therefore discussed with the patient a possible three-week course of treatment area and we discussed the potential side effects and risks of treatment. In general I would expect for this treatment to be tolerated well, with the primary side effects to monitor for would be GI issues such as nausea or diarrhea. I would not expect major difficulties.  I believe that a 3 week course of treatment would be reasonable to give a good chance for obtaining local control in this area. Such a course of treatment would allow the possibility of additional treatment at a later date if this proved necessary. This would also have a low-risk for developing significant local side effects. The patient's questions were answered and he indicated that he wished to proceed with this. He therefore will be scheduled for a simulation in the near future such that we could proceed with treatment planning. I would anticipate 15 treatments.    I spent 60 minutes minutes face to face with the patient and more than 50% of that time was spent in counseling  and/or coordination of care.    ________________________________   Radene Gunning, MD, PhD

## 2013-02-25 ENCOUNTER — Ambulatory Visit (HOSPITAL_BASED_OUTPATIENT_CLINIC_OR_DEPARTMENT_OTHER): Payer: Medicare Other | Admitting: Nurse Practitioner

## 2013-02-25 VITALS — BP 136/73 | HR 85 | Temp 97.7°F | Resp 18 | Ht 67.0 in | Wt 140.0 lb

## 2013-02-25 DIAGNOSIS — C786 Secondary malignant neoplasm of retroperitoneum and peritoneum: Secondary | ICD-10-CM

## 2013-02-25 DIAGNOSIS — C180 Malignant neoplasm of cecum: Secondary | ICD-10-CM

## 2013-02-25 DIAGNOSIS — C182 Malignant neoplasm of ascending colon: Secondary | ICD-10-CM

## 2013-02-25 NOTE — Patient Instructions (Signed)
Continue to hold Xeloda.

## 2013-02-25 NOTE — Progress Notes (Signed)
OFFICE PROGRESS NOTE  Interval history:  Mr. Dehn returns for a short interval followup visit. To review, he is an 79 year old man with stage IV colon cancer with diffuse peritoneal metastases at time of diagnosis in April 2012. He underwent resection of the primary lesion in the cecum. He was since maintained on single agent oral Xeloda.   CT scan done on 10/09/2012 showed a new area of soft tissue density in the region of previous surgical clips in the right pelvis measured at 2.2 x 2.8 cm. There were no other changes to suggest progression. Xeloda was continued on the same dose and schedule. Followup CT on 02/02/2013 showed the area of concern in the right lower quadrant was more prominent measuring 3.0 x 2.7 cm. No peritoneal nodularity or ascites was noted. Xeloda was placed on hold.  He was referred to Dr. Mitzi Hansen, radiation oncology who felt that Mr. Kemler may benefit from focused radiotherapy to the area of concern. He is scheduled for CT simulation on 02/27/2013 with a 3 week course of treatment planned.  Mr. Dykstra feels well. No nausea or vomiting. Bowels regularly. No abdominal pain. He has a good appetite and good energy level.   Objective: Blood pressure 136/73, pulse 85, temperature 97.7 F (36.5 C), temperature source Oral, resp. rate 18, height 5\' 7"  (1.702 m), weight 140 lb (63.504 kg).  He is hard of hearing. Oropharynx is without thrush or ulceration. No palpable cervical or supraclavicular lymph nodes. Lungs are clear. Irregular cardiac rhythm. 2/6 systolic murmur. Abdomen is soft and nontender. No mass. No organomegaly. He has trace edema at the lower legs bilaterally. 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 Motor strength 5 over 5.  Lab Results: Lab Results  Component Value Date   WBC 7.4 02/02/2013   HGB 11.5* 02/02/2013   HCT 34.8* 02/02/2013   MCV 103.7* 02/02/2013   PLT 154 02/02/2013    Chemistry:    Chemistry      Component Value Date/Time   NA 143 02/02/2013  0929   NA 140 07/17/2012 0919   NA 143 06/05/2012 0822   K 3.7 02/02/2013 0929   K 3.7 07/17/2012 0919   K 3.9 06/05/2012 0822   CL 106 02/02/2013 0929   CL 105 07/17/2012 0919   CL 100 06/05/2012 0822   CO2 27 02/02/2013 0929   CO2 27 07/17/2012 0919   CO2 29 06/05/2012 0822   BUN 14.9 02/02/2013 0929   BUN 14 07/17/2012 0919   BUN 15 06/05/2012 0822   CREATININE 0.9 02/02/2013 0929   CREATININE 0.82 07/17/2012 0919   CREATININE 0.8 06/05/2012 0822      Component Value Date/Time   CALCIUM 10.0 02/02/2013 0929   CALCIUM 9.6 07/17/2012 0919   CALCIUM 9.8 06/05/2012 0822   ALKPHOS 96 02/02/2013 0929   ALKPHOS 82 07/17/2012 0919   ALKPHOS 101* 06/05/2012 0822   AST 23 02/02/2013 0929   AST 22 07/17/2012 0919   AST 41* 06/05/2012 0822   ALT 17 02/02/2013 0929   ALT 14 07/17/2012 0919   BILITOT 1.43* 02/02/2013 0929   BILITOT 1.3* 07/17/2012 0919   BILITOT 1.30 06/05/2012 0822       Studies/Results: Ct Abdomen Pelvis W Contrast  02/02/2013  *RADIOLOGY REPORT*  Clinical Data: Restaging colon cancer diagnosed in April 2012. Chemotherapy ongoing.  History of partial colectomy.  CT ABDOMEN AND PELVIS WITH CONTRAST  Technique:  Multidetector CT imaging of the abdomen and pelvis was performed following the standard protocol  during bolus administration of intravenous contrast.  Contrast: OMNIPAQUE IOHEXOL 300 MG/ML  SOLN  Comparison: Prior examinations 10/09/2012 and 06/05/2012.  Findings: The left greater than right pleural effusions demonstrate slight further enlargement compared with the prior study.  The resulting bibasilar atelectasis is unchanged.  There is stable cardiomegaly and mural thrombus within the distal thoracic aorta.  The liver has a stable appearance without focal lesion. The gallbladder, pancreas, spleen and adrenal glands appear unchanged. Mild renal cortical scarring and low density lesions bilaterally are unchanged.  There is no hydronephrosis.  There has been further enlargement in the low  density mass surrounding the surgical clips in the right lower quadrant.  This now measures 3.0 x 2.7 cm on image 57 (previously 2.2 x 2.2 cm). This remains concerning for a nodal metastasis.  No other enlarged abdominal pelvic lymph nodes are identified.  There is no progressive ascites or peritoneal nodularity.  A small amount of perihepatic ascites is unchanged.  There is no evidence of focal bowel lesion or obstruction.  A broad-based ventral hernia appears unchanged without associated obstruction.  There is extensive aorto iliac atherosclerosis and ectasia.  The distal aorta measures up to 3.1 cm AP.  The bladder and prostate gland appear unchanged.  There is a significant convex right scoliosis.  Chronic dislocation of the right hip and partial resorption of the right femoral head are stable.  No worrisome osseous lesions are identified.  IMPRESSION:  1.  Further enlargement of low density right pelvic mass surrounding the surgical clips, concerning for local recurrence or nodal metastasis. 2.  No other evidence of metastatic disease. 3. Further slight enlargement of bilateral pleural effusions. 4.  Unchanged aortoiliac atherosclerosis, scoliosis, broad-based ventral hernia and chronic dislocation of the right hip.   Original Report Authenticated By: Carey Bullocks, M.D.     Medications: I have reviewed the patient's current medications.  Assessment/Plan:  1. Stage IV colon cancer with peritoneal metastases at diagnosis April 2012. He continues of Xeloda on a 1 week on/1 week off schedule. Restaging CT evaluation 09/24/2011 showed resolution of peritoneal metastases. Restaging CT evaluation 10/09/2012 showed a new small area of soft tissue density in the area of previous surgical clips in the right pelvis measuring approximately 2.2 x 2.8 cm. Scan was otherwise unchanged. Xeloda continued. Followup scan on 02/02/2013 showed the area of concern in the right lower quadrant was more prominent. Xeloda was  placed on hold and he was referred to radiation oncology. 2. Hand-foot syndrome secondary to Xeloda-improved.  3. Coronary artery disease. 4. Valvular heart disease. 5. Cerebrovascular disease status post right carotid endarterectomy. 6. Essential hypertension. 7. Hyperlipidemia. 8. Sensorineural hearing loss. 9. Bilateral pleural effusions, stable on CT scan 06/05/2012. Likely cardiac origin. He is asymptomatic.  Disposition-Charles Faulkner appears well. He remains asymptomatic. Plan to proceed with a course of palliative radiation to the right pelvis under the direction of Dr. Mitzi Hansen. He will continue to hold Xeloda. We will see him in followup in one month.  Plan reviewed with Dr. [name].  Lonna Cobb ANP/GNP-BC

## 2013-02-26 ENCOUNTER — Telehealth: Payer: Self-pay | Admitting: Oncology

## 2013-02-26 NOTE — Telephone Encounter (Signed)
Gave pt's appt to wife for APril 30th lab and ML

## 2013-02-27 ENCOUNTER — Encounter (INDEPENDENT_AMBULATORY_CARE_PROVIDER_SITE_OTHER): Payer: Self-pay | Admitting: General Surgery

## 2013-02-27 ENCOUNTER — Telehealth: Payer: Self-pay | Admitting: Radiation Oncology

## 2013-02-27 ENCOUNTER — Ambulatory Visit (INDEPENDENT_AMBULATORY_CARE_PROVIDER_SITE_OTHER): Payer: Medicare Other | Admitting: General Surgery

## 2013-02-27 ENCOUNTER — Ambulatory Visit
Admission: RE | Admit: 2013-02-27 | Discharge: 2013-02-27 | Disposition: A | Discharge status: Home / Self Care | Payer: Medicare Other | Source: Ambulatory Visit | Attending: Radiation Oncology | Admitting: Radiation Oncology

## 2013-02-27 VITALS — BP 142/80 | HR 92 | Temp 97.8°F | Resp 18 | Ht 67.0 in | Wt 139.0 lb

## 2013-02-27 DIAGNOSIS — Z510 Encounter for antineoplastic radiation therapy: Secondary | ICD-10-CM | POA: Insufficient documentation

## 2013-02-27 DIAGNOSIS — C180 Malignant neoplasm of cecum: Secondary | ICD-10-CM | POA: Insufficient documentation

## 2013-02-27 DIAGNOSIS — C786 Secondary malignant neoplasm of retroperitoneum and peritoneum: Secondary | ICD-10-CM | POA: Insufficient documentation

## 2013-02-27 DIAGNOSIS — C182 Malignant neoplasm of ascending colon: Secondary | ICD-10-CM

## 2013-02-27 DIAGNOSIS — Z79899 Other long term (current) drug therapy: Secondary | ICD-10-CM | POA: Insufficient documentation

## 2013-02-27 NOTE — Progress Notes (Signed)
HISTORY: Patient is now approximately 2 years status post right hemicolectomy for stage for obstructing colon cancer.   He has done surprisingly well. He has been maintained on Xeloda on and off since his surgery because of peritoneal metastases.  His recent CT scan demonstrated a mass in the right pelvis. This is small, but is in the location of clips where I was concerned about recurrence. He denies pelvic pain. He is eating well and not having any abdominal pain. He is maintaining his weight. He has not had any hand-foot syndrome or diarrhea with the capecitabine.  Dr. Mitzi Hansen is going to set up radiation for the pelvic mass.  He has also not had any discomfort with the ventral hernia.  PERTINENT REVIEW OF SYSTEMS: Otherwise negative.     EXAM: Head: Normocephalic and atraumatic.  Eyes:  Conjunctivae are normal. Pupils are equal, round, and reactive to light. No scleral icterus.  Neck:  Normal range of motion. Neck supple. No tracheal deviation present. No thyromegaly present.  Resp: No respiratory distress, normal effort. Abd:  Abdomen is soft, non distended and non tender. No masses are palpable.  There is no rebound and no guarding.   Large ventral hernia present.   Neurological: Alert and oriented to person, place, and time. Coordination impaired.  Uses walker.    Skin: Skin is warm and dry. No rash noted. No diaphoretic. No erythema. No pallor.  Psychiatric: Normal mood and affect. Normal behavior. Judgment and thought content normal.      ASSESSMENT AND PLAN:   Stage IV cancer of cecum T4b N2b M1, peritoneal mets Patient continues to do clinically well.  He has a recurrence in the pelvis that is going to be treated with radiation.  Follow up with me in 6 months.  I will let Dr. [name] to be in charge of ordering surveillance scans and labs.     Maudry Diego, MD Surgical Oncology, General & Endocrine Surgery [facility]  [name].,  MD Elfredia Nevins, MD

## 2013-02-27 NOTE — Assessment & Plan Note (Signed)
Patient continues to do clinically well.  He has a recurrence in the pelvis that is going to be treated with radiation.  Follow up with me in 6 months.  I will let Dr. [name] to be in charge of ordering surveillance scans and labs.

## 2013-02-27 NOTE — Patient Instructions (Signed)
Get radiation simulation today and radiation next week.  Dr. [name] will be in charge in of ordering follow up scans.

## 2013-02-27 NOTE — Telephone Encounter (Signed)
Met w patient to discuss RO billing. Pt had no financial concerns today.  Dx: Cancer of right colon - Primary 153.6  Attending Rad:  JM   Rad Tx:  Daily

## 2013-02-27 NOTE — Progress Notes (Signed)
  Radiation Oncology         [phone] ________________________________  Name: MARIEL GAUDIN MRN: [id number]  Date: 02/27/2013  DOB: [birth year]  SIMULATION AND TREATMENT PLANNING NOTE  DIAGNOSIS:  Colorectal cancer  NARRATIVE:  The patient was brought to the CT Simulation planning suite.  Identity was confirmed.  All relevant records and images related to the planned course of therapy were reviewed.   Written consent to proceed with treatment was confirmed which was freely given after reviewing the details related to the planned course of therapy had been reviewed with the patient.  Then, the patient was set-up in a stable reproducible  supine position for radiation therapy.  CT images were obtained.  Surface markings were placed.   A customized VAC lock bag was constructed to help with the patient immobilization and each day.  The CT images were loaded into the planning software.  Then the target and avoidance structures were contoured.  Treatment planning then occurred.  The radiation prescription was entered and confirmed.  A total of 5 complex treatment devices were fabricated which relate to the designed radiation treatment fields. Each of these customized fields/ complex treatment devices will be used on a daily basis during the radiation course. I have requested : 3D Simulation  I have requested a DVH of the following structures: Femoral heads, bowel, rectum.   PLAN:  The patient will receive 37.5 Gy in 15 fractions.  ________________________________   Radene Gunning, MD, PhD

## 2013-03-06 ENCOUNTER — Ambulatory Visit
Admission: RE | Admit: 2013-03-06 | Discharge: 2013-03-06 | Disposition: A | Discharge status: Home / Self Care | Payer: Medicare Other | Source: Ambulatory Visit | Attending: Radiation Oncology | Admitting: Radiation Oncology

## 2013-03-06 DIAGNOSIS — C182 Malignant neoplasm of ascending colon: Secondary | ICD-10-CM

## 2013-03-07 NOTE — Progress Notes (Signed)
  Radiation Oncology         [phone] ________________________________  Name: [name] MRN: [id number]  Date: 03/06/2013  DOB: [birth year]  Simulation Verification Note   NARRATIVE: The patient was brought to the treatment unit and placed in the planned treatment position. The clinical setup was verified. Then port films were obtained and uploaded to the radiation oncology medical record software.  The treatment beams were carefully compared against the planned radiation fields. The position, location, and shape of the radiation fields was reviewed. The targeted volume of tissue appears to be appropriately covered by the radiation beams. Based on my personal review, I approved the simulation verification. The patient's treatment will proceed as planned.  ________________________________   Radene Gunning, MD, PhD

## 2013-03-09 ENCOUNTER — Ambulatory Visit
Admission: RE | Admit: 2013-03-09 | Discharge: 2013-03-09 | Disposition: A | Discharge status: Home / Self Care | Payer: Medicare Other | Source: Ambulatory Visit | Attending: Radiation Oncology | Admitting: Radiation Oncology

## 2013-03-10 ENCOUNTER — Ambulatory Visit
Admission: RE | Admit: 2013-03-10 | Discharge: 2013-03-10 | Disposition: A | Discharge status: Home / Self Care | Payer: Medicare Other | Source: Ambulatory Visit | Attending: Radiation Oncology | Admitting: Radiation Oncology

## 2013-03-11 ENCOUNTER — Ambulatory Visit
Admission: RE | Admit: 2013-03-11 | Discharge: 2013-03-11 | Disposition: A | Discharge status: Home / Self Care | Payer: Medicare Other | Source: Ambulatory Visit | Attending: Radiation Oncology | Admitting: Radiation Oncology

## 2013-03-12 ENCOUNTER — Ambulatory Visit
Admission: RE | Admit: 2013-03-12 | Discharge: 2013-03-12 | Disposition: A | Discharge status: Home / Self Care | Payer: Medicare Other | Source: Ambulatory Visit | Attending: Radiation Oncology | Admitting: Radiation Oncology

## 2013-03-13 ENCOUNTER — Ambulatory Visit
Admission: RE | Admit: 2013-03-13 | Discharge: 2013-03-13 | Disposition: A | Discharge status: Home / Self Care | Payer: Medicare Other | Source: Ambulatory Visit | Attending: Radiation Oncology | Admitting: Radiation Oncology

## 2013-03-13 VITALS — BP 118/51 | HR 75 | Temp 97.6°F | Wt 137.4 lb

## 2013-03-13 DIAGNOSIS — C182 Malignant neoplasm of ascending colon: Secondary | ICD-10-CM

## 2013-03-13 NOTE — Progress Notes (Signed)
Patient and wife here for weekly assessment of radiation to right pelvis for colon cancer.Complted 5 of 15 treatments.Reviewed routine of clinic and side effects of treatment to include bowel and bladder changes, discomfort fatigue.Patient doing well thus far.Denies or any concerns.Appetite good and no difficulty sleeping at night.

## 2013-03-13 NOTE — Progress Notes (Signed)
   Department of Radiation Oncology  Phone:  [phone] Fax:        [phone]  Weekly Treatment Note    Name: Charles Faulkner Date: 03/13/2013 MRN: [id number] DOB: March 21, 1926   Current dose: 12.5 Gy  Current fraction: 5   MEDICATIONS: Current Outpatient Prescriptions  Medication Sig Dispense Refill  . clopidogrel (PLAVIX) 75 MG tablet Take 75 mg by mouth daily.        . digoxin (LANOXIN) 0.125 MG tablet 0.125 mg Daily.      . hydrochlorothiazide 25 MG tablet Take 0.5 tablets by mouth daily.      . lansoprazole (PREVACID) 15 MG capsule Take 15 mg by mouth daily.        Marland Kitchen LIPITOR 40 MG tablet Take 1 tablet by mouth daily.      . Multiple Vitamins-Minerals ([employer] SPECTRAVITE SENIOR PO) Take 1 tablet by mouth daily.        . niacin (NIASPAN) 500 MG CR tablet Take 500 mg by mouth at bedtime. 3 tabs daily      . polyethylene glycol powder (GLYCOLAX/MIRALAX) powder as needed.      . potassium chloride SA (KLOR-CON M20) 20 MEQ tablet Take 20 mEq by mouth daily.       . TENORMIN 50 MG tablet Take 25 mg by mouth 2 (two) times daily.       Marland Kitchen ZETIA 10 MG tablet Take 1 tablet by mouth daily.      . capecitabine (XELODA) 500 MG tablet take 1500mg  = 3 tabs po after breakfast & 2 tabs = 1000mg  after evening meal for 7days on , 7 days off. [facility] - [phone]  70 tablet  3  . ondansetron (ZOFRAN-ODT) 4 MG disintegrating tablet Take 1 tablet twice daily prior to chemotherapy 7 days on/7 days off.  60 tablet  5   No current facility-administered medications for this encounter.     ALLERGIES: Metoprolol tartrate   LABORATORY DATA:  Lab Results  Component Value Date   WBC 7.4 02/02/2013   HGB 11.5* 02/02/2013   HCT 34.8* 02/02/2013   MCV 103.7* 02/02/2013   PLT 154 02/02/2013   Lab Results  Component Value Date   NA 143 02/02/2013   K 3.7 02/02/2013   CL 106 02/02/2013   CO2 27 02/02/2013   Lab Results  Component Value Date   ALT 17 02/02/2013   AST 23 02/02/2013   ALKPHOS 96  02/02/2013   BILITOT 1.43* 02/02/2013     NARRATIVE: Charles Faulkner was seen today for weekly treatment management. The chart was checked and the patient's films were reviewed. The patient is doing very well. No problems at all with treatment so far. No GI issues.  PHYSICAL EXAMINATION: weight is 137 lb 6.4 oz (62.324 kg). His temperature is 97.6 F (36.4 C). His blood pressure is 118/51 and his pulse is 75. His oxygen saturation is 99%.        ASSESSMENT: The patient is doing satisfactorily with treatment.  PLAN: We will continue with the patient's radiation treatment as planned.

## 2013-03-16 ENCOUNTER — Ambulatory Visit
Admission: RE | Admit: 2013-03-16 | Discharge: 2013-03-16 | Disposition: A | Discharge status: Home / Self Care | Payer: Medicare Other | Source: Ambulatory Visit | Attending: Radiation Oncology | Admitting: Radiation Oncology

## 2013-03-17 ENCOUNTER — Ambulatory Visit
Admission: RE | Admit: 2013-03-17 | Discharge: 2013-03-17 | Disposition: A | Discharge status: Home / Self Care | Payer: Medicare Other | Source: Ambulatory Visit | Attending: Radiation Oncology | Admitting: Radiation Oncology

## 2013-03-18 ENCOUNTER — Ambulatory Visit
Admission: RE | Admit: 2013-03-18 | Discharge: 2013-03-18 | Disposition: A | Discharge status: Home / Self Care | Payer: Medicare Other | Source: Ambulatory Visit | Attending: Radiation Oncology | Admitting: Radiation Oncology

## 2013-03-18 ENCOUNTER — Other Ambulatory Visit: Payer: Self-pay | Admitting: Oncology

## 2013-03-18 DIAGNOSIS — C182 Malignant neoplasm of ascending colon: Secondary | ICD-10-CM

## 2013-03-19 ENCOUNTER — Ambulatory Visit
Admission: RE | Admit: 2013-03-19 | Discharge: 2013-03-19 | Disposition: A | Discharge status: Home / Self Care | Payer: Medicare Other | Source: Ambulatory Visit | Attending: Radiation Oncology | Admitting: Radiation Oncology

## 2013-03-20 ENCOUNTER — Ambulatory Visit
Admission: RE | Admit: 2013-03-20 | Discharge: 2013-03-20 | Disposition: A | Discharge status: Home / Self Care | Payer: Medicare Other | Source: Ambulatory Visit | Attending: Radiation Oncology | Admitting: Radiation Oncology

## 2013-03-20 VITALS — BP 132/63 | HR 87 | Temp 97.8°F | Wt 135.6 lb

## 2013-03-20 DIAGNOSIS — C182 Malignant neoplasm of ascending colon: Secondary | ICD-10-CM

## 2013-03-20 NOTE — Progress Notes (Signed)
   Department of Radiation Oncology  Phone:  [phone] Fax:        [phone]  Weekly Treatment Note    Name: [name] Date: 03/20/2013 MRN: [id number] DOB: [birth year]   Current dose: 25 Gy  Current fraction: 10   MEDICATIONS: Current Outpatient Prescriptions  Medication Sig Dispense Refill  . capecitabine (XELODA) 500 MG tablet take 1500mg  = 3 tabs po after breakfast & 2 tabs = 1000mg  after evening meal for 7days on , 7 days off. [facility] - [phone]  70 tablet  3  . clopidogrel (PLAVIX) 75 MG tablet Take 75 mg by mouth daily.        . digoxin (LANOXIN) 0.125 MG tablet 0.125 mg Daily.      . hydrochlorothiazide 25 MG tablet Take 0.5 tablets by mouth daily.      Marland Kitchen KLOR-CON M20 20 MEQ tablet TAKE 1 TABLET TWICE DAILY FOR 5 DAYS,THEN TAKE 1 TABLET EVERY DAY THERE AFTER  50 tablet  0  . lansoprazole (PREVACID) 15 MG capsule Take 15 mg by mouth daily.        Marland Kitchen LIPITOR 40 MG tablet Take 1 tablet by mouth daily.      . Multiple Vitamins-Minerals ([employer] SPECTRAVITE SENIOR PO) Take 1 tablet by mouth daily.        . niacin (NIASPAN) 500 MG CR tablet Take 500 mg by mouth at bedtime. 3 tabs daily      . ondansetron (ZOFRAN-ODT) 4 MG disintegrating tablet Take 1 tablet twice daily prior to chemotherapy 7 days on/7 days off.  60 tablet  5  . polyethylene glycol powder (GLYCOLAX/MIRALAX) powder as needed.      . potassium chloride SA (KLOR-CON M20) 20 MEQ tablet Take 20 mEq by mouth daily.       . TENORMIN 50 MG tablet Take 25 mg by mouth 2 (two) times daily.       Marland Kitchen ZETIA 10 MG tablet Take 1 tablet by mouth daily.       No current facility-administered medications for this encounter.     ALLERGIES: Metoprolol tartrate   LABORATORY DATA:  Lab Results  Component Value Date   WBC 7.4 02/02/2013   HGB 11.5* 02/02/2013   HCT 34.8* 02/02/2013   MCV 103.7* 02/02/2013   PLT 154 02/02/2013   Lab Results  Component Value Date   NA 143 02/02/2013   K 3.7 02/02/2013   CL 106  02/02/2013   CO2 27 02/02/2013   Lab Results  Component Value Date   ALT 17 02/02/2013   AST 23 02/02/2013   ALKPHOS 96 02/02/2013   BILITOT 1.43* 02/02/2013     NARRATIVE: Charles Faulkner was seen today for weekly treatment management. The chart was checked and the patient's films were reviewed. The patient is doing well. He has had a little bit of loose stools but no diarrhea. No nausea or other complaints.  PHYSICAL EXAMINATION: weight is 135 lb 9.6 oz (61.508 kg). His temperature is 97.8 F (36.6 C). His blood pressure is 132/63 and his pulse is 87. His oxygen saturation is 100%.        ASSESSMENT: The patient is doing satisfactorily with treatment.  PLAN: We will continue with the patient's radiation treatment as planned.

## 2013-03-20 NOTE — Progress Notes (Signed)
Patient for weekly assessment of radiation to right pelvis.Doing well.Denies pain.Completed 10 of 15.

## 2013-03-23 ENCOUNTER — Ambulatory Visit
Admission: RE | Admit: 2013-03-23 | Discharge: 2013-03-23 | Disposition: A | Discharge status: Home / Self Care | Payer: Medicare Other | Source: Ambulatory Visit | Attending: Radiation Oncology | Admitting: Radiation Oncology

## 2013-03-24 ENCOUNTER — Telehealth: Payer: Self-pay | Admitting: Oncology

## 2013-03-24 ENCOUNTER — Ambulatory Visit
Admission: RE | Admit: 2013-03-24 | Discharge: 2013-03-24 | Disposition: A | Discharge status: Home / Self Care | Payer: Medicare Other | Source: Ambulatory Visit | Attending: Radiation Oncology | Admitting: Radiation Oncology

## 2013-03-25 ENCOUNTER — Other Ambulatory Visit: Payer: Medicare Other | Admitting: Lab

## 2013-03-25 ENCOUNTER — Ambulatory Visit
Admission: RE | Admit: 2013-03-25 | Discharge: 2013-03-25 | Disposition: A | Discharge status: Home / Self Care | Payer: Medicare Other | Source: Ambulatory Visit | Attending: Radiation Oncology | Admitting: Radiation Oncology

## 2013-03-25 ENCOUNTER — Ambulatory Visit: Payer: Medicare Other | Admitting: Nurse Practitioner

## 2013-03-26 ENCOUNTER — Ambulatory Visit
Admission: RE | Admit: 2013-03-26 | Discharge: 2013-03-26 | Disposition: A | Discharge status: Home / Self Care | Payer: Medicare Other | Source: Ambulatory Visit | Attending: Radiation Oncology | Admitting: Radiation Oncology

## 2013-03-27 ENCOUNTER — Encounter: Payer: Self-pay | Admitting: Radiation Oncology

## 2013-03-27 ENCOUNTER — Ambulatory Visit
Admission: RE | Admit: 2013-03-27 | Discharge: 2013-03-27 | Disposition: A | Discharge status: Home / Self Care | Payer: Medicare Other | Source: Ambulatory Visit | Attending: Radiation Oncology | Admitting: Radiation Oncology

## 2013-03-27 VITALS — BP 126/95 | HR 81 | Temp 98.0°F | Wt 135.1 lb

## 2013-03-27 DIAGNOSIS — C182 Malignant neoplasm of ascending colon: Secondary | ICD-10-CM

## 2013-03-27 NOTE — Progress Notes (Signed)
  Radiation Oncology         [phone] ________________________________  Name: Charles Faulkner MRN: [id number]  Date: 03/27/2013  DOB: 1926/09/30  Weekly Radiation Therapy Management  Current Dose: 37.5 Gy     Planned Dose:  37.5 Gy  Narrative . . . . . . . . The patient presents for the final under treatment assessment.                                            The patient has had some pelvic pain which was self limited.  Per Nursing:  Patient here for weekly assessment and completion of radiation to right pelvis for stage IV metastatic cecum cancer.Patient took xeloda for 2 years.Bowels soft, no diarrhea.Denies pain.One month follow up scheduled                                 Set-up films were reviewed.                                 The chart was checked. Physical Findings. . . Weight essentially stable.  No significant changes. Impression . . . . . . . The patient tolerated radiation relatively well. Plan . . . . . . . . . . . . Complete radiation today as scheduled, and follow-up with Dr. Mitzi Hansen in one month. The patient was encouraged to call or return to the clinic in the interim for any worsening symptoms.  ________________________________  Artist Pais Kathrynn Running, M.D.

## 2013-03-27 NOTE — Progress Notes (Signed)
Patient here for weekly assessment and completion of radiation to right pelvis for stage IV metastatic cecum cancer.Patient took xeloda for 2 years.Bowels soft, no diarrhea.Denies pain.One month follow up scheduled.

## 2013-04-01 ENCOUNTER — Other Ambulatory Visit (HOSPITAL_BASED_OUTPATIENT_CLINIC_OR_DEPARTMENT_OTHER): Payer: Medicare Other | Admitting: Lab

## 2013-04-01 ENCOUNTER — Telehealth: Payer: Self-pay | Admitting: Oncology

## 2013-04-01 ENCOUNTER — Ambulatory Visit (HOSPITAL_BASED_OUTPATIENT_CLINIC_OR_DEPARTMENT_OTHER): Payer: Medicare Other | Admitting: Nurse Practitioner

## 2013-04-01 VITALS — BP 133/53 | HR 76 | Temp 97.0°F | Resp 17 | Ht 67.0 in | Wt 131.1 lb

## 2013-04-01 DIAGNOSIS — C182 Malignant neoplasm of ascending colon: Secondary | ICD-10-CM

## 2013-04-01 LAB — COMPREHENSIVE METABOLIC PANEL (CC13)
Alkaline Phosphatase: 82 U/L (ref 40–150)
Glucose: 115 mg/dl — ABNORMAL HIGH (ref 70–99)
Sodium: 142 mEq/L (ref 136–145)
Total Bilirubin: 0.69 mg/dL (ref 0.20–1.20)
Total Protein: 6.2 g/dL — ABNORMAL LOW (ref 6.4–8.3)

## 2013-04-01 LAB — CBC WITH DIFFERENTIAL/PLATELET
EOS%: 1.2 % (ref 0.0–7.0)
LYMPH%: 6.8 % — ABNORMAL LOW (ref 14.0–49.0)
MCH: 33.5 pg — ABNORMAL HIGH (ref 27.2–33.4)
MCV: 100.7 fL — ABNORMAL HIGH (ref 79.3–98.0)
MONO%: 12.5 % (ref 0.0–14.0)
Platelets: 154 10*3/uL (ref 140–400)
RBC: 3.65 10*6/uL — ABNORMAL LOW (ref 4.20–5.82)
RDW: 16.5 % — ABNORMAL HIGH (ref 11.0–14.6)

## 2013-04-01 NOTE — Progress Notes (Signed)
OFFICE PROGRESS NOTE  Interval history:  Charles Faulkner is an 79 year old man with stage IV colon cancer with diffuse peritoneal metastases at time of diagnosis in April 2012. He underwent resection of the primary lesion in the cecum. He was since maintained on single agent oral Xeloda.   CT scan done on 10/09/2012 showed a new area of soft tissue density in the region of previous surgical clips in the right pelvis measured at 2.2 x 2.8 cm. There were no other changes to suggest progression. Xeloda was continued on the same dose and schedule. Followup CT on 02/02/2013 showed the area of concern in the right lower quadrant was more prominent measuring 3.0 x 2.7 cm. No peritoneal nodularity or ascites was noted. Xeloda was placed on hold.  He completed the course of radiation to the right pelvis on 03/27/2013. He had some diarrhea during the course of radiation which has resolved. He lost some weight. He continues to have a good appetite. He denies abdominal pain. One week ago he heard a "pop" and felt pain at right lower back. The pain resolved after 3 hours. He thinks he cracked a rib. There was no associated shortness of breath.   Objective: Blood pressure 133/53, pulse 76, temperature 97 F (36.1 C), temperature source Oral, resp. rate 17, height 5\' 7"  (1.702 m), weight 131 lb 1.6 oz (59.467 kg).  No thrush or ulcerations. Lungs are clear. Irregular cardiac rhythm. 2/6 systolic murmur. Abdomen is soft and nontender. No mass. No organomegaly. Trace edema at the lower legs. 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 Motor strength 5 over 5. Hard of hearing.  Lab Results: Lab Results  Component Value Date   WBC 8.2 04/01/2013   HGB 12.2* 04/01/2013   HCT 36.8* 04/01/2013   MCV 100.7* 04/01/2013   PLT 154 04/01/2013    Chemistry:    Chemistry      Component Value Date/Time   NA 143 02/02/2013 0929   NA 140 07/17/2012 0919   NA 143 06/05/2012 0822   K 3.7 02/02/2013 0929   K 3.7 07/17/2012 0919   K 3.9  06/05/2012 0822   CL 106 02/02/2013 0929   CL 105 07/17/2012 0919   CL 100 06/05/2012 0822   CO2 27 02/02/2013 0929   CO2 27 07/17/2012 0919   CO2 29 06/05/2012 0822   BUN 14.9 02/02/2013 0929   BUN 14 07/17/2012 0919   BUN 15 06/05/2012 0822   CREATININE 0.9 02/02/2013 0929   CREATININE 0.82 07/17/2012 0919   CREATININE 0.8 06/05/2012 0822      Component Value Date/Time   CALCIUM 10.0 02/02/2013 0929   CALCIUM 9.6 07/17/2012 0919   CALCIUM 9.8 06/05/2012 0822   ALKPHOS 96 02/02/2013 0929   ALKPHOS 82 07/17/2012 0919   ALKPHOS 101* 06/05/2012 0822   AST 23 02/02/2013 0929   AST 22 07/17/2012 0919   AST 41* 06/05/2012 0822   ALT 17 02/02/2013 0929   ALT 14 07/17/2012 0919   BILITOT 1.43* 02/02/2013 0929   BILITOT 1.3* 07/17/2012 0919   BILITOT 1.30 06/05/2012 0822       Studies/Results: No results found.  Medications: I have reviewed the patient's current medications.  Assessment/Plan:  1. Stage IV colon cancer with peritoneal metastases at diagnosis April 2012. He continues of Xeloda on a 1 week on/1 week off schedule. Restaging CT evaluation 09/24/2011 showed resolution of peritoneal metastases. Restaging CT evaluation 10/09/2012 showed a new small area of soft tissue density in the  area of previous surgical clips in the right pelvis measuring approximately 2.2 x 2.8 cm. Scan was otherwise unchanged. Xeloda continued. Followup scan on 02/02/2013 showed the area of concern in the right lower quadrant was more prominent. Xeloda was placed on hold. He completed a course of radiation therapy to the right pelvis on 03/27/2013. 2. Hand-foot syndrome secondary to Xeloda-improved.  3. Coronary artery disease. 4. Valvular heart disease. 5. Cerebrovascular disease status post right carotid endarterectomy. 6. Essential hypertension. 7. Hyperlipidemia. 8. Sensorineural hearing loss. 9. Bilateral pleural effusions, stable on CT scan 06/05/2012. Likely cardiac origin. He is asymptomatic.  Disposition-[name] appears well. He remains asymptomatic from the colon cancer. Dr. [name] recommends following on an observation approach with labs and repeat CT scans in approximately 8 weeks. He will return for a followup visit with Dr. [name] 1 week after the scans to review the results. He will contact the office in the interim with any problems.  Plan reviewed with Dr. [name].  Lonna Cobb ANP/GNP-BC

## 2013-04-02 NOTE — Progress Notes (Signed)
  Radiation Oncology         ([phone]) [phone] ________________________________  Name: Charles Faulkner MRN: [id number]  Date: 03/27/2013  DOB: 1926-07-11  End of Treatment Note  Diagnosis:   Metastatic colon cancer     Indication for treatment:  Palliative       Radiation treatment dates:   03/09/2013 through 03/27/2013  Site/dose:   The patient was treated to a metastasis within the right pelvis. He was treated to 37.5 gray at 2.5 gray per fraction using a 5 field technique. Daily image guidance was used in this consisted of a 3-D conformal technique.  Narrative: The patient tolerated radiation treatment relatively well.   The patient did not have any acute toxicity, no GI complaints during treatment.  Plan: The patient has completed radiation treatment. The patient will return to radiation oncology clinic for routine followup in one month. I advised the patient to call or return sooner if they have any questions or concerns related to their recovery or treatment. ________________________________  Radene Gunning, M.D., Ph.D.

## 2013-04-28 ENCOUNTER — Encounter: Payer: Self-pay | Admitting: Oncology

## 2013-04-29 ENCOUNTER — Ambulatory Visit
Admission: RE | Admit: 2013-04-29 | Discharge: 2013-04-29 | Disposition: A | Discharge status: Home / Self Care | Payer: Medicare Other | Source: Ambulatory Visit | Attending: Radiation Oncology | Admitting: Radiation Oncology

## 2013-04-29 VITALS — BP 154/54 | HR 84 | Temp 98.4°F | Ht 67.0 in | Wt 137.4 lb

## 2013-04-29 DIAGNOSIS — C182 Malignant neoplasm of ascending colon: Secondary | ICD-10-CM

## 2013-04-29 NOTE — Progress Notes (Signed)
Radiation Oncology         [phone]) [phone] ________________________________  Name: Charles Faulkner MRN: [id number]  Date: 04/29/2013  DOB: 12-Dec-1925  Follow-Up Visit Note  CC: [name]., MD  Elfredia Nevins, MD  Diagnosis:   Metastatic colon cancer  Interval Since Last Radiation:  One month   Narrative:  The patient returns today for routine follow-up.  The patient completed his course of palliative radiotherapy to the pelvis on 03/27/2013. The patient has done well overall since he finished treatment. He is not having any difficulty with diarrhea. No nausea. The patient did have some rib pain several weeks ago and he felt that he may have broken a rib. However this pain has resolved.                              ALLERGIES:  is allergic to metoprolol tartrate.  Meds: Current Outpatient Prescriptions  Medication Sig Dispense Refill  . clopidogrel (PLAVIX) 75 MG tablet Take 75 mg by mouth daily.        . digoxin (LANOXIN) 0.125 MG tablet 0.125 mg Daily.      . hydrochlorothiazide 25 MG tablet Take 0.5 tablets by mouth daily.      . lansoprazole (PREVACID) 15 MG capsule Take 15 mg by mouth daily.        Marland Kitchen LIPITOR 40 MG tablet Take 1 tablet by mouth daily.      . Multiple Vitamins-Minerals ([employer] SPECTRAVITE SENIOR PO) Take 1 tablet by mouth daily.        . niacin (NIASPAN) 500 MG CR tablet Take 500 mg by mouth at bedtime. 3 tabs daily      . polyethylene glycol powder (GLYCOLAX/MIRALAX) powder as needed.      . potassium chloride SA (KLOR-CON M20) 20 MEQ tablet Take 20 mEq by mouth daily.       . TENORMIN 50 MG tablet Take 25 mg by mouth 2 (two) times daily.       Marland Kitchen ZETIA 10 MG tablet Take 1 tablet by mouth daily.      . capecitabine (XELODA) 500 MG tablet take 1500mg  = 3 tabs po after breakfast & 2 tabs = 1000mg  after evening meal for 7days on , 7 days off. Biologics - [phone]  70 tablet  3  . ondansetron (ZOFRAN-ODT) 4 MG disintegrating tablet Take 1 tablet twice daily prior  to chemotherapy 7 days on/7 days off.  60 tablet  5   No current facility-administered medications for this encounter.    Physical Findings: The patient is in no acute distress. Patient is alert and oriented.  height is 5\' 7"  (1.702 m) and weight is 137 lb 6.4 oz (62.324 kg). His temperature is 98.4 F (36.9 C). His blood pressure is 154/54 and his pulse is 84. .   General: Well-developed, in no acute distress HEENT: Normocephalic, atraumatic Cardiovascular: Regular rate and rhythm Respiratory: Clear to auscultation bilaterally GI: Soft, nontender, normal bowel sounds Extremities: No edema present; no rib pain/tenderness   Lab Findings: Lab Results  Component Value Date   WBC 8.2 04/01/2013   HGB 12.2* 04/01/2013   HCT 36.8* 04/01/2013   MCV 100.7* 04/01/2013   PLT 154 04/01/2013     Radiographic Findings: No results found.  Impression:    The patient is doing well 1 month after completing palliative radiotherapy to the right pelvis for his metastatic colon cancer. No symptoms related to  his treatment.  Plan:  He has repeat CT scans scheduled in approximately one month, with followup with Dr. [name] after this. If there is no current role for radiation treatment. We will have him return to our clinic on a when necessary basis.   Radene Gunning, M.D., Ph.D.

## 2013-04-29 NOTE — Progress Notes (Signed)
Charles Faulkner and his wife here for 1 month follow up. He is using a walker for ambulation.  He is alert and oriented to person, place and time.  He has had 37.5 gray to his right pelvis.  He denies pain, diarrhea, urinary frequency and nausea.  He does have fatigue.  He states he has to have a bowel movement after he eats due to his colon surgery.  His right upper and lower eyelid is red.  He states it runs all the time and his lower lid droops down.

## 2013-04-30 ENCOUNTER — Ambulatory Visit: Payer: Medicare Other | Admitting: Radiation Oncology

## 2013-05-11 ENCOUNTER — Other Ambulatory Visit: Payer: Self-pay | Admitting: Oncology

## 2013-05-11 DIAGNOSIS — C182 Malignant neoplasm of ascending colon: Secondary | ICD-10-CM

## 2013-05-25 ENCOUNTER — Telehealth: Payer: Self-pay | Admitting: Oncology

## 2013-05-25 ENCOUNTER — Other Ambulatory Visit (HOSPITAL_BASED_OUTPATIENT_CLINIC_OR_DEPARTMENT_OTHER): Payer: Medicare Other

## 2013-05-25 ENCOUNTER — Ambulatory Visit (HOSPITAL_COMMUNITY)
Admission: RE | Admit: 2013-05-25 | Discharge: 2013-05-25 | Disposition: A | Discharge status: Home / Self Care | Payer: Medicare Other | Source: Ambulatory Visit | Attending: Nurse Practitioner | Admitting: Nurse Practitioner

## 2013-05-25 ENCOUNTER — Encounter (HOSPITAL_COMMUNITY): Payer: Self-pay

## 2013-05-25 DIAGNOSIS — I714 Abdominal aortic aneurysm, without rupture, unspecified: Secondary | ICD-10-CM | POA: Insufficient documentation

## 2013-05-25 DIAGNOSIS — N281 Cyst of kidney, acquired: Secondary | ICD-10-CM | POA: Insufficient documentation

## 2013-05-25 DIAGNOSIS — R1084 Generalized abdominal pain: Secondary | ICD-10-CM | POA: Insufficient documentation

## 2013-05-25 DIAGNOSIS — J90 Pleural effusion, not elsewhere classified: Secondary | ICD-10-CM | POA: Insufficient documentation

## 2013-05-25 DIAGNOSIS — M24459 Recurrent dislocation, unspecified hip: Secondary | ICD-10-CM | POA: Insufficient documentation

## 2013-05-25 DIAGNOSIS — C182 Malignant neoplasm of ascending colon: Secondary | ICD-10-CM | POA: Insufficient documentation

## 2013-05-25 DIAGNOSIS — Z923 Personal history of irradiation: Secondary | ICD-10-CM | POA: Insufficient documentation

## 2013-05-25 DIAGNOSIS — I517 Cardiomegaly: Secondary | ICD-10-CM | POA: Insufficient documentation

## 2013-05-25 DIAGNOSIS — Z9221 Personal history of antineoplastic chemotherapy: Secondary | ICD-10-CM | POA: Insufficient documentation

## 2013-05-25 DIAGNOSIS — C180 Malignant neoplasm of cecum: Secondary | ICD-10-CM

## 2013-05-25 LAB — CBC WITH DIFFERENTIAL/PLATELET
Basophils Absolute: 0 10*3/uL (ref 0.0–0.1)
Eosinophils Absolute: 0.1 10*3/uL (ref 0.0–0.5)
HCT: 37.9 % — ABNORMAL LOW (ref 38.4–49.9)
HGB: 13.1 g/dL (ref 13.0–17.1)
LYMPH%: 14.9 % (ref 14.0–49.0)
MCV: 99.3 fL — ABNORMAL HIGH (ref 79.3–98.0)
MONO%: 8 % (ref 0.0–14.0)
NEUT#: 4.7 10*3/uL (ref 1.5–6.5)
NEUT%: 75.6 % — ABNORMAL HIGH (ref 39.0–75.0)
Platelets: 157 10*3/uL (ref 140–400)
RBC: 3.82 10*6/uL — ABNORMAL LOW (ref 4.20–5.82)

## 2013-05-25 LAB — COMPREHENSIVE METABOLIC PANEL (CC13)
Albumin: 3.7 g/dL (ref 3.5–5.0)
BUN: 12 mg/dL (ref 7.0–26.0)
CO2: 28 mEq/L (ref 22–29)
Calcium: 10.1 mg/dL (ref 8.4–10.4)
Chloride: 104 mEq/L (ref 98–109)
Creatinine: 0.9 mg/dL (ref 0.7–1.3)
Glucose: 127 mg/dl (ref 70–140)

## 2013-05-25 MED ORDER — IOHEXOL 300 MG/ML  SOLN
100.0000 mL | Freq: Once | INTRAMUSCULAR | Status: AC | PRN
  Administered 2013-05-25: 100 mL via INTRAVENOUS

## 2013-05-25 NOTE — Telephone Encounter (Signed)
Pt's wife came by and requested an am appt for 7/7/, emailed Dr. [name]

## 2013-05-26 ENCOUNTER — Telehealth: Payer: Self-pay | Admitting: Oncology

## 2013-05-26 LAB — CEA: CEA: 3.4 ng/mL (ref 0.0–5.0)

## 2013-05-26 NOTE — Telephone Encounter (Signed)
Talked to pt and she is aware of appt for 06/01/13 md onlly

## 2013-05-27 ENCOUNTER — Telehealth: Payer: Self-pay | Admitting: *Deleted

## 2013-05-27 NOTE — Telephone Encounter (Signed)
Message left earlier for pt call back & wife called back & was given results of CT per Dr Patsy Lager instructions.

## 2013-05-27 NOTE — Telephone Encounter (Signed)
Message copied by Sabino Snipes on Wed May 27, 2013  1:43 PM ------      Message from: Levert Feinstein      Created: Tue May 26, 2013  8:59 PM       Call pt  CT good  No further growth of tumor ------

## 2013-06-01 ENCOUNTER — Ambulatory Visit (HOSPITAL_BASED_OUTPATIENT_CLINIC_OR_DEPARTMENT_OTHER): Payer: Medicare Other | Admitting: Oncology

## 2013-06-01 ENCOUNTER — Telehealth: Payer: Self-pay | Admitting: Oncology

## 2013-06-01 ENCOUNTER — Encounter: Payer: Self-pay | Admitting: *Deleted

## 2013-06-01 VITALS — BP 136/54 | HR 80 | Temp 98.0°F | Resp 18 | Ht 67.0 in | Wt 135.4 lb

## 2013-06-01 DIAGNOSIS — C182 Malignant neoplasm of ascending colon: Secondary | ICD-10-CM

## 2013-06-01 NOTE — Progress Notes (Signed)
Hematology and Oncology Follow Up Visit  Charles Faulkner [id number] Feb 05, 1926 79 y.o. 06/01/2013 7:26 PM   Principle Diagnosis: Encounter Diagnosis  Name Primary?  . 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 He was diagnosed with stage IV colon cancer with diffuse peritoneal metastases at diagnosis in April 2012. He underwent resection of the primary lesion in the cecum. He has been maintained on single agent oral Xeloda chemotherapy with an excellent and ongoing response. There was initial regression of the obvious soft tissue disease on CT scans. CEA has not been a good disease activity marker in this man since it has been normal from time of diagnosis until now.  . On a CT scan done 10/09/2012 there was  a new area of soft tissue density in the region of previous surgical clips in the right pelvis which radiologist measured at 2.2 x 2.8 cm. There were no other changes to suggest progression. I initially elected to continue his Xeloda at  the same dose and schedule. A followup scan was done on 02/02/2013 and I personally reviewed these images. The area of concern in the right lower quadrant was now more prominent measuring 3.0 x 2.7 cm. No peritoneal nodularity or ascites is noted. No liver involvement was suspected. Since the disease was localized to the right lower quadrant in an  area where there were easily visible surgical clips, I felt that he might  be a candidate for a local procedure either, surgery or radiation. His surgeon deferred to radiation oncology who did think palliative radiation would be reasonable. He was treated with 37.5 gray in 15 fractions between April 14 and 03/27/2013 by Dr. Dorothy Puffer. He tolerated treatments well. I  stopped his Xeloda chemotherapy. Followup CT scan done in anticipation of today's visit on 05/25/2013 shows   stable appearance of the soft tissue mass in the area of concern with no further growth and no other new abnormalities noted. He has a chronic left pleural effusion due to his underlying cardiac disease.  His wife says that he is doing great. He is eating well. He denies any abdominal pain. He had some initial diarrhea with the prep for the CT scan which has now resolved. No hematochezia or melena.   Medications: reviewed  Allergies:  Allergies  Allergen Reactions  . Metoprolol Tartrate     REACTION: hives    Review of Systems: Constitutional:  No constitutional symptoms  Respiratory: No cough or dyspnea Cardiovascular:  No chest pain or palpitations Gastrointestinal: See above Genito-Urinary: Not questioned Musculoskeletal: No muscle bone or joint pain Neurologic: No headache or change in vision Skin: No rash Remaining ROS negative.  Physical Exam: Blood pressure 136/54, pulse 80, temperature 98 F (36.7 C), temperature source Oral, resp. rate 18, height 5\' 7"  (1.702 m), weight 135 lb 6.4 oz (61.417 kg). 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 Motor strength 5 over 5. Reflexes 1+ symmetric Skin: No rash or ecchymosis  Lab Results: Lab Results  Component Value Date   WBC 6.2 05/25/2013   HGB 13.1  05/25/2013   HCT 37.9* 05/25/2013   MCV 99.3* 05/25/2013   PLT 157 05/25/2013     Chemistry      Component Value Date/Time   NA 140 05/25/2013 0939   NA 140 07/17/2012 0919   NA 143 06/05/2012 0822   K 4.0 05/25/2013 0939   K 3.7 07/17/2012 0919   K 3.9 06/05/2012 0822   CL 107 04/01/2013 1048   CL 105 07/17/2012 0919   CL 100 06/05/2012 0822   CO2 28 05/25/2013 0939   CO2 27 07/17/2012 0919   CO2 29 06/05/2012 0822   BUN 12.0 05/25/2013 0939   BUN 14 07/17/2012 0919   BUN 15 06/05/2012 0822   CREATININE 0.9 05/25/2013 0939   CREATININE 0.82 07/17/2012 0919   CREATININE 0.8 06/05/2012 0822      Component Value Date/Time   CALCIUM 10.1 05/25/2013 0939   CALCIUM 9.6 07/17/2012 0919   CALCIUM 9.8 06/05/2012 0822   ALKPHOS 107 05/25/2013 0939   ALKPHOS 82 07/17/2012 0919   ALKPHOS 101* 06/05/2012 0822   AST 26 05/25/2013 0939   AST 22 07/17/2012 0919   AST 41* 06/05/2012 0822   ALT 21 05/25/2013 0939   ALT 14 07/17/2012 0919   BILITOT 0.91 05/25/2013 0939   BILITOT 1.3* 07/17/2012 0919   BILITOT 1.30 06/05/2012 1610       Radiological Studies: Ct Abdomen Pelvis W Contrast  05/25/2013   *RADIOLOGY REPORT*  Clinical Data: Follow-up right colon carcinoma. Recently completed chemotherapy, prior surgery and radiation therapy.  Diffuse abdominal pain.  CT ABDOMEN AND PELVIS WITH CONTRAST  Technique:  Multidetector CT imaging of the abdomen and pelvis was performed following the standard protocol during bolus administration of intravenous contrast.  Contrast: OMNIPAQUE IOHEXOL 300 MG/ML  SOLN  Comparison: 02/02/2013  Findings: Moderate left pleural effusion is stable in size while tiny right pleural effusion has decreased since prior exam. Cardiomegaly remains stable.  No liver masses are identified.  The spleen, pancreas, and right adrenal gland are normal appearance.  Diffuse left adrenal thickening remains stable in appearance.  A small bilateral renal cysts are stable there is no evidence of renal masses  or hydronephrosis.  Low attenuation mass surrounding surgical clips in the right lower quadrant is again seen involving the left external iliac lymph node chain.  This mass measures 2.7 x 3.0 cm on image 61 and is stable since previous study.  No new or enlarging masses or areas of lymphadenopathy are identified within the abdomen or pelvis.  No evidence of bowel wall thickening or dilatation.  A small amount of pelvic free fluid noted, without evidence of inflammatory process or abscess.  No suspicious bone lesions identified.  Chronic right hip dislocation again noted. Distal abdominal aortic aneurysm remains stable measuring 3.1 cm in diameter.  No evidence of aneurysm leak or rupture.  IMPRESSION:  1.  Stable right lower quadrant soft tissue mass surrounding surgical clips along the the right external iliac chain. 2.  No evidence of new or progressive disease within the abdomen or pelvis. 3.  Decreased size of tiny right pleural effusion.  Stable moderate left pleural effusion and cardiomegaly. 4.  Stable distal abdominal aortic aneurysm measuring 3.1 cm.   Original Report Authenticated By: Myles Rosenthal, M.D.    Impression: #1. Localized progression of colon cancer in the right pelvis. Stabilization with no further growth following a short course of radiation. No new areas of disease on current scans. Plan: He remains asymptomatic. Continue observation alone. I will repeat CT scans again in 3 months.   #2. Coronary artery disease status post bypass surgery status post coronary stenting.  #3. Mitral and aortic valvular disease.  #4. Cerebrovascular disease status post right carotid endarterectomy.  #5. Peripheral vascular disease.  #6. Bilateral pleural effusions left greater than right. Likely Cardiac origin stable on current CT scan compared with previous study.Currently asymptomatic.  #7. Sensorineural hearing deficits     CC:.    Levert Feinstein, MD 7/7/20147:26 PM

## 2013-06-01 NOTE — Telephone Encounter (Signed)
Gave pt appt for lab and MD on October 2014, gave pt oral contrast for CT

## 2013-06-03 ENCOUNTER — Other Ambulatory Visit: Payer: Self-pay

## 2013-06-03 MED ORDER — NIACIN ER (ANTIHYPERLIPIDEMIC) 500 MG PO TBCR: 1500.0000 mg | EXTENDED_RELEASE_TABLET | Freq: Every day | ORAL | Status: DC

## 2013-06-03 NOTE — Telephone Encounter (Signed)
Rx was sent to pharmacy electronically via [employer].  

## 2013-07-06 ENCOUNTER — Other Ambulatory Visit: Payer: Self-pay | Admitting: *Deleted

## 2013-07-06 MED ORDER — CLOPIDOGREL BISULFATE 75 MG PO TABS: 75.0000 mg | ORAL_TABLET | Freq: Every day | ORAL | Status: DC

## 2013-07-08 ENCOUNTER — Other Ambulatory Visit: Payer: Self-pay | Admitting: Oncology

## 2013-07-08 DIAGNOSIS — C182 Malignant neoplasm of ascending colon: Secondary | ICD-10-CM

## 2013-07-29 ENCOUNTER — Other Ambulatory Visit: Payer: Self-pay | Admitting: *Deleted

## 2013-07-29 MED ORDER — DIGOXIN 125 MCG PO TABS: 0.1250 mg | ORAL_TABLET | Freq: Every day | ORAL | Status: DC

## 2013-07-29 NOTE — Telephone Encounter (Signed)
Rx was sent to pharmacy electronically. 

## 2013-08-01 ENCOUNTER — Other Ambulatory Visit (HOSPITAL_COMMUNITY): Payer: Self-pay | Admitting: Cardiovascular Disease

## 2013-08-04 NOTE — Telephone Encounter (Signed)
Rx was sent to pharmacy electronically. 

## 2013-08-05 ENCOUNTER — Encounter: Payer: Self-pay | Admitting: Cardiovascular Disease

## 2013-08-05 ENCOUNTER — Ambulatory Visit (INDEPENDENT_AMBULATORY_CARE_PROVIDER_SITE_OTHER): Payer: Medicare Other | Admitting: Cardiovascular Disease

## 2013-08-05 VITALS — BP 132/82 | HR 89 | Ht 67.0 in | Wt 137.0 lb

## 2013-08-05 DIAGNOSIS — I6529 Occlusion and stenosis of unspecified carotid artery: Secondary | ICD-10-CM

## 2013-08-05 DIAGNOSIS — R0602 Shortness of breath: Secondary | ICD-10-CM

## 2013-08-05 DIAGNOSIS — R0789 Other chest pain: Secondary | ICD-10-CM

## 2013-08-05 DIAGNOSIS — R600 Localized edema: Secondary | ICD-10-CM

## 2013-08-05 DIAGNOSIS — R609 Edema, unspecified: Secondary | ICD-10-CM

## 2013-08-05 MED ORDER — HYDROCHLOROTHIAZIDE 25 MG PO TABS: 25.0000 mg | ORAL_TABLET | Freq: Every day | ORAL | Status: DC

## 2013-08-05 MED ORDER — NITROGLYCERIN 0.4 MG SL SUBL: 0.4000 mg | SUBLINGUAL_TABLET | SUBLINGUAL | Status: AC | PRN

## 2013-08-05 NOTE — Progress Notes (Signed)
08/05/2013 TORIBIO Faulkner   1926/07/06  [id number]  Primary Physician [name]., MD Primary Cardiologist: [name] MD [name]  HPI:  The patient is an 79 year old thin and frail-appearing married Caucasian male with no children who is accompanied by his wife today. I saw him 8 month ago.   He has a history of CAD and PVOD. He had coronary artery bypass grafting in 2002 and subsequent stenting of his RCA ostium by Dr. [name] September 20, 2004. He had right carotid endarterectomy performed by Dr. [name] in 2006 with moderate left ICA stenosis. He is neurologically asymptomatic. We have followed his duplex ultrasounds here in the office in the past. He also has moderate aortic stenosis with a valve area of 0.92 sq cm by echo May 2012. He has an EF in the 40% to 45% range. His last Myoview, performed February 13, 2010, revealed apical scar. He has chronic AFib, rate controlled, not on Coumadin anticoagulation.   His other problems include colon cancer, status post hemicolectomy with small bowel resection due to adenocarcinoma with peritoneal metastases. He has been on chemotherapy, followed by Dr. [name], and has done amazingly well. He apparently has had some progression of disease by CT scanning and is scheduled to undergo initiation of radiation therapy.   Since I saw him back in the office 03/02/13 he developed episodes of dyspnea with substernal chest pressure relieved with sublingual nitroglycerin. He has also had progressive weakness.      Current Outpatient Prescriptions  Medication Sig Dispense Refill  . atorvastatin (LIPITOR) 40 MG tablet ONE TABLET EVERY EVENING  30 tablet  7  . clopidogrel (PLAVIX) 75 MG tablet Take 1 tablet (75 mg total) by mouth daily.  30 tablet  6  . digoxin (LANOXIN) 0.125 MG tablet Take 1 tablet (0.125 mg total) by mouth daily.  30 tablet  9  . hydrochlorothiazide (HYDRODIURIL) 25 MG tablet Take 1 tablet (25 mg total)  by mouth daily.  90 tablet  3  . lansoprazole (PREVACID) 15 MG capsule Take 15 mg by mouth daily.        . Multiple Vitamins-Minerals ([employer] SPECTRAVITE SENIOR PO) Take 1 tablet by mouth daily.        . niacin (NIASPAN) 500 MG CR tablet Take 3 tablets (1,500 mg total) by mouth at bedtime.  90 tablet  9  . nitroGLYCERIN (NITROSTAT) 0.4 MG SL tablet Place 1 tablet (0.4 mg total) under the tongue every 5 (five) minutes as needed for chest pain.  25 tablet  6  . ondansetron (ZOFRAN-ODT) 4 MG disintegrating tablet Take 1 tablet twice daily prior to chemotherapy 7 days on/7 days off.  60 tablet  5  . polyethylene glycol powder (GLYCOLAX/MIRALAX) powder as needed.      . potassium chloride SA (K-DUR,KLOR-CON) 20 MEQ tablet TAKE 1 TABLET (20 MEQ TOTAL) BY MOUTH DAILY. OR AS DIRECTED BY MD  60 tablet  1  . TENORMIN 50 MG tablet Take 25 mg by mouth 2 (two) times daily.       Marland Kitchen ZETIA 10 MG tablet Take 1 tablet by mouth daily.       No current facility-administered medications for this visit.    Allergies  Allergen Reactions  . Metoprolol Tartrate     REACTION: hives    History   Social History  . Marital Status: Married    Spouse Name: N/A    Number of Children: 0  . Years of Education: N/A  Occupational History  . Not on file.   Social History Main Topics  . Smoking status: Former Games developer  . Smokeless tobacco: Never Used     Comment: quit remotely  . Alcohol Use: No  . Drug Use: No  . Sexual Activity: No   Other Topics Concern  . Not on file   Social History Narrative  . No narrative on file     Review of Systems: General: negative for chills, fever, night sweats or weight changes.  Cardiovascular: negative for chest pain, dyspnea on exertion, edema, orthopnea, palpitations, paroxysmal nocturnal dyspnea or shortness of breath Dermatological: negative for rash Respiratory: negative for cough or wheezing Urologic: negative for hematuria Abdominal: negative for nausea, vomiting,  diarrhea, bright red blood per rectum, melena, or hematemesis Neurologic: negative for visual changes, syncope, or dizziness All other systems reviewed and are otherwise negative except as noted above.    Blood pressure 132/82, pulse 89, height 5\' 7"  (1.702 m), weight 137 lb (62.143 kg).  General appearance: alert and no distress Neck: no adenopathy, no JVD, supple, symmetrical, trachea midline, thyroid not enlarged, symmetric, no tenderness/mass/nodules and bilateral carotid bruits Lungs: clear to auscultation bilaterally Heart: irregularly irregular rhythm and 2/6 systolic ejection murmur consistent with aortic stenosis Extremities: 2+ right lower extremity edema, 1+ left lower [name]  EKG atrial fibrillation with a ventricular response of 89 and nonspecific IVCD with septal Q waves unchanged from his last EKG  ASSESSMENT AND PLAN:   S/P CABG x 5 Status post coronary artery bypass grafting in 2002 with stenting of his RCA ostium 09/20/04. His ejection fraction is in the 40-45% range.his last Myoview performed 02/13/10 for ischemia. Since I saw him last 03/02/13 he developed progressive shortness of breath with associated chest tightness. These episodes are relieved with sublingual nitroglycerin glycerin. I am going to get a 2-D echocardiogram and pharmacologic Myoview stress test to assess for progression of disease and an ischemic etiology.  Aortic stenosis, moderate His last 2-D echo performed 04/11/11 revealed an EF of 40-45% with moderate aortic stenosis and a valve area of 0.9 cm. Because of progressive shortness of breath and repeat a 2-D echo to assess for progression of his aortic stenosis.  Chronic atrial fibrillation Rate controlled with a heart rate of 89 today. He is not on oral anticoagulation because of comorbidities and fall risk      [name] MD [facility] 08/05/2013 10:27 AM

## 2013-08-05 NOTE — Assessment & Plan Note (Signed)
Status post coronary artery bypass grafting in 2002 with stenting of his RCA ostium 09/20/04. His ejection fraction is in the 40-45% range.his last Myoview performed 02/13/10 for ischemia. Since I saw him last 03/02/13 he developed progressive shortness of breath with associated chest tightness. These episodes are relieved with sublingual nitroglycerin glycerin. I am going to get a 2-D echocardiogram and pharmacologic Myoview stress test to assess for progression of disease and an ischemic etiology.

## 2013-08-05 NOTE — Assessment & Plan Note (Signed)
Rate controlled with a heart rate of 89 today. He is not on oral anticoagulation because of comorbidities and fall risk

## 2013-08-05 NOTE — Progress Notes (Signed)
   Patient ID: Charles Faulkner, male    DOB: [birth year], 79 y.o.   MRN: [id number]  HPI    Review of Systems    Physical Exam

## 2013-08-05 NOTE — Patient Instructions (Addendum)
  We will see you back in follow up after the tests are completed  Dr [name] has ordered a lexiscan myoview, echocardiogram, and carotid doppler

## 2013-08-05 NOTE — Assessment & Plan Note (Addendum)
His last 2-D echo performed 04/11/11 revealed an EF of 40-45% with moderate aortic stenosis and a valve area of 0.9 cm. Because of progressive shortness of breath and repeat a 2-D echo to assess for progression of his aortic stenosis.

## 2013-08-11 ENCOUNTER — Ambulatory Visit (INDEPENDENT_AMBULATORY_CARE_PROVIDER_SITE_OTHER)
Admission: RE | Admit: 2013-08-11 | Discharge: 2013-08-11 | Disposition: A | Discharge status: Home / Self Care | Payer: Medicare Other | Source: Ambulatory Visit | Attending: Cardiovascular Disease | Admitting: Cardiovascular Disease

## 2013-08-11 DIAGNOSIS — R0989 Other specified symptoms and signs involving the circulatory and respiratory systems: Secondary | ICD-10-CM | POA: Insufficient documentation

## 2013-08-11 DIAGNOSIS — R079 Chest pain, unspecified: Secondary | ICD-10-CM | POA: Insufficient documentation

## 2013-08-11 DIAGNOSIS — R1110 Vomiting, unspecified: Secondary | ICD-10-CM | POA: Insufficient documentation

## 2013-08-11 DIAGNOSIS — R5381 Other malaise: Secondary | ICD-10-CM | POA: Insufficient documentation

## 2013-08-11 DIAGNOSIS — I6529 Occlusion and stenosis of unspecified carotid artery: Secondary | ICD-10-CM | POA: Insufficient documentation

## 2013-08-11 DIAGNOSIS — I739 Peripheral vascular disease, unspecified: Secondary | ICD-10-CM | POA: Insufficient documentation

## 2013-08-11 DIAGNOSIS — R0789 Other chest pain: Secondary | ICD-10-CM

## 2013-08-11 DIAGNOSIS — R002 Palpitations: Secondary | ICD-10-CM | POA: Insufficient documentation

## 2013-08-11 DIAGNOSIS — R0609 Other forms of dyspnea: Secondary | ICD-10-CM | POA: Insufficient documentation

## 2013-08-11 DIAGNOSIS — Z87891 Personal history of nicotine dependence: Secondary | ICD-10-CM | POA: Insufficient documentation

## 2013-08-11 MED ORDER — TECHNETIUM TC 99M SESTAMIBI GENERIC - CARDIOLITE
10.0000 | Freq: Once | INTRAVENOUS | Status: AC | PRN
  Administered 2013-08-11: 10 via INTRAVENOUS

## 2013-08-11 MED ORDER — REGADENOSON 0.4 MG/5ML IV SOLN
0.4000 mg | Freq: Once | INTRAVENOUS | Status: AC
  Administered 2013-08-11: 0.4 mg via INTRAVENOUS

## 2013-08-11 MED ORDER — TECHNETIUM TC 99M SESTAMIBI GENERIC - CARDIOLITE
30.0000 | Freq: Once | INTRAVENOUS | Status: AC | PRN
  Administered 2013-08-11: 30 via INTRAVENOUS

## 2013-08-11 NOTE — Procedures (Addendum)
[facility] [address] [place] [address] [place] [address] [phone]  Cardiology Nuclear Med Study  Charles Faulkner is a 79 y.o. male     MRN : [id number]     DOB: 1926/10/28  Procedure Date: 08/11/2013  Nuclear Med Background Indication for Stress Test:  Graft Patency, Stent Patency and Abnormal EKG History:  CAD;CABG X5 --2002;STENT/PTCA--2005;AF;AS Cardiac Risk Factors: Carotid Disease, History of Smoking and PVD  Symptoms:  Chest Pain, DOE, Fatigue, Palpitations, SOB and Vomiting   Nuclear Pre-Procedure Caffeine/Decaff Intake:  7:00pm NPO After: 5:00am   IV Site: R Forearm  IV 0.9% NS with Angio Cath:  22g  Chest Size (in):  38"  IV Started by: Emmit Pomfret, RN  Height: 5\' 7"  (1.702 m)  Cup Size: n/a  BMI:  Body mass index is 21.45 kg/(m^2). Weight:  137 lb (62.143 kg)   Tech Comments:  N/A    Nuclear Med Study 1 or 2 day study: 1 day  Stress Test Type:  Lexiscan  Order Authorizing Provider:  Obie Dredge   Resting Radionuclide: Technetium 55m Sestamibi  Resting Radionuclide Dose: 10.5 mCi   Stress Radionuclide:  Technetium 5m Sestamibi  Stress Radionuclide Dose: 31.0 mCi           Stress Protocol Rest HR: 96 Stress HR: 98  Rest BP: 132/73 Stress BP: 151/73  Exercise Time (min): n/a METS: n/a          Dose of Adenosine (mg):  n/a Dose of Lexiscan: 0.4 mg  Dose of Atropine (mg): n/a Dose of Dobutamine: n/a mcg/kg/min (at max HR)  Stress Test Technologist: Ernestene Mention, CCT Nuclear Technologist: Koren Shiver, CNMT   Rest Procedure:  Myocardial perfusion imaging was performed at rest 45 minutes following the intravenous administration of Technetium 65m Sestamibi. Stress Procedure:  The patient received IV Lexiscan 0.4 mg over 15-seconds.  Technetium 59m Sestamibi injected at 30-seconds.  There were no significant changes with Lexiscan.  Quantitative spect images were obtained after a 45 minute  delay.  Transient Ischemic Dilatation (Normal <1.22):  1.07 Lung/Heart Ratio (Normal <0.45):  0.41 QGS EDV:  n/a ml QGS ESV:  n/a ml LV Ejection Fraction: Study not gated     Rest ECG: Atrial Fibrilliation and LBBB  Stress ECG: No significant change from baseline ECG  QPS Raw Data Images:  Normal; no motion artifact; normal heart/lung ratio. Stress Images:  There is severely decreased uptake in the mid-distal anterior wall and apex and in the mid basalinferior wall. There is moderately reduced uptake in a small area of the distal anterolateral wall. Rest Images:  There is no change in the anteroapical and inferior wall defects. There is partial improvement in the anterolateral wall. Subtraction (SDS):  Fixad anteroapical and inferior defects are consistent with infarction/scar. There appears to be mild anterolateral ischemia.  Impression Exercise Capacity:  Lexiscan with no exercise. BP Response:  Normal blood pressure response. Clinical Symptoms:  No significant symptoms noted. ECG Impression:  No significant ST segment change suggestive of ischemia. Comparison with Prior Nuclear Study: the previous study anteroapical and inferior fixed defects are previously described. The area of mild anterolateral ishemia is new.  Overall Impression:  Low risk stress nuclear study with scars in the distribution of the mid-distal LAD artery and right coronary artery and new mild anterolateral ischemia (maybe in a diagonal artery territory)..  LV Wall Motion:  non-gated  (atrial fibrillation)   Bellami Farrelly, MD  08/11/2013 1:00 PM

## 2013-08-13 ENCOUNTER — Telehealth (HOSPITAL_COMMUNITY): Payer: Self-pay | Admitting: *Deleted

## 2013-08-13 ENCOUNTER — Ambulatory Visit (INDEPENDENT_AMBULATORY_CARE_PROVIDER_SITE_OTHER): Payer: Medicare Other | Admitting: Cardiology

## 2013-08-13 ENCOUNTER — Emergency Department (HOSPITAL_COMMUNITY): Payer: Medicare Other

## 2013-08-13 ENCOUNTER — Inpatient Hospital Stay (HOSPITAL_COMMUNITY)
Admission: EM | Admit: 2013-08-13 | Discharge: 2013-08-18 | DRG: 291 | Disposition: A | Discharge status: Home / Self Care | Payer: Medicare Other | Attending: Cardiovascular Disease | Admitting: Cardiovascular Disease

## 2013-08-13 ENCOUNTER — Ambulatory Visit (HOSPITAL_BASED_OUTPATIENT_CLINIC_OR_DEPARTMENT_OTHER)
Admission: RE | Admit: 2013-08-13 | Discharge: 2013-08-13 | Disposition: A | Discharge status: Home / Self Care | Payer: Medicare Other | Source: Ambulatory Visit | Attending: Cardiovascular Disease | Admitting: Cardiovascular Disease

## 2013-08-13 ENCOUNTER — Encounter (HOSPITAL_COMMUNITY): Payer: Self-pay | Admitting: Emergency Medicine

## 2013-08-13 VITALS — BP 134/82 | HR 90 | Resp 26

## 2013-08-13 DIAGNOSIS — I350 Nonrheumatic aortic (valve) stenosis: Secondary | ICD-10-CM

## 2013-08-13 DIAGNOSIS — I252 Old myocardial infarction: Secondary | ICD-10-CM

## 2013-08-13 DIAGNOSIS — R4181 Age-related cognitive decline: Secondary | ICD-10-CM | POA: Diagnosis present

## 2013-08-13 DIAGNOSIS — J810 Acute pulmonary edema: Secondary | ICD-10-CM

## 2013-08-13 DIAGNOSIS — H9190 Unspecified hearing loss, unspecified ear: Secondary | ICD-10-CM | POA: Diagnosis present

## 2013-08-13 DIAGNOSIS — I5023 Acute on chronic systolic (congestive) heart failure: Principal | ICD-10-CM | POA: Diagnosis present

## 2013-08-13 DIAGNOSIS — R7309 Other abnormal glucose: Secondary | ICD-10-CM | POA: Diagnosis present

## 2013-08-13 DIAGNOSIS — I739 Peripheral vascular disease, unspecified: Secondary | ICD-10-CM | POA: Diagnosis present

## 2013-08-13 DIAGNOSIS — J9600 Acute respiratory failure, unspecified whether with hypoxia or hypercapnia: Secondary | ICD-10-CM

## 2013-08-13 DIAGNOSIS — R0602 Shortness of breath: Secondary | ICD-10-CM

## 2013-08-13 DIAGNOSIS — Z23 Encounter for immunization: Secondary | ICD-10-CM

## 2013-08-13 DIAGNOSIS — Z9861 Coronary angioplasty status: Secondary | ICD-10-CM

## 2013-08-13 DIAGNOSIS — D7589 Other specified diseases of blood and blood-forming organs: Secondary | ICD-10-CM | POA: Diagnosis present

## 2013-08-13 DIAGNOSIS — I482 Chronic atrial fibrillation, unspecified: Secondary | ICD-10-CM

## 2013-08-13 DIAGNOSIS — E785 Hyperlipidemia, unspecified: Secondary | ICD-10-CM | POA: Diagnosis present

## 2013-08-13 DIAGNOSIS — I714 Abdominal aortic aneurysm, without rupture, unspecified: Secondary | ICD-10-CM | POA: Diagnosis present

## 2013-08-13 DIAGNOSIS — E876 Hypokalemia: Secondary | ICD-10-CM | POA: Diagnosis not present

## 2013-08-13 DIAGNOSIS — Z9221 Personal history of antineoplastic chemotherapy: Secondary | ICD-10-CM

## 2013-08-13 DIAGNOSIS — I080 Rheumatic disorders of both mitral and aortic valves: Secondary | ICD-10-CM | POA: Diagnosis present

## 2013-08-13 DIAGNOSIS — I2589 Other forms of chronic ischemic heart disease: Secondary | ICD-10-CM | POA: Diagnosis present

## 2013-08-13 DIAGNOSIS — C786 Secondary malignant neoplasm of retroperitoneum and peritoneum: Secondary | ICD-10-CM | POA: Diagnosis present

## 2013-08-13 DIAGNOSIS — I472 Ventricular tachycardia, unspecified: Secondary | ICD-10-CM | POA: Diagnosis present

## 2013-08-13 DIAGNOSIS — R0603 Acute respiratory distress: Secondary | ICD-10-CM

## 2013-08-13 DIAGNOSIS — Z8249 Family history of ischemic heart disease and other diseases of the circulatory system: Secondary | ICD-10-CM

## 2013-08-13 DIAGNOSIS — Z800 Family history of malignant neoplasm of digestive organs: Secondary | ICD-10-CM

## 2013-08-13 DIAGNOSIS — I255 Ischemic cardiomyopathy: Secondary | ICD-10-CM

## 2013-08-13 DIAGNOSIS — Z79899 Other long term (current) drug therapy: Secondary | ICD-10-CM

## 2013-08-13 DIAGNOSIS — I2510 Atherosclerotic heart disease of native coronary artery without angina pectoris: Secondary | ICD-10-CM | POA: Diagnosis present

## 2013-08-13 DIAGNOSIS — F411 Generalized anxiety disorder: Secondary | ICD-10-CM | POA: Diagnosis present

## 2013-08-13 DIAGNOSIS — C180 Malignant neoplasm of cecum: Secondary | ICD-10-CM | POA: Diagnosis present

## 2013-08-13 DIAGNOSIS — Z87891 Personal history of nicotine dependence: Secondary | ICD-10-CM

## 2013-08-13 DIAGNOSIS — C182 Malignant neoplasm of ascending colon: Secondary | ICD-10-CM

## 2013-08-13 DIAGNOSIS — J811 Chronic pulmonary edema: Secondary | ICD-10-CM

## 2013-08-13 DIAGNOSIS — I4891 Unspecified atrial fibrillation: Secondary | ICD-10-CM

## 2013-08-13 DIAGNOSIS — Z951 Presence of aortocoronary bypass graft: Secondary | ICD-10-CM

## 2013-08-13 DIAGNOSIS — J984 Other disorders of lung: Secondary | ICD-10-CM

## 2013-08-13 DIAGNOSIS — D509 Iron deficiency anemia, unspecified: Secondary | ICD-10-CM | POA: Diagnosis present

## 2013-08-13 DIAGNOSIS — I359 Nonrheumatic aortic valve disorder, unspecified: Secondary | ICD-10-CM

## 2013-08-13 DIAGNOSIS — I509 Heart failure, unspecified: Secondary | ICD-10-CM | POA: Diagnosis present

## 2013-08-13 DIAGNOSIS — I10 Essential (primary) hypertension: Secondary | ICD-10-CM | POA: Diagnosis present

## 2013-08-13 DIAGNOSIS — I4729 Other ventricular tachycardia: Secondary | ICD-10-CM | POA: Diagnosis present

## 2013-08-13 DIAGNOSIS — I498 Other specified cardiac arrhythmias: Secondary | ICD-10-CM | POA: Diagnosis present

## 2013-08-13 DIAGNOSIS — Z7902 Long term (current) use of antithrombotics/antiplatelets: Secondary | ICD-10-CM

## 2013-08-13 DIAGNOSIS — Z923 Personal history of irradiation: Secondary | ICD-10-CM

## 2013-08-13 LAB — URINALYSIS, ROUTINE W REFLEX MICROSCOPIC
Glucose, UA: NEGATIVE mg/dL
Ketones, ur: NEGATIVE mg/dL
Leukocytes, UA: NEGATIVE
Nitrite: NEGATIVE
Protein, ur: 100 mg/dL — AB

## 2013-08-13 LAB — COMPREHENSIVE METABOLIC PANEL
Alkaline Phosphatase: 122 U/L — ABNORMAL HIGH (ref 39–117)
BUN: 17 mg/dL (ref 6–23)
Chloride: 99 mEq/L (ref 96–112)
Creatinine, Ser: 0.84 mg/dL (ref 0.50–1.35)
GFR calc Af Amer: >90 mL/min (ref >90)
Glucose, Bld: 226 mg/dL — ABNORMAL HIGH (ref 70–99)
Potassium: 4.1 mEq/L (ref 3.5–5.1)
Total Bilirubin: 0.8 mg/dL (ref 0.3–1.2)

## 2013-08-13 LAB — CBC
HCT: 42.7 % (ref 39.0–52.0)
Hemoglobin: 14.1 g/dL (ref 13.0–17.0)
MCH: 34.2 pg — ABNORMAL HIGH (ref 26.0–34.0)
MCHC: 33 g/dL (ref 30.0–36.0)
MCHC: 35 g/dL (ref 30.0–36.0)
MCV: 102.2 fL — ABNORMAL HIGH (ref 78.0–100.0)
Platelets: 132 10*3/uL — ABNORMAL LOW (ref 150–400)
RDW: 14.2 % (ref 11.5–15.5)
WBC: 11.8 10*3/uL — ABNORMAL HIGH (ref 4.0–10.5)

## 2013-08-13 LAB — DIGOXIN LEVEL: Digoxin Level: 0.7 ng/mL — ABNORMAL LOW (ref 0.8–2.0)

## 2013-08-13 LAB — URINE MICROSCOPIC-ADD ON

## 2013-08-13 LAB — POCT I-STAT, CHEM 8
HCT: 45 % (ref 39.0–52.0)
Hemoglobin: 15.3 g/dL (ref 13.0–17.0)
Potassium: 4.4 mEq/L (ref 3.5–5.1)
Sodium: 141 mEq/L (ref 135–145)

## 2013-08-13 LAB — POCT I-STAT 3, ART BLOOD GAS (G3+)
Acid-base deficit: 4 mmol/L — ABNORMAL HIGH (ref 0.0–2.0)
pCO2 arterial: 41.7 mmHg (ref 35.0–45.0)
pO2, Arterial: 80 mmHg (ref 80.0–100.0)

## 2013-08-13 LAB — GLUCOSE, CAPILLARY: Glucose-Capillary: 138 mg/dL — ABNORMAL HIGH (ref 70–99)

## 2013-08-13 LAB — CREATININE, SERUM: Creatinine, Ser: 0.83 mg/dL (ref 0.50–1.35)

## 2013-08-13 LAB — POCT I-STAT TROPONIN I: Troponin i, poc: 0 ng/mL (ref 0.00–0.08)

## 2013-08-13 MED ORDER — SODIUM CHLORIDE 0.9 % IV SOLN: 250.0000 mL | INTRAVENOUS | Status: DC | PRN

## 2013-08-13 MED ORDER — ALBUTEROL SULFATE (5 MG/ML) 0.5% IN NEBU: 2.5000 mg | INHALATION_SOLUTION | RESPIRATORY_TRACT | Status: DC | PRN

## 2013-08-13 MED ORDER — FAMOTIDINE IN NACL 20-0.9 MG/50ML-% IV SOLN
20.0000 mg | INTRAVENOUS | Status: DC
  Administered 2013-08-13: 20 mg via INTRAVENOUS
  Filled 2013-08-13 (×2): qty 50

## 2013-08-13 MED ORDER — PANTOPRAZOLE SODIUM 40 MG PO TBEC
40.0000 mg | DELAYED_RELEASE_TABLET | Freq: Every day | ORAL | Status: DC
  Administered 2013-08-14 – 2013-08-18 (×5): 40 mg via ORAL
  Filled 2013-08-13 (×5): qty 1

## 2013-08-13 MED ORDER — POLYETHYLENE GLYCOL 3350 17 G PO PACK
17.0000 g | PACK | Freq: Every day | ORAL | Status: DC
  Administered 2013-08-17: 11:00:00 17 g via ORAL
  Filled 2013-08-13 (×6): qty 1

## 2013-08-13 MED ORDER — INFLUENZA VAC SPLIT QUAD 0.5 ML IM SUSP
0.5000 mL | INTRAMUSCULAR | Status: AC
  Administered 2013-08-14: 0.5 mL via INTRAMUSCULAR
  Filled 2013-08-13: qty 0.5

## 2013-08-13 MED ORDER — ATENOLOL 25 MG PO TABS
25.0000 mg | ORAL_TABLET | Freq: Two times a day (BID) | ORAL | Status: DC
  Administered 2013-08-13 – 2013-08-14 (×3): 25 mg via ORAL
  Filled 2013-08-13 (×6): qty 1

## 2013-08-13 MED ORDER — ACETAMINOPHEN 325 MG PO TABS: 650.0000 mg | ORAL_TABLET | ORAL | Status: DC | PRN

## 2013-08-13 MED ORDER — ENOXAPARIN SODIUM 40 MG/0.4ML ~~LOC~~ SOLN
40.0000 mg | SUBCUTANEOUS | Status: DC
  Administered 2013-08-13 – 2013-08-17 (×5): 40 mg via SUBCUTANEOUS
  Filled 2013-08-13 (×6): qty 0.4

## 2013-08-13 MED ORDER — CLOPIDOGREL BISULFATE 75 MG PO TABS
75.0000 mg | ORAL_TABLET | Freq: Every day | ORAL | Status: DC
  Administered 2013-08-13 – 2013-08-18 (×6): 75 mg via ORAL
  Filled 2013-08-13 (×8): qty 1

## 2013-08-13 MED ORDER — NITROGLYCERIN IN D5W 200-5 MCG/ML-% IV SOLN
5.0000 ug/min | INTRAVENOUS | Status: DC
  Administered 2013-08-13: 5 ug/min via INTRAVENOUS

## 2013-08-13 MED ORDER — SODIUM CHLORIDE 0.9 % IJ SOLN
3.0000 mL | INTRAMUSCULAR | Status: DC | PRN
  Administered 2013-08-17: 3 mL via INTRAVENOUS

## 2013-08-13 MED ORDER — DIGOXIN 125 MCG PO TABS
0.1250 mg | ORAL_TABLET | Freq: Every day | ORAL | Status: DC
  Administered 2013-08-13 – 2013-08-14 (×2): 0.125 mg via ORAL
  Filled 2013-08-13 (×3): qty 1

## 2013-08-13 MED ORDER — FUROSEMIDE 10 MG/ML IJ SOLN
40.0000 mg | Freq: Two times a day (BID) | INTRAMUSCULAR | Status: DC
  Administered 2013-08-13 – 2013-08-14 (×3): 40 mg via INTRAVENOUS
  Filled 2013-08-13 (×7): qty 4

## 2013-08-13 MED ORDER — EZETIMIBE 10 MG PO TABS
10.0000 mg | ORAL_TABLET | Freq: Every day | ORAL | Status: DC
  Administered 2013-08-13 – 2013-08-18 (×6): 10 mg via ORAL
  Filled 2013-08-13 (×6): qty 1

## 2013-08-13 MED ORDER — PNEUMOCOCCAL VAC POLYVALENT 25 MCG/0.5ML IJ INJ
0.5000 mL | INJECTION | INTRAMUSCULAR | Status: AC
  Administered 2013-08-14: 0.5 mL via INTRAMUSCULAR
  Filled 2013-08-13: qty 0.5

## 2013-08-13 MED ORDER — SODIUM CHLORIDE 0.9 % IJ SOLN
3.0000 mL | Freq: Two times a day (BID) | INTRAMUSCULAR | Status: DC
  Administered 2013-08-13 – 2013-08-18 (×10): 3 mL via INTRAVENOUS

## 2013-08-13 MED ORDER — POLYETHYLENE GLYCOL 3350 17 GM/SCOOP PO POWD
17.0000 g | Freq: Every day | ORAL | Status: DC | PRN
  Filled 2013-08-13: qty 255

## 2013-08-13 MED ORDER — ATORVASTATIN CALCIUM 40 MG PO TABS
40.0000 mg | ORAL_TABLET | Freq: Every day | ORAL | Status: DC
  Administered 2013-08-13 – 2013-08-17 (×5): 40 mg via ORAL
  Filled 2013-08-13 (×6): qty 1

## 2013-08-13 MED ORDER — FUROSEMIDE 10 MG/ML IJ SOLN
40.0000 mg | Freq: Once | INTRAMUSCULAR | Status: AC
  Administered 2013-08-13: 40 mg via INTRAVENOUS

## 2013-08-13 MED ORDER — ONDANSETRON HCL 4 MG/2ML IJ SOLN: 4.0000 mg | Freq: Four times a day (QID) | INTRAMUSCULAR | Status: DC | PRN

## 2013-08-13 NOTE — H&P (Signed)
Charles Faulkner is an 79 y.o. male.   Chief Complaint:  SOB HPI:   The patient is an 79 year old thin and frail-appearing married Caucasian male who is a patient of Dr. [name].  He has a history of CAD, PVOD, Chronic afib(not on anticoagulation), colon cancer, status post hemicolectomy with small bowel resection due to adenocarcinoma with peritoneal metastases, HLD, AAA, HTN.  He had coronary artery bypass grafting in 2002 and subsequent stenting of his RCA ostium by Dr. [name] September 20, 2004. He had right carotid endarterectomy performed by Dr. [name] in 2006 with moderate left ICA stenosis. We have followed his duplex ultrasounds. He also has moderate aortic stenosis with a valve area of 0.92 sq cm by echo May 2012. He has an EF in the 40% to 45% range.   His last NST was 16109 and was considered low risk although there was new mild anterolateral ischemia.  He has been on chemotherapy, followed by Dr. [name]. He apparently has had some progression of disease by CT scanning and is scheduled to undergo initiation of radiation therapy.   The patient was at the [facility] office for a 2D echo when he developed severe SOB and stated that he could not breath.  According to his wife he has been having trouble for the last 1-1/2 to 2 weeks.  He sleeps in the recliner and has LEE.  He currently denies nausea, vomiting, fever, chest pain, congestion, abdominal pain, hematochezia, melena, Diarrhea, constipation.  He has noticed decreased exercise tolerance.  He was sent by EMS to the ER; placed on Bi-Pap and given IV lasix.   Medications:  Prior to Admission medications   Medication Sig Start Date End Date Taking? Authorizing Provider  atorvastatin (LIPITOR) 40 MG tablet ONE TABLET EVERY EVENING 08/01/13   [name], MD  clopidogrel (PLAVIX) 75 MG tablet Take 1 tablet (75 mg total) by mouth daily. 07/06/13   [name], MD  digoxin (LANOXIN) 0.125 MG tablet Take 1 tablet (0.125 mg total)  by mouth daily. 07/29/13   [name], MD  hydrochlorothiazide (HYDRODIURIL) 25 MG tablet Take 1 tablet (25 mg total) by mouth daily. 08/05/13   [name], MD  lansoprazole (PREVACID) 15 MG capsule Take 15 mg by mouth daily.      Historical Provider, MD  Multiple Vitamins-Minerals ([employer] SPECTRAVITE SENIOR PO) Take 1 tablet by mouth daily.      Historical Provider, MD  niacin (NIASPAN) 500 MG CR tablet Take 3 tablets (1,500 mg total) by mouth at bedtime. 06/03/13   Governor Rooks, MD  nitroGLYCERIN (NITROSTAT) 0.4 MG SL tablet Place 1 tablet (0.4 mg total) under the tongue every 5 (five) minutes as needed for chest pain. 08/05/13   [name], MD  ondansetron (ZOFRAN-ODT) 4 MG disintegrating tablet Take 1 tablet twice daily prior to chemotherapy 7 days on/7 days off. 12/22/12   Rana Snare, NP  polyethylene glycol powder [facility]) powder as needed. 06/22/11   Historical Provider, MD  potassium chloride SA (K-DUR,KLOR-CON) 20 MEQ tablet TAKE 1 TABLET (20 MEQ TOTAL) BY MOUTH DAILY. OR AS DIRECTED BY MD 07/08/13   Levert Feinstein, MD  TENORMIN 50 MG tablet Take 25 mg by mouth 2 (two) times daily.  02/11/11   Historical Provider, MD  ZETIA 10 MG tablet Take 1 tablet by mouth daily. 02/11/11   Historical Provider, MD     Past Medical History  Diagnosis Date  . CAD (coronary  artery disease)   . PAD (peripheral artery disease)   . Hyperlipemia   . Aortic stenosis, moderate   . A-fib     not candidate for coumadin  . AAA (abdominal aortic aneurysm)     followed by Dr. [name]  . Blood transfusion   . Heart murmur   . Hypertension   . Heart attack 1989, 2002  . Hearing loss of both ears 11/13/2011  . Mitral stenosis and aortic insufficiency 11/13/2011  . Aortic valve stenosis, moderate 11/13/2011  . Chronic atrial fibrillation 11/13/2011  . Benign essential HTN 11/13/2011  . S/P coronary artery stent placement 11/13/2011  . Peripheral vascular disease, asymptomatic  11/13/2011    occluded distal right SFA by PD angiogram  . IDA (iron deficiency anemia)     gi bleed on ASA/Plavix  . Allergy   . History of radiation therapy 03/09/2013-03/27/2013    37.5 gray to right pelvis  . Basal cell carcinoma of ear 11/13/2011  . Cancer     colon  . Colon cancer dx'd 02/2011  . Carotid artery disease     Past Surgical History  Procedure Laterality Date  . Sb capsule endoscopy  11/2004    ?nonbleeding leiomyomas in distal SB  . Esophagogastroduodenoscopy  01/2005    with enteroscopy, normal through proximal jejunum  . Colonoscopy  01/2005    with terminal ileoscopy, normal exam except internal hemorrhoids  . Skin cancer removal    . Coronary artery bypass graft  2002  . Carotid endarterectomy  2006    right  . Rca otium stent  2005  . Colon surgery      Family History  Problem Relation Age of Onset  . Colon cancer Neg Hx   . Liver disease Neg Hx   . Inflammatory bowel disease Neg Hx   . Heart disease Mother   . Heart disease Father   . Cancer Sister     colon   Social History:  reports that he has quit smoking. He has never used smokeless tobacco. He reports that he does not drink alcohol or use illicit drugs.  Allergies:  Allergies  Allergen Reactions  . Metoprolol Tartrate     REACTION: hives     (Not in a hospital admission)  Results for orders placed during the hospital encounter of 08/13/13 (from the past 48 hour(s))  CBC     Status: Abnormal   Collection Time    08/13/13 10:04 AM      Result Value Range   WBC 11.8 (*) 4.0 - 10.5 K/uL   RBC 4.18 (*) 4.22 - 5.81 MIL/uL   Hemoglobin 14.1  13.0 - 17.0 g/dL   HCT 96.0  45.4 - 09.8 %   MCV 102.2 (*) 78.0 - 100.0 fL   MCH 33.7  26.0 - 34.0 pg   MCHC 33.0  30.0 - 36.0 g/dL   RDW 11.9  14.7 - 82.9 %   Platelets 193  150 - 400 K/uL  POCT I-STAT 3, BLOOD GAS (G3+)     Status: Abnormal   Collection Time    08/13/13 10:17 AM      Result Value Range   pH, Arterial 7.230 (*) 7.350 -  7.450   pCO2 arterial 57.8 (*) 35.0 - 45.0 mmHg   pO2, Arterial 295.0 (*) 80.0 - 100.0 mmHg   Bicarbonate 24.2 (*) 20.0 - 24.0 mEq/L   TCO2 26  0 - 100 mmol/L   O2 Saturation 100.0  Acid-base deficit 4.0 (*) 0.0 - 2.0 mmol/L   Collection site RADIAL, ALLEN'S TEST ACCEPTABLE     Sample type ARTERIAL    POCT I-STAT TROPONIN I     Status: None   Collection Time    08/13/13 10:19 AM      Result Value Range   Troponin i, poc 0.00  0.00 - 0.08 ng/mL   Comment 3            Comment: Due to the release kinetics of cTnI,     a negative result within the first hours     of the onset of symptoms does not rule out     myocardial infarction with certainty.     If myocardial infarction is still suspected,     repeat the test at appropriate intervals.  POCT I-STAT, CHEM 8     Status: Abnormal   Collection Time    08/13/13 10:21 AM      Result Value Range   Sodium 141  135 - 145 mEq/L   Potassium 4.4  3.5 - 5.1 mEq/L   Chloride 105  96 - 112 mEq/L   BUN 21  6 - 23 mg/dL   Creatinine, Ser 1.61  0.50 - 1.35 mg/dL   Glucose, Bld 096 (*) 70 - 99 mg/dL   Calcium, Ion 0.45  4.09 - 1.30 mmol/L   TCO2 26  0 - 100 mmol/L   Hemoglobin 15.3  13.0 - 17.0 g/dL   HCT 81.1  91.4 - 78.2 %   Dg Chest Portable 1 View  08/13/2013   *RADIOLOGY REPORT*  Clinical Data: Shortness of breath, BiPap, initial encounter.  PORTABLE CHEST - 1 VIEW  Comparison: 09/24/2011; 04/11/2011  Findings:  Grossly unchanged enlarged cardiac silhouette and mediastinal contours.  Post median sternotomy and CABG.  Pulmonary vasculature is indistinct with cephalization of flow.  Interval development of small to moderate sized left-sided and small right-sided pleural effusions with associated perihilar and bibasilar opacities, left greater than right.  No definite pneumothorax.  Grossly unchanged bones.  IMPRESSION: Findings compatible with pulmonary edema with small to moderate- sized bilateral effusions and associated bibasilar  opacities, left greater than right, likely atelectasis.   Original Report Authenticated By: Tacey Ruiz, MD    Review of Systems  Constitutional: Negative for fever, chills and diaphoresis.  HENT: Negative for congestion.   Respiratory: Positive for shortness of breath. Negative for cough.   Cardiovascular: Positive for orthopnea, leg swelling and PND. Negative for chest pain.  Gastrointestinal: Negative for nausea, vomiting, abdominal pain, diarrhea, constipation and blood in stool.  Genitourinary: Negative for hematuria.  Neurological: Negative for dizziness.  All other systems reviewed and are negative.    Blood pressure 104/51, pulse 96, temperature 97.9 F (36.6 C), temperature source Rectal, resp. rate 23, SpO2 100.00%. Physical Exam  Constitutional: He appears well-developed and well-nourished. He appears distressed.  HENT:  Head: Normocephalic and atraumatic.  Eyes: EOM are normal. Pupils are equal, round, and reactive to light. No scleral icterus.  Neck: Normal range of motion. JVD present.  Cardiovascular: S1 normal and S2 normal.  An irregularly irregular rhythm present. Tachycardia present.   No murmur heard. Pulses:      Radial pulses are 2+ on the right side, and 2+ on the left side.       Dorsalis pedis pulses are 0 on the right side, and 0 on the left side.  Feet are warm.  Respiratory: He has rales.  On Bipap  GI: Soft. Bowel sounds are normal. He exhibits no distension. There is no tenderness.  Musculoskeletal: He exhibits edema.  Right > left LEE.    Neurological: He is alert.  Skin: Skin is warm and dry.  Psychiatric: He has a normal mood and affect.     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  Continue IV lasix.  If he does not respond well, consider dobutamine.   PCCM helping.  Thanks!  HAGER, BRYAN 08/13/2013, 10:55 AM   Patient seen and examined. Agree with assessment and plan.Pleasant 79 yo with h/o CAD, s/p CABG 2002, PVD,s/p CEA,  mod AS, h/o colon ca with mets admitted now with CHF requiring BiPAP initiation. Recent nuclear scan revealed scar in mid distal LAD and RCA territories and mild new anterolateral ischemia possible in diagonal distribution. Will r/o for MI, ?ischemia mediated CHF, IV diuresis. Pt was having an echo today at our office when he decompensated. Consider inotropic support if hypotension ensues.  Family has requested no intubation/CPR. Not an operative candidate for AS.   Lennette Bihari, MD, [facility] 08/13/2013 11:21 AM

## 2013-08-13 NOTE — Progress Notes (Signed)
08/13/2013 Charles Faulkner   Dec 03, 1925  [id number]  Primary Physicia Charles Faulkner., MD Primary Cardiologist: Dr Charles Faulkner  HPI:  79 y/o well known to Dr Charles Faulkner with a history of CAD, s/p CABG X 5 in '02 with a PCI in 2005. He just had a Myoview 08/09/13 that was low risk with some scarring but no new changes. He has been Rx'd for colon cancer with surgery and chemotherapy. He has reported mets to his peritoneum and radiation therapy was to start. He saw Dr Charles Faulkner 08/05/13 and had been having some dyspnea for a few weeks. Today he was scheduled for an echo. His wife says he was doing OK earlier this am and last night but when he got to the lobby he started complaining of SOB. This progressively got worse and by the time I was called to see the pt in the echo he was in respiratory distress. His O2 sat initially was 77%. This improved to 90% but the pt said he could not tolerate having the facemask on. EMS was called. We did manage to get an IV in and gave him 40 mg of IV Lasix. EMS gave him one NTG SL and transported to [facility] ER. When he left the office he was awake and alert but diaphoretic, pale, and in severe respiratory distress. I spoke with Charles Faulkner and asked her about code status. She, and the pt indicated that we should "do whatever needs to be done".    No current facility-administered medications for this visit.   No current outpatient prescriptions on file.   Facility-Administered Medications Ordered in Other Visits  Medication Dose Route Frequency Provider Last Rate Last Dose  . albuterol (PROVENTIL) (5 MG/ML) 0.5% nebulizer solution 2.5 mg  2.5 mg Nebulization Q3H PRN Charles Person, NP      . famotidine (PEPCID) IVPB 20 mg  20 mg Intravenous Q24H Charles Person, NP      . nitroGLYCERIN 0.2 mg/mL in dextrose 5 % infusion  5 mcg/min Intravenous Titrated Charles Baton, MD 0.8 mL/hr at 08/13/13 1107 2.5 mcg/min at 08/13/13 1107    Allergies  Allergen Reactions  . Metoprolol  Tartrate     REACTION: hives    History   Social History  . Marital Status: Married    Spouse Name: N/A    Number of Children: 0  . Years of Education: N/A   Occupational History  . Not on file.   Social History Main Topics  . Smoking status: Former Games developer  . Smokeless tobacco: Never Used     Comment: quit remotely  . Alcohol Use: No  . Drug Use: No  . Sexual Activity: No   Other Topics Concern  . Not on file   Social History Narrative  . No narrative on file     Review of Systems: Unobtainable due to pt factors   Blood pressure 134/82, pulse 90, resp. rate 26, SpO2 77.00%.  General appearance: alert, cooperative, severe distress and pale Neck: no carotid bruit Lungs: bilat rales Rt > Lt Heart: irregularly irregular rhythm  Abdomen: not distended, surgical scar noted Extremities: trace edema Skin: pale, diaphoretic Neurologic: Grossly normal   ASSESSMENT AND PLAN:   Acute respiratory failure .  Acute on chronic systolic heart failure .  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    PLAN  Charles Faulkner was transported urgently to [facility] ER.   Charles Faulkner KPA-C 08/13/2013 1:07 PM

## 2013-08-13 NOTE — ED Notes (Signed)
ekg shown to Dr. Wilkie Aye

## 2013-08-13 NOTE — ED Notes (Signed)
Report called to 2900 wife remains at bedside. And aware of room. Lew Dawes RN

## 2013-08-13 NOTE — ED Notes (Signed)
PA from cardiology at bedside. 

## 2013-08-13 NOTE — ED Notes (Signed)
Report received from [facility] patient resting on stretcher without complaint at present. Wife at bedside. Wife states patient  Appears to be doing much better.

## 2013-08-13 NOTE — Progress Notes (Signed)
CRITICAL VALUE ALERT  Critical value received:  Troponin 0.42  Date of notification:  08/13/2013  Time of notification:  2135  Critical value read back:yes  Nurse who received alert:  Montine Circle   MD notified (1st page):  Dr. Marin Shutter  Time of first page:  [phone]

## 2013-08-13 NOTE — Assessment & Plan Note (Signed)
By echo May 2012

## 2013-08-13 NOTE — Progress Notes (Signed)
2D Echo Performed 08/13/2013    Clearence Ped, RCS

## 2013-08-13 NOTE — ED Notes (Signed)
Dentures removed prior to BIPAP being placed.  Given to family

## 2013-08-13 NOTE — ED Notes (Signed)
Pt went to vascular center and had a sudden onset of SOB. IV and Lasix 40mg  IV was given at center when EMS arrived. EMS gave Nitroglycerin 1 SL en route.

## 2013-08-13 NOTE — Consult Note (Signed)
PULMONARY  / CRITICAL CARE MEDICINE  Name: COLSTON PYLE MRN: [id number] DOB: 07-07-1926    ADMISSION DATE:  08/13/2013 CONSULTATION DATE:  08/13/13  REFERRING MD :  [facility] - Dr. Wilkie Aye  PRIMARY SERVICE: [facility]  CHIEF COMPLAINT:  SOB  BRIEF PATIENT DESCRIPTION: 79 y/o male admitted with CHF exacerbation requiring bipap.  PCCM consulted for pulmonary assistance.   SIGNIFICANT EVENTS / STUDIES:  9/18 - Admit from [facility] office with CHF exacerbation  LINES / TUBES: PIV 9/18>>>  CULTURES: UA 9/18>>>   ANTIBIOTICS:   HISTORY OF PRESENT ILLNESS:  79 y/o male with PMH of Colon Ca, HTN, CAD, AFib (not on coumadin), AAA, HLD, moderate AS, MI, Mitral Stenosis who presented to [facility] office for follow up ECHO with Dr. [name].  Wife reports he has been progressively more short of breath over the past two weeks.  Notes he has not been able to walk to his garden without significant difficulty.  Denies fevers, chills, n/v/d, chest pain, chest pain with inspiration.  Reports wet cough but non-productive.  At baseline, patient has slept in a recliner for years.  Patient had difficulty walking in from parking lot to office and became significantly short of breath.  EMS activated in office, patient given lasix, and transported to ER on bipap.  Patients work of breathing improved with measures.  PCCM consulted for pulmonary assistance.    PAST MEDICAL HISTORY :  Past Medical History  Diagnosis Date  . CAD (coronary artery disease)   . PAD (peripheral artery disease)   . Hyperlipemia   . Aortic stenosis, moderate   . A-fib     not candidate for coumadin  . AAA (abdominal aortic aneurysm)     followed by Dr. [name]  . Blood transfusion   . Heart murmur   . Hypertension   . Heart attack 1989, 2002  . Hearing loss of both ears 11/13/2011  . Mitral stenosis and aortic insufficiency 11/13/2011  . Aortic valve stenosis, moderate 11/13/2011  . Chronic atrial fibrillation 11/13/2011  . Benign essential HTN  11/13/2011  . S/P coronary artery stent placement 11/13/2011  . Peripheral vascular disease, asymptomatic 11/13/2011    occluded distal right SFA by PD angiogram  . IDA (iron deficiency anemia)     gi bleed on ASA/Plavix  . Allergy   . History of radiation therapy 03/09/2013-03/27/2013    37.5 gray to right pelvis  . Basal cell carcinoma of ear 11/13/2011  . Cancer     colon  . Colon cancer dx'd 02/2011  . Carotid artery disease    Past Surgical History  Procedure Laterality Date  . Sb capsule endoscopy  11/2004    ?nonbleeding leiomyomas in distal SB  . Esophagogastroduodenoscopy  01/2005    with enteroscopy, normal through proximal jejunum  . Colonoscopy  01/2005    with terminal ileoscopy, normal exam except internal hemorrhoids  . Skin cancer removal    . Coronary artery bypass graft  2002  . Carotid endarterectomy  2006    right  . Rca otium stent  2005  . Colon surgery     Prior to Admission medications   Medication Sig Start Date End Date Taking? Authorizing Provider  atorvastatin (LIPITOR) 40 MG tablet ONE TABLET EVERY EVENING 08/01/13   [name], MD  clopidogrel (PLAVIX) 75 MG tablet Take 1 tablet (75 mg total) by mouth daily. 07/06/13   [name], MD  digoxin (LANOXIN) 0.125 MG tablet Take 1 tablet (0.125 mg  total) by mouth daily. 07/29/13   [name], MD  hydrochlorothiazide (HYDRODIURIL) 25 MG tablet Take 1 tablet (25 mg total) by mouth daily. 08/05/13   [name], MD  lansoprazole (PREVACID) 15 MG capsule Take 15 mg by mouth daily.      Historical Provider, MD  Multiple Vitamins-Minerals ([employer] SPECTRAVITE SENIOR PO) Take 1 tablet by mouth daily.      Historical Provider, MD  niacin (NIASPAN) 500 MG CR tablet Take 3 tablets (1,500 mg total) by mouth at bedtime. 06/03/13   Governor Rooks, MD  nitroGLYCERIN (NITROSTAT) 0.4 MG SL tablet Place 1 tablet (0.4 mg total) under the tongue every 5 (five) minutes as needed for chest pain. 08/05/13    [name], MD  ondansetron (ZOFRAN-ODT) 4 MG disintegrating tablet Take 1 tablet twice daily prior to chemotherapy 7 days on/7 days off. 12/22/12   Rana Snare, NP  polyethylene glycol powder [employer]) powder as needed. 06/22/11   Historical Provider, MD  potassium chloride SA (K-DUR,KLOR-CON) 20 MEQ tablet TAKE 1 TABLET (20 MEQ TOTAL) BY MOUTH DAILY. OR AS DIRECTED BY MD 07/08/13   Levert Feinstein, MD  TENORMIN 50 MG tablet Take 25 mg by mouth 2 (two) times daily.  02/11/11   Historical Provider, MD  ZETIA 10 MG tablet Take 1 tablet by mouth daily. 02/11/11   Historical Provider, MD   Allergies  Allergen Reactions  . Metoprolol Tartrate     REACTION: hives    FAMILY HISTORY:  Family History  Problem Relation Age of Onset  . Colon cancer Neg Hx   . Liver disease Neg Hx   . Inflammatory bowel disease Neg Hx   . Heart disease Mother   . Heart disease Father   . Cancer Sister     colon   SOCIAL HISTORY:  reports that he has quit smoking. He has never used smokeless tobacco. He reports that he does not drink alcohol or use illicit drugs.  REVIEW OF SYSTEMS:  See HPI.  Information obtained from wife and previous medical documentation.    SUBJECTIVE:   VITAL SIGNS: Temp:  [97.9 F (36.6 C)] 97.9 F (36.6 C) (09/18 1046) Pulse Rate:  [96-115] 96 (09/18 1106) Resp:  [22-32] 22 (09/18 1106) BP: (100-158)/(45-96) 100/45 mmHg (09/18 1106) SpO2:  [96 %-100 %] 100 % (09/18 1106)  BiPAP with 60% FiO2  INTAKE / OUTPUT: Intake/Output   None     PHYSICAL EXAMINATION: General:  Frail elderly male on bipap Neuro:  AAOx4, MAE, generalized weakness HEENT:  Mm pink/moist Cardiovascular:  s1s2 rrr, 3/6 SEM heard best 2nd ICS RSB Lungs:  resp's even/non-labored, lungs bilaterally with fine crackles Abdomen:  Round/soft, midline scar well healed, soft hernia Musculoskeletal:  No acute deformities, one leg shorter than other  Skin:  Warm/dry, 2+ pitting  edema  LABS:  CBC Recent Labs     08/13/13  1004  08/13/13  1021  WBC  11.8*   --   HGB  14.1  15.3  HCT  42.7  45.0  PLT  193   --    Coag's No results found for this basename: APTT, INR,  in the last 72 hours  BMET Recent Labs     08/13/13  1004  08/13/13  1021  NA  135  141  K  4.1  4.4  CL  99  105  CO2  21   --   BUN  17  21  CREATININE  0.84  1.00  GLUCOSE  226*  234*   Electrolytes Recent Labs     08/13/13  1004  CALCIUM  10.1   Sepsis Markers No results found for this basename: LACTICACIDVEN, PROCALCITON, O2SATVEN,  in the last 72 hours  ABG Recent Labs     08/13/13  1017  PHART  7.230*  PCO2ART  57.8*  PO2ART  295.0*   Liver Enzymes Recent Labs     08/13/13  1004  AST  52*  ALT  37  ALKPHOS  122*  BILITOT  0.8  ALBUMIN  3.8   Cardiac Enzymes No results found for this basename: TROPONINI, PROBNP,  in the last 72 hours  Glucose No results found for this basename: GLUCAP,  in the last 72 hours  Imaging Dg Chest Portable 1 View  08/13/2013   *RADIOLOGY REPORT*  Clinical Data: Shortness of breath, BiPap, initial encounter.  PORTABLE CHEST - 1 VIEW  Comparison: 09/24/2011; 04/11/2011  Findings:  Grossly unchanged enlarged cardiac silhouette and mediastinal contours.  Post median sternotomy and CABG.  Pulmonary vasculature is indistinct with cephalization of flow.  Interval development of small to moderate sized left-sided and small right-sided pleural effusions with associated perihilar and bibasilar opacities, left greater than right.  No definite pneumothorax.  Grossly unchanged bones.  IMPRESSION: Findings compatible with pulmonary edema with small to moderate- sized bilateral effusions and associated bibasilar opacities, left greater than right, likely atelectasis.   Original Report Authenticated By: Tacey Ruiz, MD    ASSESSMENT / PLAN:  PULMONARY A: Acute Respiratory Failure - in setting of CHF exacerbation, component  hypercarbia Former Tobacco Abuse P:   -bipap support, consider allowing break in afternoon if can maintain sats -f/u cxr in am -diuresis per cardiology   CARDIOVASCULAR A:  CAD AFib - not a candidate for coumadin  Acute Exacerbation of CHF P:  -rate control  -diuresis as renal fxn / BP permit -NTG gtt, reduced in ER due to decrease in BP  RENAL A:   P:   -trend sr cr with diuresis  GASTROINTESTINAL A:   NPO - in setting of respiratory distress P:   -Pepcid for SUP  HEMATOLOGIC A:   Macrocytosis  P:  -monitor H/H  INFECTIOUS A:   No evidence of acute infectious process P:   -hold abx -assess UA -assess PCT  ENDOCRINE A:   Hyperglycemia - in setting of stress response P:   -monitor CBG, hold SSI as NPO for now  NEUROLOGIC A:   Anxiety - in setting of dyspnea P:   -supportive care  GOALS of CARE Discussion with wife regarding advanced life support measures.  She indicates he would not want intubation or CPR.  Aggressive medical care up to the point of resuscitation.      PCCM will follow along with you.  Thank you for the consultation.    Canary Brim, NP-C [facility] Pgr: [phone] or [phone]  Reviewed above, and examined pt.  He has know CAD, systolic CHF, and aortic stenosis.  He has acute on chronic systolic heart failure leading to acute pulmonary edema and acute respiratory failure.  He has improvement with diuresis, NTG gtt, and NIPPV.  He and his wife are in agreement to continue medical therapies, but would not want intubation/cardiac resuscitation if clinical status gets worse.    Updated wife at bedside about plan.  CC time 35 minutes.  Coralyn Helling, MD [facility] Pulmonary/Critical Care 08/13/2013, 11:56 AM Pager:  [phone] After 3pm call: [phone]    08/13/2013, 11:23 AM

## 2013-08-13 NOTE — ED Provider Notes (Signed)
CSN: [id number]     Arrival date & time 08/13/13  6213 History   First MD Initiated Contact with Patient 08/13/13 1002     Chief Complaint  Patient presents with  . Respiratory Distress   (Consider location/radiation/quality/duration/timing/severity/associated sxs/prior Treatment) HPI This is an 75 are old male with a history of CAD, PVD, aortic stenosis, atrial fibrillation, AAA who presents with shortness of breath. Patient was seen earlier today at his vascular surgeon's office. He was noted to be short of breath with pulse ox in the 80s.  The patient had coarse breath sounds at that time it was felt to be in acute heart failure. He was given 40 mg of IV Lasix at the doctor's office. He was placed on CPAP by EMS for sats in the 80s and increased work of breathing. He presents to trauma room A with CPAP in place. Patient's blood pressure was reported to be 160 systolic in route. He is satting 100% on CPAP.  Patient was given 1 nitroglycerin en route.  Patient is unable to contribute to history taking as he take his BiPAP offshe desats into the upper 60s.  Per the patient's wife, he has had 10 days of increasing shortness of breath it waxes and wanes. He is sleeping in a recliner. The wife states that they got up this morning to get his appointment and he got acutely short of breath walking up the steps. He did not recover.  Reviewed the patient's chart reveals recent visit with Dr. Gery Pray.  Patient most recent echo showed an EF 40%. No known history of CHF. Past Medical History  Diagnosis Date  . CAD (coronary artery disease)   . PAD (peripheral artery disease)   . Hyperlipemia   . Aortic stenosis, moderate   . A-fib     not candidate for coumadin  . AAA (abdominal aortic aneurysm)     followed by Dr. [name]  . Blood transfusion   . Heart murmur   . Hypertension   . Heart attack 1989, 2002  . Hearing loss of both ears 11/13/2011  . Mitral stenosis and aortic insufficiency 11/13/2011  .  Aortic valve stenosis, moderate 11/13/2011  . Chronic atrial fibrillation 11/13/2011  . Benign essential HTN 11/13/2011  . S/P coronary artery stent placement 11/13/2011  . Peripheral vascular disease, asymptomatic 11/13/2011    occluded distal right SFA by PD angiogram  . IDA (iron deficiency anemia)     gi bleed on ASA/Plavix  . Allergy   . History of radiation therapy 03/09/2013-03/27/2013    37.5 gray to right pelvis  . Basal cell carcinoma of ear 11/13/2011  . Cancer     colon  . Colon cancer dx'd 02/2011  . Carotid artery disease    Past Surgical History  Procedure Laterality Date  . Sb capsule endoscopy  11/2004    ?nonbleeding leiomyomas in distal SB  . Esophagogastroduodenoscopy  01/2005    with enteroscopy, normal through proximal jejunum  . Colonoscopy  01/2005    with terminal ileoscopy, normal exam except internal hemorrhoids  . Skin cancer removal    . Coronary artery bypass graft  2002  . Carotid endarterectomy  2006    right  . Rca otium stent  2005  . Colon surgery     Family History  Problem Relation Age of Onset  . Colon cancer Neg Hx   . Liver disease Neg Hx   . Inflammatory bowel disease Neg Hx   . Heart disease Mother   .  Heart disease Father   . Cancer Sister     colon   History  Substance Use Topics  . Smoking status: Former Games developer  . Smokeless tobacco: Never Used     Comment: quit remotely  . Alcohol Use: No    Review of Systems  Unable to perform ROS: Severe respiratory distress   level V caveat applies  Allergies  Metoprolol tartrate  Home Medications   Current Outpatient Rx  Name  Route  Sig  Dispense  Refill  . atorvastatin (LIPITOR) 40 MG tablet   Oral   Take 40 mg by mouth daily.         . clopidogrel (PLAVIX) 75 MG tablet   Oral   Take 1 tablet (75 mg total) by mouth daily.   30 tablet   6   . digoxin (LANOXIN) 0.125 MG tablet   Oral   Take 1 tablet (0.125 mg total) by mouth daily.   30 tablet   9   .  hydrochlorothiazide (HYDRODIURIL) 25 MG tablet   Oral   Take 1 tablet (25 mg total) by mouth daily.   90 tablet   3   . lansoprazole (PREVACID) 15 MG capsule   Oral   Take 15 mg by mouth daily.           . Multiple Vitamins-Minerals ([employer] SPECTRAVITE SENIOR PO)   Oral   Take 1 tablet by mouth daily.           . niacin (NIASPAN) 500 MG CR tablet   Oral   Take 3 tablets (1,500 mg total) by mouth at bedtime.   90 tablet   9   . nitroGLYCERIN (NITROSTAT) 0.4 MG SL tablet   Sublingual   Place 1 tablet (0.4 mg total) under the tongue every 5 (five) minutes as needed for chest pain.   25 tablet   6   . polyethylene glycol powder (GLYCOLAX/MIRALAX) powder   Oral   Take 17 g by mouth daily as needed (for constipation).          . potassium chloride SA (K-DUR,KLOR-CON) 20 MEQ tablet   Oral   Take 20 mEq by mouth daily.         . TENORMIN 50 MG tablet   Oral   Take 25 mg by mouth 2 (two) times daily.          Marland Kitchen ZETIA 10 MG tablet   Oral   Take 1 tablet by mouth daily.          BP 101/57  Pulse 85  Temp(Src) 97.9 F (36.6 C) (Rectal)  Resp 22  SpO2 100% Physical Exam  Nursing note and vitals reviewed. Constitutional: He is oriented to person, place, and time.  Elderly, frail, chronically ill-appearing, tachypnic, CPAP in place  HENT:  Head: Normocephalic and atraumatic.  Eyes: Pupils are equal, round, and reactive to light.  Conjunctiva injected  Neck: Neck supple. JVD present.  Cardiovascular:  No murmur heard. Irregular rhythm, Tachycardia, distant heart sounds, systolic murmur noted  Pulmonary/Chest:  Increased respiratory effort with abdominal breathing, CPAP and place, coarse breath sounds bilaterally with crackles in [place] noted Midline chest and abdominal scar noted  Abdominal: Soft. Bowel sounds are normal. There is no tenderness. There is no rebound.  Musculoskeletal: He exhibits edema.  Right greater than left lower remedy edema, 2+   Lymphadenopathy:    He has no cervical adenopathy.  Neurological: He is alert and oriented to person, place, and time.  Moves all 4 extremities  Skin:  Cool clammy  Psychiatric: He has a normal mood and affect.    ED Course  Procedures (including critical care time) Labs Review Labs Reviewed  CBC - Abnormal; Notable for the following:    WBC 11.8 (*)    RBC 4.18 (*)    MCV 102.2 (*)    All other components within normal limits  COMPREHENSIVE METABOLIC PANEL - Abnormal; Notable for the following:    Glucose, Bld 226 (*)    AST 52 (*)    Alkaline Phosphatase 122 (*)    GFR calc non Af Amer 78 (*)    All other components within normal limits  DIGOXIN LEVEL - Abnormal; Notable for the following:    Digoxin Level 0.7 (*)    All other components within normal limits  POCT I-STAT 3, BLOOD GAS (G3+) - Abnormal; Notable for the following:    pH, Arterial 7.230 (*)    pCO2 arterial 57.8 (*)    pO2, Arterial 295.0 (*)    Bicarbonate 24.2 (*)    Acid-base deficit 4.0 (*)    All other components within normal limits  POCT I-STAT, CHEM 8 - Abnormal; Notable for the following:    Glucose, Bld 234 (*)    All other components within normal limits  URINALYSIS, ROUTINE W REFLEX MICROSCOPIC  PRO B NATRIURETIC PEPTIDE  PROCALCITONIN  POCT I-STAT TROPONIN I   Imaging Review Dg Chest Portable 1 View  08/13/2013   *RADIOLOGY REPORT*  Clinical Data: Shortness of breath, BiPap, initial encounter.  PORTABLE CHEST - 1 VIEW  Comparison: 09/24/2011; 04/11/2011  Findings:  Grossly unchanged enlarged cardiac silhouette and mediastinal contours.  Post median sternotomy and CABG.  Pulmonary vasculature is indistinct with cephalization of flow.  Interval development of small to moderate sized left-sided and small right-sided pleural effusions with associated perihilar and bibasilar opacities, left greater than right.  No definite pneumothorax.  Grossly unchanged bones.  IMPRESSION: Findings compatible  with pulmonary edema with small to moderate- sized bilateral effusions and associated bibasilar opacities, left greater than right, likely atelectasis.   Original Report Authenticated By: Tacey Ruiz, MD   CRITICAL CARE Performed by: Ross Marcus, F   Total critical care time: 40 minutes  Critical care time was exclusive of separately billable procedures and treating other patients.  Critical care was necessary to treat or prevent imminent or life-threatening deterioration.  Critical care was time spent personally by me on the following activities: development of treatment plan with patient and/or surrogate as well as nursing, discussions with consultants, evaluation of patient's response to treatment, examination of patient, obtaining history from patient or surrogate, ordering and performing treatments and interventions, ordering and review of laboratory studies, ordering and review of radiographic studies, pulse oximetry and re-evaluation of patient's condition.  EKG independently reviewed by myself: Atrial fibrillation with a rate of 108, no evidence of ST elevation or ischemia, Q waves in the anterior leads unchanged from prior  MDM   1. Pulmonary edema, acute   2. Aortic stenosis, moderate   3. Chronic atrial fibrillation   4. S/P CABG x 5   5. CHF (congestive heart failure), acute on chronic, systolic   6. Acute respiratory distress   7. Acute decompensated heart failure   8. Pulmonary edema    This Is an 61 are old gentleman who presents in respiratory distress from his vascular surgeon's office. Patient acutely decompensates with removal of CPAP. Pulse ox is 100% with CPAP  in place. A nitroglycerin drip was started at 5.  Chest x-ray shows acute pulmonary edema with bilateral effusions. Patient is afebrile at this time have low suspicion for pneumonia given the acuity of his respiratory distress. Both critical care and cardiology were called to evaluate the patient. He will  be admitted to the cardiology service for further management of his acute decompensated heart failure.    Shon Baton, MD 08/13/13 1141

## 2013-08-13 NOTE — Assessment & Plan Note (Signed)
Myoview low risk 08/09/13

## 2013-08-13 NOTE — ED Notes (Signed)
Ultrasound being performed by ED MD.

## 2013-08-13 NOTE — ED Notes (Signed)
Verbal order received from Critical Care PA to lower dose of Nitro to 2.76mcg d/t low BP.

## 2013-08-13 NOTE — Assessment & Plan Note (Signed)
Chemo Rx- was to start radiation RX

## 2013-08-13 NOTE — Progress Notes (Signed)
Pt transported to 2H02 on BIPAP via Ventilator.

## 2013-08-13 NOTE — Assessment & Plan Note (Signed)
Not on anticoagulation 

## 2013-08-14 ENCOUNTER — Encounter: Payer: Self-pay | Admitting: Cardiology

## 2013-08-14 ENCOUNTER — Inpatient Hospital Stay (HOSPITAL_COMMUNITY): Payer: Medicare Other

## 2013-08-14 DIAGNOSIS — C182 Malignant neoplasm of ascending colon: Secondary | ICD-10-CM

## 2013-08-14 DIAGNOSIS — I2589 Other forms of chronic ischemic heart disease: Secondary | ICD-10-CM

## 2013-08-14 LAB — BASIC METABOLIC PANEL
CO2: 29 mEq/L (ref 19–32)
Glucose, Bld: 154 mg/dL — ABNORMAL HIGH (ref 70–99)
Potassium: 3.1 mEq/L — ABNORMAL LOW (ref 3.5–5.1)
Sodium: 140 mEq/L (ref 135–145)

## 2013-08-14 LAB — PROCALCITONIN: Procalcitonin: 0.19 ng/mL

## 2013-08-14 MED ORDER — POTASSIUM CHLORIDE CRYS ER 20 MEQ PO TBCR
40.0000 meq | EXTENDED_RELEASE_TABLET | Freq: Once | ORAL | Status: AC
  Administered 2013-08-14: 40 meq via ORAL
  Filled 2013-08-14: qty 2

## 2013-08-14 MED ORDER — FAMOTIDINE 20 MG PO TABS
20.0000 mg | ORAL_TABLET | Freq: Every day | ORAL | Status: DC
  Administered 2013-08-14 – 2013-08-17 (×4): 20 mg via ORAL
  Filled 2013-08-14 (×5): qty 1

## 2013-08-14 NOTE — Progress Notes (Signed)
Pharmacist Heart Failure Core Measure Documentation  Assessment: Charles Faulkner has an EF documented as 35-30% on 08/13/13 by Echo.  Rationale: Heart failure patients with left ventricular systolic dysfunction (LVSD) and an EF < 40% should be prescribed an angiotensin converting enzyme inhibitor (ACEI) or angiotensin receptor blocker (ARB) at discharge unless a contraindication is documented in the medical record.  This patient is not currently on an ACEI or ARB for HF.  This note is being placed in the record in order to provide documentation that a contraindication to the use of these agents is present for this encounter.  ACE Inhibitor or Angiotensin Receptor Blocker is contraindicated (specify all that apply)  []   ACEI allergy AND ARB allergy []   Angioedema [x]   Moderate or severe aortic stenosis []   Hyperkalemia []   Hypotension []   Renal artery stenosis []   Worsening renal function, preexisting renal disease or dysfunction  Harland German, Pharm D 08/14/2013 2:44 PM

## 2013-08-14 NOTE — Progress Notes (Signed)
PULMONARY  / CRITICAL CARE MEDICINE  Name: Charles Faulkner MRN: [id number] DOB: 06/23/1926    ADMISSION DATE:  08/13/2013 CONSULTATION DATE:  08/14/13  REFERRING MD :  EDP - Dr. Wilkie Aye PRIMARY SERVICE:  [facility]  CHIEF COMPLAINT:  SOB  BRIEF PATIENT DESCRIPTION: 79 y/o male admitted with CHF exacerbation requiring bipap, 9/18. PCCM consulted for pulmonary assistance.  SIGNIFICANT EVENTS / STUDIES:  9/18 - Admit from [facility] office with CHF exacerbation 9/18 - LVEF 25-30%, probable Aortic Stenosis  LINES / TUBES: PIV 9/18 >>>  CULTURES: MRSA by PCR 9/18 >>> negative  SUBJECTIVE:  Off BiPAP and not needing oxygen.  Breathing much better.  Denies chest pain, or cough.  VITAL SIGNS: Temp:  [96.7 F (35.9 C)-98.3 F (36.8 C)] 98.3 F (36.8 C) (09/19 0800) Pulse Rate:  [36-115] 79 (09/19 0800) Resp:  [19-32] 28 (09/19 0800) BP: (92-161)/(28-96) 161/72 mmHg (09/19 0800) SpO2:  [77 %-100 %] 95 % (09/19 0800) FiO2 (%):  [50 %] 50 % (09/18 1126) Weight:  [57.9 kg (127 lb 10.3 oz)-62.1 kg (136 lb 14.5 oz)] 57.9 kg (127 lb 10.3 oz) (09/19 0500) Room Air  PHYSICAL EXAMINATION: General:  Pleasant, frail elderly male, sitting up in bed, NAD Neuro:  A & O x 3 HEENT:  Atraumatic, MM pink/moist, bilateral ectropic lower lids Cardiovascular: irregularly irregular, 3/6 SEM best heard 2nd ICS RSB Lungs:  Bilateral CTA, decreased breathe sounds bilateral bases Abdomen: soft, NT/ND, +BS Musculoskeletal:  No acute deformities  Skin:  Warm/dry, lower extremity edema resolved   Recent Labs Lab 08/13/13 1004 08/13/13 1021 08/13/13 1347 08/14/13 0130  NA 135 141  --  140  K 4.1 4.4  --  3.1*  CL 99 105  --  103  CO2 21  --   --  29  BUN 17 21  --  17  CREATININE 0.84 1.00 0.83 0.82  GLUCOSE 226* 234*  --  154*    Recent Labs Lab 08/13/13 1004 08/13/13 1021 08/13/13 1347  HGB 14.1 15.3 13.7  HCT 42.7 45.0 39.1  WBC 11.8*  --  13.2*  PLT 193  --  132*   Dg Chest Port 1  View  08/14/2013   CLINICAL DATA:  Respiratory failure.  EXAM: PORTABLE CHEST - 1 VIEW  COMPARISON:  08/13/2013.  FINDINGS: Pulmonary edema with left-sided pleural effusion. Consolidation left base may be related to pleural effusion and atelectasis although limiting evaluation for the possibility of underlying infiltrate or mass.  Cardiomegaly.  Calcified mildly tortuous aorta.  IMPRESSION: Pulmonary edema with minimal improvement in aeration left upper lung zone.  Consolidation left base remains where pleural effusion is noted as detailed above.  Cardiomegaly.  Calcified tortuous aorta   Electronically Signed   By: Bridgett Larsson   On: 08/14/2013 07:50   Dg Chest Portable 1 View  08/13/2013   *RADIOLOGY REPORT*  Clinical Data: Shortness of breath, BiPap, initial encounter.  PORTABLE CHEST - 1 VIEW  Comparison: 09/24/2011; 04/11/2011  Findings:  Grossly unchanged enlarged cardiac silhouette and mediastinal contours.  Post median sternotomy and CABG.  Pulmonary vasculature is indistinct with cephalization of flow.  Interval development of small to moderate sized left-sided and small right-sided pleural effusions with associated perihilar and bibasilar opacities, left greater than right.  No definite pneumothorax.  Grossly unchanged bones.  IMPRESSION: Findings compatible with pulmonary edema with small to moderate- sized bilateral effusions and associated bibasilar opacities, left greater than right, likely atelectasis.   Original Report Authenticated By:  Tacey Ruiz, MD    ASSESSMENT / PLAN:   PULMONARY  A:  Acute Respiratory Failure - in setting of CHF exacerbation, component hypercarbia, much improved- 95% pO2 on RA, 1L urine output in last 24 hours Former Tobacco Abuse  P:  -negative fluid balance per cardiology -oxygen as needed -d/c BiPAP  CARDIOVASCULAR  A:  CAD  AFib - not a candidate for coumadin  Acute on chronic Exacerbation of systolic CH Aortic stenosis P:  -per  cardiology  INFECTIOUS  A:  No evidence of acute infectious process, UA and PCT negative 9/18 P:  -hold abx   ENDOCRINE  A:  Hyperglycemia - in setting of stress response  Hypokalemia P:  -monitor CBG -SSI -replete K+  NEUROLOGIC  A:  Anxiety - in setting of dyspnea  P:  -supportive care   GOALS of CARE  Discussion with wife regarding advanced life support measures. She indicates he would not want intubation or CPR. Aggressive medical care up to the point of resuscitation.    Respiratory status much improved.  No longer needing BiPAP.  PCCM will sign off.  Please call if additional help needed.  Coralyn Helling, MD [facility] Pulmonary/Critical Care 08/14/2013, 11:31 AM Pager:  [phone] After 3pm call: [phone]

## 2013-08-14 NOTE — Progress Notes (Signed)
[facility]  Subjective: Breathing much better today. Denies CP.  Objective: Vital signs in last 24 hours: Temp:  [96.7 F (35.9 C)-98 F (36.7 C)] 98 F (36.7 C) (09/19 0400) Pulse Rate:  [36-115] 36 (09/19 0700) Resp:  [19-32] 21 (09/19 0700) BP: (92-158)/(28-96) 108/44 mmHg (09/19 0700) SpO2:  [77 %-100 %] 100 % (09/19 0700) FiO2 (%):  [50 %] 50 % (09/18 1126) Weight:  [127 lb 10.3 oz (57.9 kg)-136 lb 14.5 oz (62.1 kg)] 127 lb 10.3 oz (57.9 kg) (09/19 0500)    Intake/Output from previous day: 09/18 0701 - 09/19 0700 In: 422.9 [P.O.:320; I.V.:52.9; IV Piggyback:50] Out: 1550 [Urine:1550] Intake/Output this shift:    Medications Current Facility-Administered Medications  Medication Dose Route Frequency Provider Last Rate Last Dose  . 0.9 %  sodium chloride infusion  250 mL Intravenous PRN Wilburt Finlay, PA-C   250 mL at 08/13/13 1300  . acetaminophen (TYLENOL) tablet 650 mg  650 mg Oral Q4H PRN Wilburt Finlay, PA-C      . albuterol (PROVENTIL) (5 MG/ML) 0.5% nebulizer solution 2.5 mg  2.5 mg Nebulization Q3H PRN Bernadene Person, NP      . atenolol (TENORMIN) tablet 25 mg  25 mg Oral BID Wilburt Finlay, PA-C   25 mg at 08/13/13 1453  . atorvastatin (LIPITOR) tablet 40 mg  40 mg Oral q1800 Wilburt Finlay, PA-C   40 mg at 08/13/13 1739  . clopidogrel (PLAVIX) tablet 75 mg  75 mg Oral Q breakfast Wilburt Finlay, PA-C   75 mg at 08/14/13 0741  . digoxin (LANOXIN) tablet 0.125 mg  0.125 mg Oral Daily Wilburt Finlay, PA-C   0.125 mg at 08/13/13 1452  . enoxaparin (LOVENOX) injection 40 mg  40 mg Subcutaneous Q24H Wilburt Finlay, PA-C   40 mg at 08/13/13 1454  . ezetimibe (ZETIA) tablet 10 mg  10 mg Oral Daily Wilburt Finlay, PA-C   10 mg at 08/13/13 1452  . famotidine (PEPCID) IVPB 20 mg  20 mg Intravenous Q24H Bernadene Person, NP   20 mg at 08/13/13 1452  . furosemide (LASIX) injection 40 mg  40 mg Intravenous BID Wilburt Finlay, PA-C   40 mg at 08/14/13 0741  .  influenza vac split quadrivalent PF (FLUARIX) injection 0.5 mL  0.5 mL Intramuscular Tomorrow-1000 Lennette Bihari, MD      . nitroGLYCERIN 0.2 mg/mL in dextrose 5 % infusion  5 mcg/min Intravenous Titrated Shon Baton, MD   2.5 mcg/min at 08/13/13 1107  . ondansetron (ZOFRAN) injection 4 mg  4 mg Intravenous Q6H PRN Wilburt Finlay, PA-C      . pantoprazole (PROTONIX) EC tablet 40 mg  40 mg Oral Q breakfast Wilburt Finlay, PA-C   40 mg at 08/14/13 0741  . pneumococcal 23 valent vaccine (PNU-IMMUNE) injection 0.5 mL  0.5 mL Intramuscular Tomorrow-1000 Lennette Bihari, MD      . polyethylene glycol (MIRALAX / GLYCOLAX) packet 17 g  17 g Oral Daily Lennette Bihari, MD      . sodium chloride 0.9 % injection 3 mL  3 mL Intravenous Q12H Wilburt Finlay, PA-C   3 mL at 08/13/13 2140  . sodium chloride 0.9 % injection 3 mL  3 mL Intravenous PRN Wilburt Finlay, PA-C        PE: General appearance: alert, cooperative and no distress Lungs: clear to auscultation bilaterally Heart: irregularly irregular rhythm and 3/6 AS murmur Abd: BS+ Extremities: trace-1+ LEE R>L Pulses: 2+  and symmetric Skin: warm and dry Neurologic: Grossly normal  Lab Results:   Recent Labs  08/13/13 1004 08/13/13 1021 08/13/13 1347  WBC 11.8*  --  13.2*  HGB 14.1 15.3 13.7  HCT 42.7 45.0 39.1  PLT 193  --  132*   BMET  Recent Labs  08/13/13 1004 08/13/13 1021 08/13/13 1347 08/14/13 0130  NA 135 141  --  140  K 4.1 4.4  --  3.1*  CL 99 105  --  103  CO2 21  --   --  29  GLUCOSE 226* 234*  --  154*  BUN 17 21  --  17  CREATININE 0.84 1.00 0.83 0.82  CALCIUM 10.1  --   --  9.0   Cardiac Panel (last 3 results)  Recent Labs  08/13/13 1347 08/13/13 1925 08/14/13 0130  TROPONINI <0.30 0.42* <0.30   BNP (last 3 results)  Recent Labs  08/13/13 1137  PROBNP 11820.0*   Assessment/Plan  Principal Problem:   Pulmonary edema, acute Active Problems:   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 3rd troponin was negative. He denies any chest pain. Breathing has improved. Lungs are CTAB and LEE has improved. He is off of BiPap. 94% O2 sats on RA. ? Diagnostic LHC vs medical therapy. HR and BP are currently both stable, although he did have some bradycardia overnight. Will replete K+. MD to follow.     LOS: 1 day    Brittainy M. Delmer Islam 08/14/2013 7:45 AM  Echo from office yesterday: Study Conclusions  - Left ventricle: The cavity size was mildly dilated. There was mild concentric hypertrophy. Systolic function was severely reduced. The estimated ejection fraction was in the range of 25% to 30%. LV mid-ventricular false tendon. Diffuse hypokinesis. There is Stage 3 (restrictive) physiology. The E/e' ratio is 20, suggesting markedly elevated LV filling pressure. The E/A ratio is >3. The MV decel time is 127 msec. - Aortic valve: Moderately calcified leaflets. There is mild to moderate regurgitation. - Mitral valve: Calcified annulus. There is tethering of the posterior leaflet. Moderate to severe regurgitation is present. - Left atrium: Moderate to severely dilated. - Tricuspid valve: There appears to be a partially flail subvalvular cord. No prolapse of the tricuspid leaflets is noted. TR Color doppler was not obtained due to patient factors. - Pericardium, extracardiac: There appeared to be a large left pleural effusion. - Impressions: The exam was discontinued due to marked dyspnea and hypoxia. Although aortic valve dopplers were not obtained, there appears to be at least moderate aortic stenosis visually.   Patient seen and examined. Agree with assessment and plan. Feels remarkedly better. BNP  yesterday 11820. Good response to Bi-PAP yesterday. -1127 yesterday. Received Lasix 40 mg IV this am. EF 25 - 30% with probable moderate AS. AF rate controlled. Stable hemodynamics. O2 sat 96%. Will keep on 2900 today but can move to TCU bed.   Lennette Bihari, MD, [facility] 08/14/2013 9:18 AM

## 2013-08-14 NOTE — Progress Notes (Signed)
eLink Physician-Brief Progress Note Patient Name: Charles Faulkner DOB: 1926-09-09 MRN: [id number]  Date of Service  08/14/2013   HPI/Events of Note  Hypokalemia   eICU Interventions  Potassium replaced   Intervention Category Minor Interventions: Electrolytes abnormality - evaluation and management  DETERDING,ELIZABETH 08/14/2013, 2:49 AM

## 2013-08-15 LAB — CBC
HCT: 38.6 % — ABNORMAL LOW (ref 39.0–52.0)
MCV: 99.2 fL (ref 78.0–100.0)
Platelets: 171 10*3/uL (ref 150–400)
RBC: 3.89 MIL/uL — ABNORMAL LOW (ref 4.22–5.81)
WBC: 8.3 10*3/uL (ref 4.0–10.5)

## 2013-08-15 LAB — PRO B NATRIURETIC PEPTIDE: Pro B Natriuretic peptide (BNP): 12364 pg/mL — ABNORMAL HIGH (ref 0–450)

## 2013-08-15 LAB — BASIC METABOLIC PANEL
CO2: 22 mEq/L (ref 19–32)
Calcium: 10.1 mg/dL (ref 8.4–10.5)
Chloride: 101 mEq/L (ref 96–112)
GFR calc Af Amer: >90 mL/min (ref >90)
Sodium: 138 mEq/L (ref 135–145)

## 2013-08-15 MED ORDER — DIGOXIN 0.0625 MG HALF TABLET: 0.0625 mg | ORAL_TABLET | Freq: Every day | ORAL | Status: DC

## 2013-08-15 MED ORDER — ATENOLOL 12.5 MG HALF TABLET
12.5000 mg | ORAL_TABLET | Freq: Two times a day (BID) | ORAL | Status: DC
  Administered 2013-08-15 – 2013-08-18 (×6): 12.5 mg via ORAL
  Filled 2013-08-15 (×8): qty 1

## 2013-08-15 MED ORDER — DIGOXIN 0.0625 MG HALF TABLET
0.0625 mg | ORAL_TABLET | Freq: Every day | ORAL | Status: DC
  Administered 2013-08-16 – 2013-08-18 (×3): 0.0625 mg via ORAL
  Filled 2013-08-15 (×4): qty 1

## 2013-08-15 MED ORDER — ATENOLOL 12.5 MG HALF TABLET
12.5000 mg | ORAL_TABLET | Freq: Two times a day (BID) | ORAL | Status: DC
  Filled 2013-08-15: qty 1

## 2013-08-15 MED ORDER — FUROSEMIDE 40 MG PO TABS
40.0000 mg | ORAL_TABLET | Freq: Every day | ORAL | Status: DC
  Administered 2013-08-15 – 2013-08-18 (×4): 40 mg via ORAL
  Filled 2013-08-15 (×4): qty 1

## 2013-08-15 NOTE — Progress Notes (Signed)
[facility] Progress Note  Subjective:  Breathing better;  Objective:   Vital Signs in the last 24 hours: Temp:  [98.4 F (36.9 C)-98.8 F (37.1 C)] 98.7 F (37.1 C) (09/20 0800) Pulse Rate:  [50-65] 50 (09/20 1000) Resp:  [18-28] 18 (09/20 0800) BP: (113-174)/(44-70) 148/59 mmHg (09/20 0800) SpO2:  [94 %-98 %] 94 % (09/20 0800) Weight:  [125 lb 3.5 oz (56.8 kg)] 125 lb 3.5 oz (56.8 kg) (09/20 0430)  Intake/Output from previous day: 09/19 0701 - 09/20 0700 In: 410 [P.O.:410] Out: 2075 [Urine:2075]  Scheduled: . atenolol  25 mg Oral BID  . atorvastatin  40 mg Oral q1800  . clopidogrel  75 mg Oral Q breakfast  . digoxin  0.125 mg Oral Daily  . enoxaparin (LOVENOX) injection  40 mg Subcutaneous Q24H  . ezetimibe  10 mg Oral Daily  . famotidine  20 mg Oral QHS  . furosemide  40 mg Intravenous BID  . pantoprazole  40 mg Oral Q breakfast  . polyethylene glycol  17 g Oral Daily  . sodium chloride  3 mL Intravenous Q12H   . nitroGLYCERIN Stopped (08/13/13 1500)    Physical Exam:   General appearance: alert, cooperative and no distress Neck: no adenopathy, no JVD, supple, symmetrical, trachea midline and thyroid not enlarged, symmetric, no tenderness/mass/nodules Lungs: no whezing; BS good, slightly decreased at base Heart: irregularly irregular rhythm 2/6 AS murmur Abdomen: soft, non-tender; bowel sounds normal; no masses,  no organomegaly Extremities: no edema, redness or tenderness in the calves or thighs   Rate: 48 - 70's  Rhythm: atrial fibrillation  Lab Results:    Recent Labs  08/14/13 0130 08/15/13 0530  NA 140 138  K 3.1* 3.6  CL 103 101  CO2 29 22  GLUCOSE 154* 108*  BUN 17 16  CREATININE 0.82 0.77    Recent Labs  08/13/13 1925 08/14/13 0130  TROPONINI 0.42* <0.30   Hepatic Function Panel  Recent Labs  08/13/13 1004  PROT 7.4  ALBUMIN 3.8  AST 52*  ALT 37  ALKPHOS 122*  BILITOT 0.8   No results found  for this basename: INR,  in the last 72 hours BNP (last 3 results)  Recent Labs  08/13/13 1137 08/15/13 0530  PROBNP 11820.0* 12364.0*   Lipid Panel  Lipid Panel     Component Value Date/Time   CHOL 68 04/16/2011 0615   TRIG  Value: 63 CORRECTED ON 05/21 AT 0927: PREVIOUSLY REPORTED AS A 04/16/2011 0615      Imaging:  Dg Chest Port 1 View  08/14/2013   CLINICAL DATA:  Respiratory failure.  EXAM: PORTABLE CHEST - 1 VIEW  COMPARISON:  08/13/2013.  FINDINGS: Pulmonary edema with left-sided pleural effusion. Consolidation left base may be related to pleural effusion and atelectasis although limiting evaluation for the possibility of underlying infiltrate or mass.  Cardiomegaly.  Calcified mildly tortuous aorta.  IMPRESSION: Pulmonary edema with minimal improvement in aeration left upper lung zone.  Consolidation left base remains where pleural effusion is noted as detailed above.  Cardiomegaly.  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 I/O -2752 since admission.  Cr 0.77. Will change dig to 0.0625 mg and change atenolol to 12.5 mg bid rather than 25 mg daily due to HR dropping into the 40's.  Will change lasix to orally. Begin to attempt ambulation as tolerates.   Lennette Bihari, MD, [facility] 08/15/2013, 10:25 AM

## 2013-08-16 ENCOUNTER — Inpatient Hospital Stay (HOSPITAL_COMMUNITY): Payer: Medicare Other

## 2013-08-16 DIAGNOSIS — J811 Chronic pulmonary edema: Secondary | ICD-10-CM

## 2013-08-16 DIAGNOSIS — I10 Essential (primary) hypertension: Secondary | ICD-10-CM

## 2013-08-16 LAB — BASIC METABOLIC PANEL
Calcium: 9.7 mg/dL (ref 8.4–10.5)
GFR calc non Af Amer: 77 mL/min — ABNORMAL LOW (ref >90)
Glucose, Bld: 115 mg/dL — ABNORMAL HIGH (ref 70–99)
Potassium: 3.6 mEq/L (ref 3.5–5.1)
Sodium: 137 mEq/L (ref 135–145)

## 2013-08-16 LAB — PRO B NATRIURETIC PEPTIDE: Pro B Natriuretic peptide (BNP): 7425 pg/mL — ABNORMAL HIGH (ref 0–450)

## 2013-08-16 MED ORDER — LOSARTAN POTASSIUM 25 MG PO TABS
25.0000 mg | ORAL_TABLET | Freq: Every day | ORAL | Status: DC
  Administered 2013-08-16 – 2013-08-18 (×3): 25 mg via ORAL
  Filled 2013-08-16 (×3): qty 1

## 2013-08-16 MED ORDER — SPIRONOLACTONE 12.5 MG HALF TABLET
12.5000 mg | ORAL_TABLET | Freq: Every day | ORAL | Status: DC
  Administered 2013-08-16 – 2013-08-18 (×3): 12.5 mg via ORAL
  Filled 2013-08-16 (×3): qty 1

## 2013-08-16 MED ORDER — POTASSIUM CHLORIDE CRYS ER 20 MEQ PO TBCR
40.0000 meq | EXTENDED_RELEASE_TABLET | Freq: Two times a day (BID) | ORAL | Status: DC
  Administered 2013-08-16 (×2): 40 meq via ORAL
  Filled 2013-08-16 (×3): qty 2

## 2013-08-16 NOTE — Progress Notes (Signed)
Heart failure information and booklet given to patient, patient and his wife educated on information; will continue to monitor patient. Lorretta Harp RN

## 2013-08-16 NOTE — Progress Notes (Signed)
Patient has arrived to unit, report received from nurse Britta Mccreedy on unit 2H, CMT notified, vital signs stable, no complaints of pain; will continue to monitor patient. Lorretta Harp RN

## 2013-08-16 NOTE — Progress Notes (Addendum)
The [facility]  Subjective: Feeling better. No SOB at rest.   Objective: Vital signs in last 24 hours: Temp:  [97.6 F (36.4 C)-98.3 F (36.8 C)] 97.6 F (36.4 C) (09/21 0354) Pulse Rate:  [50-82] 82 (09/20 1328) Resp:  [19-24] 24 (09/21 0354) BP: (90-137)/(42-84) 122/57 mmHg (09/21 0354) SpO2:  [95 %-96 %] 96 % (09/21 0354) Weight:  [123 lb 0.3 oz (55.8 kg)] 123 lb 0.3 oz (55.8 kg) (09/21 0500) Last BM Date: 08/15/13  Intake/Output from previous day: 09/20 0701 - 09/21 0700 In: 600 [P.O.:600] Out: 1025 [Urine:1025] Intake/Output this shift:    Medications Current Facility-Administered Medications  Medication Dose Route Frequency Provider Last Rate Last Dose  . 0.9 %  sodium chloride infusion  250 mL Intravenous PRN Wilburt Finlay, PA-C   250 mL at 08/13/13 1300  . acetaminophen (TYLENOL) tablet 650 mg  650 mg Oral Q4H PRN Wilburt Finlay, PA-C      . albuterol (PROVENTIL) (5 MG/ML) 0.5% nebulizer solution 2.5 mg  2.5 mg Nebulization Q3H PRN Bernadene Person, NP      . atenolol (TENORMIN) tablet 12.5 mg  12.5 mg Oral BID Lennette Bihari, MD   12.5 mg at 08/15/13 1328  . atorvastatin (LIPITOR) tablet 40 mg  40 mg Oral q1800 Wilburt Finlay, PA-C   40 mg at 08/15/13 1709  . clopidogrel (PLAVIX) tablet 75 mg  75 mg Oral Q breakfast Wilburt Finlay, PA-C   75 mg at 08/15/13 0933  . digoxin (LANOXIN) tablet 0.0625 mg  0.0625 mg Oral Daily Lennette Bihari, MD      . enoxaparin (LOVENOX) injection 40 mg  40 mg Subcutaneous Q24H Wilburt Finlay, PA-C   40 mg at 08/15/13 1332  . ezetimibe (ZETIA) tablet 10 mg  10 mg Oral Daily Wilburt Finlay, PA-C   10 mg at 08/15/13 1054  . famotidine (PEPCID) tablet 20 mg  20 mg Oral QHS Benny Lennert, RPH   20 mg at 08/15/13 2226  . furosemide (LASIX) tablet 40 mg  40 mg Oral Daily Lennette Bihari, MD   40 mg at 08/15/13 1328  . nitroGLYCERIN 0.2 mg/mL in dextrose 5 % infusion  5 mcg/min Intravenous Titrated Shon Baton, MD   2.5  mcg/min at 08/13/13 1107  . ondansetron (ZOFRAN) injection 4 mg  4 mg Intravenous Q6H PRN Wilburt Finlay, PA-C      . pantoprazole (PROTONIX) EC tablet 40 mg  40 mg Oral Q breakfast Wilburt Finlay, PA-C   40 mg at 08/15/13 0933  . polyethylene glycol (MIRALAX / GLYCOLAX) packet 17 g  17 g Oral Daily Lennette Bihari, MD      . sodium chloride 0.9 % injection 3 mL  3 mL Intravenous Q12H Wilburt Finlay, PA-C   3 mL at 08/15/13 2227  . sodium chloride 0.9 % injection 3 mL  3 mL Intravenous PRN Wilburt Finlay, PA-C        PE: General appearance: alert, cooperative and no distress Lungs: decreased breath sounds at bilateral bases. No rales Heart: regular rate and rhythm and 3/6 SM Extremities: no LEE Pulses: 2+ and symmetric Skin: warm and dry Neurologic: Grossly normal  Lab Results:   Recent Labs  08/13/13 1004 08/13/13 1021 08/13/13 1347 08/15/13 0530  WBC 11.8*  --  13.2* 8.3  HGB 14.1 15.3 13.7 13.3  HCT 42.7 45.0 39.1 38.6*  PLT 193  --  132* 171   BMET  Recent Labs  08/14/13 0130 08/15/13 0530 08/16/13 0505  NA 140 138 137  K 3.1* 3.6 3.6  CL 103 101 101  CO2 29 22 27   GLUCOSE 154* 108* 115*  BUN 17 16 18   CREATININE 0.82 0.77 0.88  CALCIUM 9.0 10.1 9.7    BNP (last 3 results)  Recent Labs  08/13/13 1137 08/15/13 0530 08/16/13 0505  PROBNP 11820.0* 12364.0* 7425.0*   Filed Weights   08/14/13 0500 08/15/13 0430 08/16/13 0500  Weight: 127 lb 10.3 oz (57.9 kg) 125 lb 3.5 oz (56.8 kg) 123 lb 0.3 oz (55.8 kg)   Assessment/Plan  Principal Problem:   Pulmonary edema, acute Active Problems:   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 He has put out 3.2 L since admission. Peripheral  edema has resolved. He is now on PO Lasix. SCr is WNL. Electrolytes are WNL. Will place PT consult today to help pt ambulate and to see if he has any DOE. Likely can be discharged soon.    LOS: 3 days    Brittainy M. Sharol Harness, PA-C 08/16/2013 8:16 AM  I have seen & examined the patient this AM along with Ms. Sharol Harness, [place].  I agree with her findings, PE & overall recommendations.  Overall ~ 3L diuresis. Pro BNP reduced by 1/2 & converted to PO Lasix.  BB dose reduced for bradycardia along with digoxin  Is not currently on afterload reduction - will add low dose ARB  Add Spironolactone @ low dose.  K is a bit low today -- replete On statin. On DAPT for h/o PCI  Can transfer out of TCU today. Anticipate d/c in next 1-2 days -- agree with PT/OT eval for d/c planning.  Marykay Lex, MD

## 2013-08-17 LAB — BASIC METABOLIC PANEL
Calcium: 9.8 mg/dL (ref 8.4–10.5)
Chloride: 105 mEq/L (ref 96–112)
Creatinine, Ser: 0.77 mg/dL (ref 0.50–1.35)
GFR calc Af Amer: >90 mL/min (ref >90)
Sodium: 139 mEq/L (ref 135–145)

## 2013-08-17 NOTE — Progress Notes (Signed)
Subjective:  Better today. Up to BR yesterday.  Objective:  Vital Signs in the last 24 hours: Temp:  [97.5 F (36.4 C)-99.1 F (37.3 C)] 97.5 F (36.4 C) (09/22 0605) Pulse Rate:  [70-85] 85 (09/22 0605) Resp:  [16-20] 20 (09/22 0605) BP: (108-137)/(53-75) 137/75 mmHg (09/22 0605) SpO2:  [96 %-98 %] 96 % (09/22 0605) Weight:  [120 lb 9.5 oz (54.7 kg)-122 lb 1.6 oz (55.384 kg)] 120 lb 9.5 oz (54.7 kg) (09/22 0605)  Intake/Output from previous day:  Intake/Output Summary (Last 24 hours) at 08/17/13 1014 Last data filed at 08/17/13 2956  Gross per 24 hour  Intake    123 ml  Output    550 ml  Net   -427 ml    Physical Exam: General appearance: alert, cooperative, cachectic and no distress Lungs: few crackles Heart: irregularly irregular rhythm and 2/6 systolic murmur   Rate 80  Rhythm: atrial fibrillation  Lab Results:  Recent Labs  08/15/13 0530  WBC 8.3  HGB 13.3  PLT 171    Recent Labs  08/16/13 0505 08/17/13 0405  NA 137 139  K 3.6 4.1  CL 101 105  CO2 27 26  GLUCOSE 115* 103*  BUN 18 19  CREATININE 0.88 0.77   No results found for this basename: TROPONINI, CK, MB,  in the last 72 hours No results found for this basename: INR,  in the last 72 hours  Imaging: Imaging results have been reviewed  Cardiac Studies:  Assessment/Plan:   Principal Problem:   Pulmonary edema, acute Active Problems:   Stage IV cancer of cecum T4b N2b M1, peritoneal mets   Mitral stenosis and aortic insufficiency   Aortic valve stenosis, moderate   Chronic atrial fibrillation   S/P CABG x 5 in '02, PCI '05   Peripheral vascular disease, asymptomatic   Acute on chronic systolic heart failure    PLAN:  Stop K+ supplement as he is on Aldactone- check BMP in am. Anticipate discharge tomorrow.  TCM  follow up, I can see.   Corine Shelter PA-C Beeper [phone] 08/17/2013, 10:14 AM   I have seen and examined the patient along with Corine Shelter, PAC.  I have reviewed the  chart, notes and new data.  I agree with PA's note.  Key new complaints: breathing back to baseline, even with ambulation Key examination changes: clear lungs, precordial heave, virtually no edema Key new findings / data: creat 0.77 - better with diuresis  PLAN: Monitor for 24 h more on oral diuretic. Probable DC in AM. Daily weights are critical in management, but need to keep in mind he is losing "real weight" (over 40 lbs this year since diagnosis of cancer. May need to adjust dry weight estimation periodically.  Thurmon Fair, MD, [facility] [phone] 08/17/2013, 1:58 PM

## 2013-08-17 NOTE — Progress Notes (Signed)
CMT reported that the patient had 21 beat run of Vtach, strip reviewed and PA notified; orders given, will continue to monitor patient. D.Tamsyn Owusu RN

## 2013-08-17 NOTE — Progress Notes (Signed)
Bedside report received, assessment completed; patient and vital signs are stable, will continue to monitor patient. D.brownRN

## 2013-08-17 NOTE — Progress Notes (Signed)
Patient has declined services.  Will contact patient's wife and PCP to assist with service engagement.  Of note, [facility] Care Management services does not replace or interfere with any services that are arranged by inpatient case management or social work.  For additional questions or referrals please contact Anibal Henderson BSN RN [facility] Liaison at [phone].

## 2013-08-17 NOTE — Evaluation (Signed)
Physical Therapy Evaluation Patient Details Name: Charles Faulkner MRN: [id number] DOB: 1926-07-24 Today's Date: 08/17/2013 Time: 4098-1191 PT Time Calculation (min): 24 min  PT Assessment / Plan / Recommendation History of Present Illness  79 yo male admitted for severe SOB, acute pulm edema. Medical hx includes chronic Afib, AAA, colon cancer  Clinical Impression  On eval, pt required supervision level assist for mobility-able to ambulate ~175 feet with RW. Pt denied SOB. Anticipate pt will continue to progress well. Plan is for d/c home on tomorrow with wife. Do not feel pt has any follow up PT needs. 1x eval    PT Assessment  Patent does not need any further PT services    Follow Up Recommendations  Supervision/Assistance - 24 hour;No PT follow up    Does the patient have the potential to tolerate intense rehabilitation      Barriers to Discharge        Equipment Recommendations  None recommended by PT    Recommendations for Other Services     Frequency      Precautions / Restrictions Restrictions Weight Bearing Restrictions: No   Pertinent Vitals/Pain No c/o pain. No SOB noted during session      Mobility  Bed Mobility Bed Mobility: Supine to Sit Supine to Sit: 6: Modified independent (Device/Increase time);HOB elevated Transfers Transfers: Sit to Stand;Stand to Sit Sit to Stand: 5: Supervision;From bed Stand to Sit: 5: Supervision;To bed Details for Transfer Assistance: VCS hand placement Ambulation/Gait Ambulation/Gait Assistance: 5: Supervision Ambulation Distance (Feet): 175 Feet Assistive device: Rolling walker Ambulation/Gait Assistance Details: VCS pacing. Pt has leg length discrepancy L longer than R.  Gait Pattern: Decreased stride length;Step-through pattern;Trunk flexed    Exercises     PT Diagnosis:    PT Problem List:   PT Treatment Interventions:       PT Goals(Current goals can be found in the care plan section) Acute Rehab PT  Goals Patient Stated Goal: home tomorrow PT Goal Formulation: No goals set, d/c therapy  Visit Information  Last PT Received On: 08/17/13 Assistance Needed: +1 History of Present Illness: 79 yo male admitted for severe SOB, acute pulm edema. Medical hx includes chronic Afib, AAA, colon cancer       Prior Functioning  Home Living Family/patient expects to be discharged to:: Private residence Living Arrangements: Spouse/significant other Available Help at Discharge: Family Type of Home: House Home Access: Ramped entrance Home Layout: Two level;Able to live on main level with bedroom/bathroom Home Equipment: Wheelchair - [employer] - 2 wheels;Walker - 4 wheels;Bedside commode;Hospital bed;Cane - single point Prior Function Level of Independence: Independent with assistive device(s) Comments: wife shaves pt Communication Communication: HOH    Cognition  Cognition Arousal/Alertness: Awake/alert Behavior During Therapy: WFL for tasks assessed/performed Overall Cognitive Status: Within Functional Limits for tasks assessed    Extremity/Trunk Assessment Upper Extremity Assessment Upper Extremity Assessment: Generalized weakness Lower Extremity Assessment Lower Extremity Assessment: Generalized weakness Cervical / Trunk Assessment Cervical / Trunk Assessment: Kyphotic   Balance    End of Session PT - End of Session Equipment Utilized During Treatment: Gait belt Activity Tolerance: Patient tolerated treatment well Patient left: in bed;with call bell/phone within reach;with family/visitor present  GP     Rebeca Alert, MPT Pager: [phone]

## 2013-08-18 DIAGNOSIS — J9600 Acute respiratory failure, unspecified whether with hypoxia or hypercapnia: Secondary | ICD-10-CM

## 2013-08-18 LAB — BASIC METABOLIC PANEL
BUN: 21 mg/dL (ref 6–23)
CO2: 26 mEq/L (ref 19–32)
Calcium: 9.5 mg/dL (ref 8.4–10.5)
Creatinine, Ser: 0.78 mg/dL (ref 0.50–1.35)
GFR calc Af Amer: >90 mL/min (ref >90)
Glucose, Bld: 100 mg/dL — ABNORMAL HIGH (ref 70–99)
Potassium: 3.8 mEq/L (ref 3.5–5.1)
Sodium: 146 mEq/L — ABNORMAL HIGH (ref 135–145)

## 2013-08-18 LAB — PRO B NATRIURETIC PEPTIDE: Pro B Natriuretic peptide (BNP): 5741 pg/mL — ABNORMAL HIGH (ref 0–450)

## 2013-08-18 MED ORDER — FUROSEMIDE 40 MG PO TABS: 40.0000 mg | ORAL_TABLET | Freq: Every day | ORAL | Status: DC

## 2013-08-18 MED ORDER — ATENOLOL 25 MG PO TABS: 12.5000 mg | ORAL_TABLET | Freq: Two times a day (BID) | ORAL | Status: DC

## 2013-08-18 MED ORDER — SPIRONOLACTONE 25 MG PO TABS: 12.5000 mg | ORAL_TABLET | Freq: Every day | ORAL | Status: AC

## 2013-08-18 MED ORDER — DIGOXIN 62.5 MCG PO TABS: 0.0625 mg | ORAL_TABLET | Freq: Every day | ORAL | Status: DC

## 2013-08-18 MED ORDER — LOSARTAN POTASSIUM 25 MG PO TABS: 25.0000 mg | ORAL_TABLET | Freq: Every day | ORAL | Status: DC

## 2013-08-18 NOTE — Discharge Summary (Signed)
Physician Discharge Summary  Patient ID: Charles Faulkner MRN: [id number] DOB/AGE: 77-Mar-1927 77 y.o.  Admit date: 08/13/2013 Discharge date: 08/18/2013  Admission Diagnoses: Acute respiratory failure, Acute on chronic systolic heart failure.  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 [name]. He has a history of CAD, PVOD, Chronic afib(not on anticoagulation), colon cancer, status post hemicolectomy with small bowel resection due to adenocarcinoma with peritoneal metastases, HLD, AAA, HTN. He had coronary artery bypass grafting in 2002 and subsequent stenting of his RCA ostium by Dr. [name] September 20, 2004. He had right carotid endarterectomy performed by Dr. [name] in 2006 with moderate left ICA stenosis. We have followed his duplex ultrasounds. He also has moderate aortic stenosis with a valve area of 0.92 sq cm by echo May 2012. He has an EF in the 40% to 45% range. His last NST was 40981 and was considered low risk although there was new mild anterolateral ischemia. He has been on chemotherapy, followed by Dr. [name]. He apparently has had some progression of disease by CT scanning and is scheduled to undergo initiation of radiation therapy.   The patient was at the [facility] office for a 2D echo when he developed severe SOB and stated that he could not breath. According to his wife he has been having trouble for the last 1-1/2 to 2 weeks. He sleeps in the recliner and has LEE. He  currently denies nausea, vomiting, fever, chest pain, congestion, abdominal pain, hematochezia, melena, Diarrhea, constipation. He has noticed decreased exercise tolerance. He was sent by EMS to the ER; placed on Bi-Pap and given IV lasix.   He was also followed by PCCM.  He was admitted to CCU and continued on IV lasix therapy.  Net fluids:  -3.7L.  Initial BNP was 19147.  Last was 5741.0.  EF 25 - 30% with probable moderate AS. AF rate controlled.  Digoxin was decreased to 0.0625mg .  Atenolol decreased to 12.5 mg BID due to bradycarida.  Lasix was eventually changed to PO.  Low dose spironolactone was added along with ARB.  He did have a 21 beat run of NSVT.  He is not considered an AICD candidate with co-morbidities. The patient was seen by Dr. [name] who felt he was stable for DC home.  TCM 7 follow up appt arranged.   Consults: PCCM  Significant Diagnostic Studies:  2D echo Study Conclusions  - Left ventricle: The cavity size was mildly dilated. There was mild concentric hypertrophy. Systolic function was severely reduced. The estimated ejection fraction was in the range of 25% to 30%. LV mid-ventricular false tendon. Diffuse hypokinesis. There is Stage 3 (restrictive) physiology. The E/e' ratio is 20, suggesting markedly elevated LV filling pressure. The E/A ratio is >3. The MV decel time is 127 msec. - Aortic valve: Moderately calcified leaflets. There is mild to moderate regurgitation. - Mitral valve: Calcified annulus. There is tethering of the posterior leaflet. Moderate to severe regurgitation is present. - Left atrium: Moderate to  severely dilated. - Tricuspid valve: There appears to be a partially flail subvalvular cord. No prolapse of the tricuspid leaflets is noted. TR Color doppler was not obtained due to patient factors. - Pericardium, extracardiac: There appeared to be a large left pleural effusion. - Impressions: The exam was discontinued due to marked dyspnea and  hypoxia. Although aortic valve dopplers were not obtained, there appears to be at least moderate aortic stenosis visually. Impressions:  - The exam was discontinued due to marked dyspnea and hypoxia. Although aortic valve dopplers were not obtained, there appears to be at least moderate aortic stenosis visually.  .cbc  Treatments: See above  Discharge Exam: Blood pressure 103/40, pulse 69, temperature 98 F (36.7 C), temperature source Oral, resp. rate 8, height 5\' 7"  (1.702 m), weight 119 lb 11.2 oz (54.296 kg), SpO2 95.00%.   Disposition: 01-Home or Self Care      Discharge Orders   Future Appointments Provider Department Dept Phone   08/20/2013 11:40 AM Abelino Derrick, [place] [facility] [place] [phone]   08/24/2013 10:00 AM Wilburt Finlay, PA-C [facility] [place] [phone]   08/27/2013 10:00 AM Dava Najjar [facility] [phone]   08/27/2013 10:30 AM [facility] [phone]   Patient to arrive 15 minutes prior to appointment time. Patient to pick up oral contrast at least 1 day prior to exam, unless otherwise instructed by your physician. No solid food 4 hours prior to exam. Liquids and Medicines are okay.   08/31/2013 9:15 AM Rana Snare, NP [facility] MEDICAL ONCOLOGY [phone]   09/07/2013 10:00 AM [name], MD [facility] [place] [phone]   Future Orders Complete By Expires   Diet - low sodium heart healthy  As directed    Discharge instructions  As directed    Comments:     Weigh yourself daily.  If you gain 2# in 24 hours or 5# in a week, call our office to let us know and get instructions.   Increase activity slowly  As directed        Medication List    STOP taking these medications       hydrochlorothiazide 25 MG tablet  Commonly known as:  HYDRODIURIL     potassium chloride SA 20 MEQ  tablet  Commonly known as:  K-DUR,KLOR-CON      TAKE these medications       atenolol 25 MG tablet  Commonly known as:  TENORMIN  Take 0.5 tablets (12.5 mg total) by mouth 2 (two) times daily.     atorvastatin 40 MG tablet  Commonly known as:  LIPITOR  Take 40 mg by mouth daily.     clopidogrel 75 MG tablet  Commonly known as:  PLAVIX  Take 1 tablet (75 mg total) by mouth daily.     [facility] SPECTRAVITE SENIOR PO  Take 1 tablet by mouth daily.     Digoxin 62.5 MCG Tabs  Take 0.0625 mg by mouth daily.     furosemide 40 MG tablet  Commonly known as:  LASIX  Take 1 tablet (40 mg total) by mouth daily.     lansoprazole 15 MG capsule  Commonly known as:  PREVACID  Take 15 mg by mouth daily.     losartan 25 MG tablet  Commonly known as:  COZAAR  Take 1 tablet (25 mg total) by mouth daily.     niacin 500  MG CR tablet  Commonly known as:  NIASPAN  Take 3 tablets (1,500 mg total) by mouth at bedtime.     nitroGLYCERIN 0.4 MG SL tablet  Commonly known as:  NITROSTAT  Place 1 tablet (0.4 mg total) under the tongue every 5 (five) minutes as needed for chest pain.     polyethylene glycol powder powder  Commonly known as:  GLYCOLAX/MIRALAX  Take 17 g by mouth daily as needed (for constipation).     spironolactone 25 MG tablet  Commonly known as:  ALDACTONE  Take 0.5 tablets (12.5 mg total) by mouth daily.     ZETIA 10 MG tablet  Generic drug:  ezetimibe  Take 1 tablet by mouth daily.       Follow-up Information   Follow up with Petrina Melby, PA-C. (Our office scheduler will call you with the appt date and time.)    Specialty:  Physician Assistant   Contact information:   [address] [place] [address] [phone]      Greater than 30 minutes was spent completing the patient's discharge.    SignedWilburt Finlay 08/18/2013, 1:04 PM

## 2013-08-18 NOTE — Progress Notes (Signed)
Home discharge instructions and medications given to patient and wife (Lona). Pt and wife to pick up [facility] [place] which were faxed by PA. Heart failure information and information on low sodium diet given and explained. Pts wife (caregiver) verbally understands instructions and has no questions at this time.

## 2013-08-18 NOTE — Progress Notes (Signed)
Central Telemetry notified that monitor taken off due to patients discharge. Monitor discontinued and cleaned.

## 2013-08-18 NOTE — Progress Notes (Signed)
Patient discharged via wheelchair and volunteer services. Patient going home with wife who is patients caregiver. Belongings taken out with wife.

## 2013-08-18 NOTE — Progress Notes (Signed)
Subjective: Feel better.  Objective: Vital signs in last 24 hours: Temp:  [97.4 F (36.3 C)-98.4 F (36.9 C)] 98 F (36.7 C) (09/23 0445) Pulse Rate:  [54-97] 65 (09/23 0445) Resp:  [8-18] 8 (09/23 0445) BP: (104-137)/(45-57) 109/55 mmHg (09/23 0445) SpO2:  [96 %-100 %] 96 % (09/23 0445) Weight:  [119 lb 11.2 oz (54.296 kg)] 119 lb 11.2 oz (54.296 kg) (09/23 0445) Last BM Date: 08/17/13  Intake/Output from previous day: 09/22 0701 - 09/23 0700 In: 849 [P.O.:840; I.V.:9] Out: 950 [Urine:950] Intake/Output this shift: Total I/O In: -  Out: 150 [Urine:150]  Medications Current Facility-Administered Medications  Medication Dose Route Frequency Provider Last Rate Last Dose  . 0.9 %  sodium chloride infusion  250 mL Intravenous PRN Wilburt Finlay, PA-C   250 mL at 08/13/13 1300  . acetaminophen (TYLENOL) tablet 650 mg  650 mg Oral Q4H PRN Wilburt Finlay, PA-C      . albuterol (PROVENTIL) (5 MG/ML) 0.5% nebulizer solution 2.5 mg  2.5 mg Nebulization Q3H PRN Bernadene Person, NP      . atenolol (TENORMIN) tablet 12.5 mg  12.5 mg Oral BID Lennette Bihari, MD   12.5 mg at 08/17/13 2213  . atorvastatin (LIPITOR) tablet 40 mg  40 mg Oral q1800 Wilburt Finlay, PA-C   40 mg at 08/17/13 1742  . clopidogrel (PLAVIX) tablet 75 mg  75 mg Oral Q breakfast Wilburt Finlay, PA-C   75 mg at 08/18/13 0749  . digoxin (LANOXIN) tablet 0.0625 mg  0.0625 mg Oral Daily Lennette Bihari, MD   0.0625 mg at 08/17/13 1042  . enoxaparin (LOVENOX) injection 40 mg  40 mg Subcutaneous Q24H Wilburt Finlay, PA-C   40 mg at 08/17/13 1349  . ezetimibe (ZETIA) tablet 10 mg  10 mg Oral Daily Wilburt Finlay, PA-C   10 mg at 08/17/13 1042  . famotidine (PEPCID) tablet 20 mg  20 mg Oral QHS Benny Lennert, RPH   20 mg at 08/17/13 2213  . furosemide (LASIX) tablet 40 mg  40 mg Oral Daily Lennette Bihari, MD   40 mg at 08/17/13 1042  . losartan (COZAAR) tablet 25 mg  25 mg Oral Daily Marykay Lex, MD   25 mg at 08/17/13 1042  .  nitroGLYCERIN 0.2 mg/mL in dextrose 5 % infusion  5 mcg/min Intravenous Titrated Shon Baton, MD   2.5 mcg/min at 08/13/13 1107  . ondansetron (ZOFRAN) injection 4 mg  4 mg Intravenous Q6H PRN Wilburt Finlay, PA-C      . pantoprazole (PROTONIX) EC tablet 40 mg  40 mg Oral Q breakfast Wilburt Finlay, PA-C   40 mg at 08/18/13 0749  . polyethylene glycol (MIRALAX / GLYCOLAX) packet 17 g  17 g Oral Daily Lennette Bihari, MD   17 g at 08/17/13 1042  . sodium chloride 0.9 % injection 3 mL  3 mL Intravenous Q12H Wilburt Finlay, PA-C   3 mL at 08/17/13 2216  . sodium chloride 0.9 % injection 3 mL  3 mL Intravenous PRN Wilburt Finlay, PA-C   3 mL at 08/17/13 2214  . spironolactone (ALDACTONE) tablet 12.5 mg  12.5 mg Oral Daily Marykay Lex, MD   12.5 mg at 08/17/13 1042    PE: General appearance: alert, cooperative and no distress Lungs: clear to auscultation bilaterally Heart: irregularly irregular rhythm Extremities: No LEE Pulses: 2+ radials Skin: Warm and dry  Lab Results:  No results found for this basename: WBC,  HGB, HCT, PLT,  in the last 72 hours BMET  Recent Labs  08/16/13 0505 08/17/13 0405 08/17/13 2103 08/18/13 0432  NA 137 139  --  146*  K 3.6 4.1 4.0 3.8  CL 101 105  --  111  CO2 27 26  --  26  GLUCOSE 115* 103*  --  100*  BUN 18 19  --  21  CREATININE 0.88 0.77  --  0.78  CALCIUM 9.7 9.8  --  9.5    Assessment/Plan  Principal Problem:   Acute respiratory failure Active Problems:   Acute on chronic systolic heart failure   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  BNP decreased although still elevated.  Admit wt 137, today 119#.  Continue 40mg  Lasix daily.  21 beat NSVT on tele.  Not AICD candidate and bradycardic in  to the 40's at night.  Already on atenolol.  DC home today.     LOS: 5 days    HAGER, BRYAN 08/18/2013 9:17 AM   Agree with note written by Jones Skene PAC  Diuresed. Lungs clear. SB/ NSVT with severe LV dysfunction. Not an ICD candidate. OK for D/C home. TCM 7.   [name] 08/18/2013 9:50 AM

## 2013-08-18 NOTE — Progress Notes (Signed)
08/17/13 Was told that patient had a 21 beat run of v-tach by day shift RN and that PA was contacted and notified. New orders were written for Magnesium and Potassium lab draws. Lab results were within normal range. MD on call for [facility], Dr.Whitlock was called and notified of lab results at 2300. No new orders written. Pt.is A/Ox4 and is hard of hearing in both ears at baseline.He is 1 person assist with a walker and is on room air. No c/o pain or signs of distress. HR decreases to 38-40's bpm nonsustained when sleeping then increases to 50's -70's bpm. He is asymptomatic and has a history of decreased HR at rest.

## 2013-08-19 ENCOUNTER — Encounter (HOSPITAL_COMMUNITY): Payer: Medicare Other

## 2013-08-19 ENCOUNTER — Ambulatory Visit (HOSPITAL_COMMUNITY)
Admission: RE | Admit: 2013-08-19 | Discharge: 2013-08-19 | Disposition: A | Discharge status: Home / Self Care | Payer: Medicare Other | Source: Ambulatory Visit | Attending: Cardiovascular Disease | Admitting: Cardiovascular Disease

## 2013-08-19 ENCOUNTER — Other Ambulatory Visit: Payer: Self-pay | Admitting: Cardiovascular Disease

## 2013-08-19 DIAGNOSIS — I6529 Occlusion and stenosis of unspecified carotid artery: Secondary | ICD-10-CM | POA: Insufficient documentation

## 2013-08-19 DIAGNOSIS — R600 Localized edema: Secondary | ICD-10-CM

## 2013-08-19 DIAGNOSIS — R609 Edema, unspecified: Secondary | ICD-10-CM | POA: Insufficient documentation

## 2013-08-19 DIAGNOSIS — M7989 Other specified soft tissue disorders: Secondary | ICD-10-CM

## 2013-08-19 NOTE — Progress Notes (Signed)
Carotid Duplex Completed. °Brianna L Mazza,RVT °

## 2013-08-19 NOTE — Telephone Encounter (Signed)
Rx was sent to pharmacy electronically. 

## 2013-08-19 NOTE — Progress Notes (Signed)
Bilateral Lower Extremity Venous Duplex Completed. °Brianna L Mazza,RVT °

## 2013-08-20 ENCOUNTER — Ambulatory Visit: Payer: Medicare Other | Admitting: Cardiology

## 2013-08-20 ENCOUNTER — Telehealth: Payer: Self-pay | Admitting: Cardiology

## 2013-08-20 NOTE — Telephone Encounter (Signed)
Doing well. He will keep appointment. His wgt is 119, same as discharge wgt.  Corine Shelter PA-C 08/20/2013 2:10 PM

## 2013-08-24 ENCOUNTER — Ambulatory Visit: Payer: Medicare Other | Admitting: Physician Assistant

## 2013-08-24 ENCOUNTER — Encounter: Payer: Self-pay | Admitting: Cardiology

## 2013-08-24 ENCOUNTER — Encounter: Payer: Self-pay | Admitting: Cardiovascular Disease

## 2013-08-24 ENCOUNTER — Ambulatory Visit (INDEPENDENT_AMBULATORY_CARE_PROVIDER_SITE_OTHER): Payer: Medicare Other | Admitting: Cardiology

## 2013-08-24 VITALS — BP 108/70 | HR 68 | Ht 67.0 in | Wt 128.0 lb

## 2013-08-24 DIAGNOSIS — I255 Ischemic cardiomyopathy: Secondary | ICD-10-CM

## 2013-08-24 DIAGNOSIS — I2589 Other forms of chronic ischemic heart disease: Secondary | ICD-10-CM

## 2013-08-24 DIAGNOSIS — I10 Essential (primary) hypertension: Secondary | ICD-10-CM

## 2013-08-24 DIAGNOSIS — I4891 Unspecified atrial fibrillation: Secondary | ICD-10-CM

## 2013-08-24 DIAGNOSIS — I482 Chronic atrial fibrillation, unspecified: Secondary | ICD-10-CM

## 2013-08-24 DIAGNOSIS — Z951 Presence of aortocoronary bypass graft: Secondary | ICD-10-CM

## 2013-08-24 DIAGNOSIS — C182 Malignant neoplasm of ascending colon: Secondary | ICD-10-CM

## 2013-08-24 DIAGNOSIS — N183 Chronic kidney disease, stage 3 unspecified: Secondary | ICD-10-CM

## 2013-08-24 DIAGNOSIS — I5023 Acute on chronic systolic (congestive) heart failure: Secondary | ICD-10-CM

## 2013-08-24 NOTE — Patient Instructions (Signed)
Have lab drawn before next office visit

## 2013-08-24 NOTE — Progress Notes (Signed)
08/24/2013 Charles Faulkner   14-Sep-1926  [id number]  Primary Physicia [name]., MD Primary Cardiologist: Dr [name]  HPI:  79 y/o ([birth year] is his 87th birthday), well known to Dr [name] with a history of CAD, s/p CABG X 5 in '02 with a PCI in 2005. He just had a Myoview 08/09/13 that was low risk with some scarring but no new changes. He has been Rx'd for colon cancer with surgery and chemotherapy. He has reported mets to his peritoneum and radiation therapy was to start. He saw Dr [name] 08/05/13 and had been having some dyspnea for a few weeks. On 08/13/13 he was scheduled for an echo in the office. His wife says he was doing OK earlier that am but when he got to the lobby he started complaining of SOB. This progressively got worse and by the time I was called to see the pt in the echo room he was in respiratory distress. His O2 sat initially was 77%. This improved to 90% but the pt said he could not tolerate having the facemask on. EMS was called. We did manage to get an IV in and gave him 40 mg of IV Lasix. EMS gave him one NTG SL and transported to [facility] ER. By the time he arrived in the ER he was starting to improve. He was on bi-pap for a time. He was admitted and diuresed and within 24 hrs was significantly improved. Echo showed severe LVD with an EF of 25% and AOv Ca++. His discharge wgt was 119. He is in the office for TCM follow up. He has been doing well. His wgt in the office is 128 but his wife has all his wgt since discharge, including this am, and they have been between 119-122.    Current Outpatient Prescriptions  Medication Sig Dispense Refill  . atenolol (TENORMIN) 25 MG tablet Take 0.5 tablets (12.5 mg total) by mouth 2 (two) times daily.  60 tablet  5  . atorvastatin (LIPITOR) 40 MG tablet Take 40 mg by mouth daily.      . clopidogrel (PLAVIX) 75 MG tablet Take 1 tablet (75 mg total) by mouth daily.  30 tablet  6  . digoxin 62.5 MCG TABS Take 0.0625 mg by mouth daily.  30 tablet  5   . furosemide (LASIX) 40 MG tablet Take 1 tablet (40 mg total) by mouth daily.  30 tablet  5  . lansoprazole (PREVACID) 15 MG capsule Take 15 mg by mouth daily.        Marland Kitchen losartan (COZAAR) 25 MG tablet Take 1 tablet (25 mg total) by mouth daily.  30 tablet  5  . Multiple Vitamins-Minerals ([facility] SPECTRAVITE SENIOR PO) Take 1 tablet by mouth daily.        . niacin (NIASPAN) 500 MG CR tablet Take 3 tablets (1,500 mg total) by mouth at bedtime.  90 tablet  9  . nitroGLYCERIN (NITROSTAT) 0.4 MG SL tablet Place 1 tablet (0.4 mg total) under the tongue every 5 (five) minutes as needed for chest pain.  25 tablet  6  . polyethylene glycol powder (GLYCOLAX/MIRALAX) powder Take 17 g by mouth daily as needed (for constipation).       Marland Kitchen spironolactone (ALDACTONE) 25 MG tablet Take 0.5 tablets (12.5 mg total) by mouth daily.  30 tablet  5  . ZETIA 10 MG tablet TAKE 1 TABLET DAILY.  30 tablet  11   No current facility-administered medications for this visit.    Allergies  Allergen Reactions  . Aspirin   . Metoprolol Tartrate     REACTION: hives    History   Social History  . Marital Status: Married    Spouse Name: N/A    Number of Children: 0  . Years of Education: N/A   Occupational History  . Not on file.   Social History Main Topics  . Smoking status: Former Games developer  . Smokeless tobacco: Never Used     Comment: quit remotely  . Alcohol Use: No  . Drug Use: No  . Sexual Activity: No   Other Topics Concern  . Not on file   Social History Narrative  . No narrative on file     Review of Systems: General: negative for chills, fever, night sweats or weight changes.  Cardiovascular: negative for chest pain, dyspnea on exertion, edema, orthopnea, palpitations, paroxysmal nocturnal dyspnea or shortness of breath Dermatological: negative for rash Respiratory: negative for cough or wheezing Urologic: negative for hematuria Abdominal: negative for nausea, vomiting, diarrhea, bright red  blood per rectum, melena, or hematemesis Neurologic: negative for visual changes, syncope, or dizziness All other systems reviewed and are otherwise negative except as noted above.    Blood pressure 108/70, pulse 68, height 5\' 7"  (1.702 m), weight 128 lb (58.06 kg).  General appearance: alert, cooperative, cachectic and no distress Lungs: decreased at bases Heart: irregularly irregular rhythm and 2/6 systolic murmur Extremities: trace edema on Rt , negative on Lt    ASSESSMENT AND PLAN:   Acute on chronic systolic heart failure He is a TCM follow up, just discharged 08/18/13.  Cardiomyopathy, ischemic- 20-25% 9/14 .  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  Chronic atrial fibrillation .  S/P CABG x 5 in '02, PCI '05 .   PLAN  Same cardiac Rx. His SCr was drifting up at discharge and we will check a BMP in one month and get him an office visit in one month. He is to have a CT tomorrow to follow up his cancer Rx.   Madeliene Tejera KPA-C 08/24/2013 2:00 PM

## 2013-08-24 NOTE — Assessment & Plan Note (Signed)
Finished chemo, he is for CT tomorrow.

## 2013-08-24 NOTE — Assessment & Plan Note (Signed)
He is a TCM follow up, just discharged 08/18/13.

## 2013-08-27 ENCOUNTER — Other Ambulatory Visit (HOSPITAL_BASED_OUTPATIENT_CLINIC_OR_DEPARTMENT_OTHER): Payer: Medicare Other | Admitting: Lab

## 2013-08-27 ENCOUNTER — Ambulatory Visit (HOSPITAL_COMMUNITY)
Admission: RE | Admit: 2013-08-27 | Discharge: 2013-08-27 | Disposition: A | Discharge status: Home / Self Care | Payer: Medicare Other | Source: Ambulatory Visit | Attending: Oncology | Admitting: Oncology

## 2013-08-27 DIAGNOSIS — C786 Secondary malignant neoplasm of retroperitoneum and peritoneum: Secondary | ICD-10-CM

## 2013-08-27 DIAGNOSIS — C189 Malignant neoplasm of colon, unspecified: Secondary | ICD-10-CM | POA: Insufficient documentation

## 2013-08-27 DIAGNOSIS — N200 Calculus of kidney: Secondary | ICD-10-CM | POA: Insufficient documentation

## 2013-08-27 DIAGNOSIS — J90 Pleural effusion, not elsewhere classified: Secondary | ICD-10-CM | POA: Insufficient documentation

## 2013-08-27 DIAGNOSIS — I700 Atherosclerosis of aorta: Secondary | ICD-10-CM | POA: Insufficient documentation

## 2013-08-27 DIAGNOSIS — C182 Malignant neoplasm of ascending colon: Secondary | ICD-10-CM

## 2013-08-27 LAB — CBC WITH DIFFERENTIAL/PLATELET
BASO%: 0.3 % (ref 0.0–2.0)
Basophils Absolute: 0 10*3/uL (ref 0.0–0.1)
EOS%: 1.5 % (ref 0.0–7.0)
HCT: 40.3 % (ref 38.4–49.9)
HGB: 13.6 g/dL (ref 13.0–17.1)
LYMPH%: 15 % (ref 14.0–49.0)
MCH: 33.9 pg — ABNORMAL HIGH (ref 27.2–33.4)
MCHC: 33.8 g/dL (ref 32.0–36.0)
MCV: 100.2 fL — ABNORMAL HIGH (ref 79.3–98.0)
NEUT%: 74.5 % (ref 39.0–75.0)
Platelets: 176 10*3/uL (ref 140–400)
RDW: 14.3 % (ref 11.0–14.6)

## 2013-08-27 LAB — COMPREHENSIVE METABOLIC PANEL (CC13)
ALT: 19 U/L (ref 0–55)
AST: 24 U/L (ref 5–34)
Albumin: 3.8 g/dL (ref 3.5–5.0)
BUN: 18.5 mg/dL (ref 7.0–26.0)
Calcium: 10.7 mg/dL — ABNORMAL HIGH (ref 8.4–10.4)
Chloride: 104 mEq/L (ref 98–109)
Creatinine: 0.9 mg/dL (ref 0.7–1.3)
Glucose: 119 mg/dl (ref 70–140)
Total Bilirubin: 0.67 mg/dL (ref 0.20–1.20)
Total Protein: 7.5 g/dL (ref 6.4–8.3)

## 2013-08-28 ENCOUNTER — Telehealth: Payer: Self-pay | Admitting: *Deleted

## 2013-08-28 ENCOUNTER — Encounter: Payer: Self-pay | Admitting: *Deleted

## 2013-08-28 DIAGNOSIS — I6529 Occlusion and stenosis of unspecified carotid artery: Secondary | ICD-10-CM

## 2013-08-28 LAB — CEA: CEA: 3.3 ng/mL (ref 0.0–5.0)

## 2013-08-28 NOTE — Telephone Encounter (Signed)
Order placed for repeat carotid doppler in 1 year 

## 2013-08-28 NOTE — Telephone Encounter (Signed)
Message copied by Marella Bile on Fri Aug 28, 2013  2:28 PM ------      Message from: [name]      Created: Wed Aug 26, 2013  8:55 PM       No change from prior study. Repeat in 12 months. ------

## 2013-08-28 NOTE — Telephone Encounter (Signed)
Pt's wife notified of good CT result.  She was pleased & thankful & will see Lonna Cobb NP on Monday.

## 2013-08-28 NOTE — Telephone Encounter (Signed)
Message copied by Sabino Snipes on Fri Aug 28, 2013  5:32 PM ------      Message from: Charles Faulkner      Created: Thu Aug 27, 2013  1:48 PM       Call wife (pt deaf) CT good - no sign of progression of colon cancer ------

## 2013-08-31 ENCOUNTER — Ambulatory Visit (HOSPITAL_BASED_OUTPATIENT_CLINIC_OR_DEPARTMENT_OTHER): Payer: Medicare Other | Admitting: Nurse Practitioner

## 2013-08-31 ENCOUNTER — Telehealth: Payer: Self-pay | Admitting: Oncology

## 2013-08-31 ENCOUNTER — Other Ambulatory Visit: Payer: Medicare Other | Admitting: Lab

## 2013-08-31 VITALS — BP 125/52 | HR 90 | Temp 97.0°F | Resp 18 | Ht 67.0 in | Wt 130.0 lb

## 2013-08-31 DIAGNOSIS — C182 Malignant neoplasm of ascending colon: Secondary | ICD-10-CM

## 2013-08-31 DIAGNOSIS — C786 Secondary malignant neoplasm of retroperitoneum and peritoneum: Secondary | ICD-10-CM

## 2013-08-31 DIAGNOSIS — I10 Essential (primary) hypertension: Secondary | ICD-10-CM

## 2013-08-31 DIAGNOSIS — C180 Malignant neoplasm of cecum: Secondary | ICD-10-CM

## 2013-08-31 DIAGNOSIS — E785 Hyperlipidemia, unspecified: Secondary | ICD-10-CM

## 2013-08-31 NOTE — Telephone Encounter (Signed)
gv and printed appt sched and avs for pt for Dec adn Jan 2015  °

## 2013-08-31 NOTE — Progress Notes (Signed)
OFFICE PROGRESS NOTE  Interval history:  Charles Faulkner is an 79 year old man diagnosed with stage IV colon cancer with diffuse peritoneal metastases at diagnosis April 2012. He underwent resection of the primary lesion in the cecum. He was maintained on single agent oral Xeloda chemotherapy through March 2014 when a CT scan showed increased prominence of an area of concern in the right lower quadrant. He completed a course of radiation therapy to the right pelvis on 03/27/2013. He has since been followed on observation approach with routine CT scans.  Most recent CT abdomen/pelvis without contrast on 08/27/2013 showed a periaortic lymph node measuring 1 cm compared to a previous measurement 8 mm. More distally there was a 1.2 cm periaortic lymph node that previously measured 1.1 cm. The soft tissue mass within the right lower quadrant measured 1.3 x 1.8 cm compared to 2.7 x 3.0 cm previously.  Per review of hospital records he was admitted 08/13/2013 through 08/18/2013 with acute respiratory failure, acute on chronic systolic heart failure.  He is seen today for scheduled followup.  He reports he is feeling well. His wife is following his weight and diet closely. He reports a good appetite. He denies any pain. Bowels moving regularly. No hematochezia or melena. He denies nausea/vomiting. No mouth sores. He denies shortness of breath. No chest pain. No unusual headaches. No vision change. He denies any skin changes.   Objective: Blood pressure 125/52, pulse 90, temperature 97 F (36.1 C), temperature source Oral, resp. rate 18, height 5\' 7"  (1.702 m), weight 130 lb (58.968 kg).  Oropharynx is without thrush or ulceration. No palpable cervical, supraclavicular or axillary lymph nodes. Lungs are clear. Irregular cardiac rhythm. 2/6 systolic murmur. Abdomen is soft. Nontender. No hepatomegaly. Soft midline hernia. Trace lower leg edema bilaterally right greater than left. Motor strength 5 over 5. 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 He is hard of hearing. Ecchymoses scattered over the forearms. Palms are without erythema.  Lab Results: Lab Results  Component Value Date   WBC 8.6 08/27/2013   HGB 13.6 08/27/2013   HCT 40.3 08/27/2013   MCV 100.2* 08/27/2013   PLT 176 08/27/2013    Chemistry:    Chemistry      Component Value Date/Time   NA 142 08/27/2013 0836   NA 146* 08/18/2013 0432   NA 143 06/05/2012 0822   K 3.8 08/27/2013 0836   K 3.8 08/18/2013 0432   K 3.9 06/05/2012 0822   CL 111 08/18/2013 0432   CL 107 04/01/2013 1048   CL 100 06/05/2012 0822   CO2 25 08/27/2013 0836   CO2 26 08/18/2013 0432   CO2 29 06/05/2012 0822   BUN 18.5 08/27/2013 0836   BUN 21 08/18/2013 0432   BUN 15 06/05/2012 0822   CREATININE 0.9 08/27/2013 0836   CREATININE 0.78 08/18/2013 0432   CREATININE 0.8 06/05/2012 0822      Component Value Date/Time   CALCIUM 10.7* 08/27/2013 0836   CALCIUM 9.5 08/18/2013 0432   CALCIUM 9.8 06/05/2012 0822   ALKPHOS 113 08/27/2013 0836   ALKPHOS 122* 08/13/2013 1004   ALKPHOS 101* 06/05/2012 0822   AST 24 08/27/2013 0836   AST 52* 08/13/2013 1004   AST 41* 06/05/2012 0822   ALT 19 08/27/2013 0836   ALT 37 08/13/2013 1004   ALT 25 06/05/2012 0822   BILITOT 0.67 08/27/2013 0836   BILITOT 0.8 08/13/2013 1004   BILITOT 1.30 06/05/2012 0822       Studies/Results: Ct  Abdomen Pelvis Wo Contrast  08/27/2013   CLINICAL DATA:  Colon cancer followup  EXAM: CT ABDOMEN AND PELVIS WITHOUT CONTRAST  TECHNIQUE: Multidetector CT imaging of the abdomen and pelvis was performed following the standard protocol without intravenous contrast.  COMPARISON:  05/25/2013  FINDINGS: There is a moderate size left pleural effusion which is similar to decreased in volume from previous exam. No focal liver abnormality identified. Gallbladder is normal. No biliary dilatation identified. Normal appearance of the pancreas. The spleen is unremarkable.  The adrenal glands are both normal. 2 mm stone is identified  within the upper pole of the right kidney, image 19/ series 2. Tiny stone noted within the midpole of the left kidney, image 24/series 2. There is a low attenuation structure within the inferior pole of the left kidney which likely represents a small cyst. The urinary bladder appears within normal limits. Prostate gland is enlarged.  Calcified atherosclerotic disease affects the abdominal aorta. The infrarenal abdominal aorta measures 3.1 cm in AP dimension. This is similar to previous exam. Periaortic lymph node measures 1 cm, image 26/series 2. Previously this measured 8 mm. More distally there is a 1.2 cm periaortic lymph node, image 29/ series 2. Previously this measured 1.1 cm. Soft tissue mass within the right lower quadrant measures 1.3 x 1.8 cm, image 56/ series 2. This is compared with 2.7 x 3.0 cm previously.  There is a small amount of free fluid noted within the pelvis.  The stomach is negative. The small bowel loops are normal in caliber. The colon has a normal caliber. There is no obstructing mass identified. There is laxity of the ventral abdominal wall as noted previously.  The review of the visualized osseous structures is significant for scoliosis and multilevel degenerative disc disease.  IMPRESSION: 1.  No acute findings.  2. Decrease in size of right lower quadrant soft tissue mass surrounding surgical clips.  3. Borderline enlarged periaortic lymph nodes are again noted. These are stable to slightly increased from previous exam.   Electronically Signed   By: Signa Kell M.D.   On: 08/27/2013 11:18   Dg Chest 2 View  08/16/2013   CLINICAL DATA:  CHF, pleural effusion  EXAM: CHEST  2 VIEW  COMPARISON:  08/14/2013  FINDINGS: Cardiomegaly with mild interstitial edema, improved. Small to moderate left pleural effusion, unchanged. Suspected trace right pleural effusion.  No pneumothorax.  Postsurgical changes related to prior CABG.  Degenerative changes of the visualized thoracolumbar spine.   IMPRESSION: Cardiomegaly with mild interstitial edema, improved.  Small to moderate left pleural effusion, unchanged. Suspected trace right pleural effusion.   Electronically Signed   By: Charline Bills M.D.   On: 08/16/2013 09:54   Dg Chest Port 1 View  08/14/2013   CLINICAL DATA:  Respiratory failure.  EXAM: PORTABLE CHEST - 1 VIEW  COMPARISON:  08/13/2013.  FINDINGS: Pulmonary edema with left-sided pleural effusion. Consolidation left base may be related to pleural effusion and atelectasis although limiting evaluation for the possibility of underlying infiltrate or mass.  Cardiomegaly.  Calcified mildly tortuous aorta.  IMPRESSION: Pulmonary edema with minimal improvement in aeration left upper lung zone.  Consolidation left base remains where pleural effusion is noted as detailed above.  Cardiomegaly.  Calcified tortuous aorta   Electronically Signed   By: Bridgett Larsson   On: 08/14/2013 07:50   Dg Chest Portable 1 View  08/13/2013   *RADIOLOGY REPORT*  Clinical Data: Shortness of breath, BiPap, initial encounter.  PORTABLE CHEST -  1 VIEW  Comparison: 09/24/2011; 04/11/2011  Findings:  Grossly unchanged enlarged cardiac silhouette and mediastinal contours.  Post median sternotomy and CABG.  Pulmonary vasculature is indistinct with cephalization of flow.  Interval development of small to moderate sized left-sided and small right-sided pleural effusions with associated perihilar and bibasilar opacities, left greater than right.  No definite pneumothorax.  Grossly unchanged bones.  IMPRESSION: Findings compatible with pulmonary edema with small to moderate- sized bilateral effusions and associated bibasilar opacities, left greater than right, likely atelectasis.   Original Report Authenticated By: Tacey Ruiz, MD    Medications: I have reviewed the patient's current medications.  Assessment/Plan:  1. Stage IV colon cancer with peritoneal metastases at diagnosis April 2012. He was started on Xeloda on  a 1 week on/1 week off schedule. Restaging CT evaluation 09/24/2011 showed resolution of peritoneal metastases. Restaging CT evaluation 10/09/2012 showed a new small area of soft tissue density in the area of previous surgical clips in the right pelvis measuring approximately 2.2 x 2.8 cm. Scan was otherwise unchanged. Xeloda continued. Followup scan on 02/02/2013 showed the area of concern in the right lower quadrant was more prominent. Xeloda was placed on hold. He completed a course of radiation therapy to the right pelvis on 03/27/2013. 2. Hand-foot syndrome secondary to Xeloda. Resolved.  3. Coronary artery disease. 4. Valvular heart disease. 5. Cerebrovascular disease status post right carotid endarterectomy. 6. Essential hypertension. 7. Hyperlipidemia. 8. Sensorineural hearing loss. 9. Hospitalization 08/13/2013 through 08/18/2013 with acute respiratory failure, acute on chronic systolic heart failure. He is being followed closely by cardiology.  Disposition-Mr. Squier appears stable from an oncology standpoint. The recent restaging evaluation showed improvement in the size of the right lower quadrant soft tissue mass and overall stability of periaortic lymph nodes. We will continue to follow on an observation approach with routine labs and CT scans. We will schedule the next set of labs and scans at a three-month interval to be followed by an office visit. He or his wife will contact the office in the interim with any problems.  Lonna Cobb ANP/GNP-BC

## 2013-09-04 ENCOUNTER — Encounter: Payer: Self-pay | Admitting: *Deleted

## 2013-09-07 ENCOUNTER — Ambulatory Visit (INDEPENDENT_AMBULATORY_CARE_PROVIDER_SITE_OTHER): Payer: Medicare Other | Admitting: Cardiovascular Disease

## 2013-09-07 ENCOUNTER — Encounter: Payer: Self-pay | Admitting: Cardiovascular Disease

## 2013-09-07 VITALS — BP 128/78 | HR 64 | Ht 67.0 in | Wt 131.0 lb

## 2013-09-07 DIAGNOSIS — I080 Rheumatic disorders of both mitral and aortic valves: Secondary | ICD-10-CM

## 2013-09-07 DIAGNOSIS — I255 Ischemic cardiomyopathy: Secondary | ICD-10-CM

## 2013-09-07 DIAGNOSIS — I2589 Other forms of chronic ischemic heart disease: Secondary | ICD-10-CM

## 2013-09-07 MED ORDER — NIACIN ER (ANTIHYPERLIPIDEMIC) 500 MG PO TBCR: 1500.0000 mg | EXTENDED_RELEASE_TABLET | Freq: Every day | ORAL | Status: AC

## 2013-09-07 NOTE — Assessment & Plan Note (Addendum)
Valve area of 0.92 cm squared. We will continue to monitor this. I do not believe he is a candidate for aortic valve replacement or TAVR

## 2013-09-07 NOTE — Patient Instructions (Signed)
Follow up with Dr. Berry in 3 months 

## 2013-09-07 NOTE — Assessment & Plan Note (Signed)
Recent 2-D echo showed EF of approximately 20-25%. He does have moderate aortic stenosis. I saw him in the office 08/05/13. He was admitted to the hospital 08/13/13 with shortness of breath consistent with congestive heart failure. He was diuresed and sent home with a dry weight of 119 pounds. Since being home he feels clinically improved. Does have some mild right lower extremity edema.

## 2013-09-07 NOTE — Progress Notes (Signed)
09/07/2013 Charles Faulkner   16-Feb-1926  [id number]  Primary Physician [name]., MD Primary Cardiologist: [name] MD Charles Faulkner  HPI:  The patient is an 79 year old thin and frail-appearing married Caucasian male who is a patient of Dr. [name]. He has a history of CAD, PVOD, Chronic afib(not on anticoagulation), colon cancer, status post hemicolectomy with small bowel resection due to adenocarcinoma with peritoneal metastases, HLD, AAA, HTN. He had coronary artery bypass grafting in 2002 and subsequent stenting of his RCA ostium by Dr. [name] September 20, 2004. He had right carotid endarterectomy performed by Dr. [name] in 2006 with moderate left ICA stenosis. We have followed his duplex ultrasounds. He also has moderate aortic stenosis with a valve area of 0.92 sq cm by echo May 2012. He has an EF in the 40% to 45% range. His last NST was 40981 and was considered low risk although there was new mild anterolateral ischemia. He has been on chemotherapy, followed by Dr. [name]. He apparently has had some progression of disease by CT scanning and is scheduled to undergo initiation of radiation therapy.  The patient was at the [facility] office for a 2D echo when he developed severe SOB and stated that he could not breath. According to his wife he has been having trouble for the last 1-1/2 to 2 weeks. He sleeps in the recliner and has LEE. He currently denies nausea, vomiting, fever, chest pain, congestion, abdominal pain, hematochezia, melena, Diarrhea, constipation. He has noticed decreased exercise tolerance. He was sent by EMS to the ER; placed on Bi-Pap and given IV lasix. He was discharged on her dry weight 119 pounds. His EF by 2-D echo was approximately 20-25%. Since being at home he is gained 2 or 3 pounds though his shortness of breath and chest pain is markedly improved.    Current Outpatient Prescriptions  Medication Sig Dispense Refill  .  atenolol (TENORMIN) 25 MG tablet Take 0.5 tablets (12.5 mg total) by mouth 2 (two) times daily.  60 tablet  5  . atorvastatin (LIPITOR) 40 MG tablet Take 40 mg by mouth daily.      . clopidogrel (PLAVIX) 75 MG tablet Take 1 tablet (75 mg total) by mouth daily.  30 tablet  6  . digoxin 62.5 MCG TABS Take 0.0625 mg by mouth daily.  30 tablet  5  . furosemide (LASIX) 40 MG tablet Take 1 tablet (40 mg total) by mouth daily.  30 tablet  5  . lansoprazole (PREVACID) 15 MG capsule Take 15 mg by mouth daily.        Marland Kitchen losartan (COZAAR) 25 MG tablet Take 1 tablet (25 mg total) by mouth daily.  30 tablet  5  . Multiple Vitamins-Minerals ([employer] SPECTRAVITE SENIOR PO) Take 1 tablet by mouth daily.        . niacin (NIASPAN) 500 MG CR tablet Take 3 tablets (1,500 mg total) by mouth at bedtime.  90 tablet  9  . nitroGLYCERIN (NITROSTAT) 0.4 MG SL tablet Place 1 tablet (0.4 mg total) under the tongue every 5 (five) minutes as needed for chest pain.  25 tablet  6  . polyethylene glycol powder (GLYCOLAX/MIRALAX) powder Take 17 g by mouth daily as needed (for constipation).       Marland Kitchen spironolactone (ALDACTONE) 25 MG tablet Take 0.5 tablets (12.5 mg total) by mouth daily.  30 tablet  5  . ZETIA 10 MG tablet TAKE 1 TABLET DAILY.  30 tablet  11   No current facility-administered medications for this visit.    Allergies  Allergen Reactions  . Aspirin   . Metoprolol Tartrate     REACTION: hives    History   Social History  . Marital Status: Married    Spouse Name: N/A    Number of Children: 0  . Years of Education: N/A   Occupational History  . Not on file.   Social History Main Topics  . Smoking status: Former Games developer  . Smokeless tobacco: Never Used     Comment: quit remotely  . Alcohol Use: No  . Drug Use: No  . Sexual Activity: No   Other Topics Concern  . Not on file   Social History Narrative  . No narrative on file     Review of Systems: General: negative for chills, fever, night sweats  or weight changes.  Cardiovascular: negative for chest pain, dyspnea on exertion, edema, orthopnea, palpitations, paroxysmal nocturnal dyspnea or shortness of breath Dermatological: negative for rash Respiratory: negative for cough or wheezing Urologic: negative for hematuria Abdominal: negative for nausea, vomiting, diarrhea, bright red blood per rectum, melena, or hematemesis Neurologic: negative for visual changes, syncope, or dizziness All other systems reviewed and are otherwise negative except as noted above.    Blood pressure 128/78, pulse 64, height 5\' 7"  (1.702 m), weight 131 lb (59.421 kg).  General appearance: alert and no distress Neck: no adenopathy, no JVD, supple, symmetrical, trachea midline, thyroid not enlarged, symmetric, no tenderness/mass/nodules and bilateral carotid bruits Lungs: clear to auscultation bilaterally Heart: irregularly irregular rate with a 2/6 outflow tract murmur consistent with aortic stenosis Extremities: 1-2+ right lower extremity pitting edema  EKG not performed today  ASSESSMENT AND PLAN:   Cardiomyopathy, ischemic- 20-25% 9/14 Recent 2-D echo showed EF of approximately 20-25%. He does have moderate aortic stenosis. I saw him in the office 08/05/13. He was admitted to the hospital 08/13/13 with shortness of breath consistent with congestive heart failure. He was diuresed and sent home with a dry weight of 119 pounds. Since being home he feels clinically improved. Does have some mild right lower extremity edema.  Mitral to moderate aortic stenosis and aortic insufficiency Valve area of 0.92 cm squared. We will continue to monitor this. I do not believe he is a candidate for aortic valve replacement or TAVR      [name] MD [facility], [facility] 09/07/2013 10:55 AM

## 2013-09-22 LAB — BASIC METABOLIC PANEL
BUN: 18 mg/dL (ref 6–23)
CO2: 28 mEq/L (ref 19–32)
Calcium: 10.2 mg/dL (ref 8.4–10.5)
Chloride: 105 mEq/L (ref 96–112)
Creat: 1.06 mg/dL (ref 0.50–1.35)
Glucose, Bld: 106 mg/dL — ABNORMAL HIGH (ref 70–99)
Potassium: 4.1 mEq/L (ref 3.5–5.3)
Sodium: 142 mEq/L (ref 135–145)

## 2013-09-23 ENCOUNTER — Ambulatory Visit (INDEPENDENT_AMBULATORY_CARE_PROVIDER_SITE_OTHER): Payer: Medicare Other | Admitting: Cardiology

## 2013-09-23 ENCOUNTER — Encounter: Payer: Self-pay | Admitting: Cardiology

## 2013-09-23 VITALS — BP 100/60 | HR 80 | Ht 67.0 in | Wt 128.0 lb

## 2013-09-23 DIAGNOSIS — I255 Ischemic cardiomyopathy: Secondary | ICD-10-CM

## 2013-09-23 DIAGNOSIS — Z951 Presence of aortocoronary bypass graft: Secondary | ICD-10-CM

## 2013-09-23 DIAGNOSIS — I359 Nonrheumatic aortic valve disorder, unspecified: Secondary | ICD-10-CM

## 2013-09-23 DIAGNOSIS — I4891 Unspecified atrial fibrillation: Secondary | ICD-10-CM

## 2013-09-23 DIAGNOSIS — I2589 Other forms of chronic ischemic heart disease: Secondary | ICD-10-CM

## 2013-09-23 DIAGNOSIS — I080 Rheumatic disorders of both mitral and aortic valves: Secondary | ICD-10-CM

## 2013-09-23 DIAGNOSIS — I350 Nonrheumatic aortic (valve) stenosis: Secondary | ICD-10-CM

## 2013-09-23 DIAGNOSIS — C182 Malignant neoplasm of ascending colon: Secondary | ICD-10-CM

## 2013-09-23 DIAGNOSIS — I482 Chronic atrial fibrillation, unspecified: Secondary | ICD-10-CM

## 2013-09-23 NOTE — Assessment & Plan Note (Signed)
No symptoms at this time.  

## 2013-09-23 NOTE — Assessment & Plan Note (Signed)
No CHF, wgt stable

## 2013-09-23 NOTE — Progress Notes (Signed)
09/23/2013 Charles Faulkner   [birth year]  [id number]  Primary Physicia Charles Faulkner., MD Primary Cardiologist: Dr Charles Faulkner  HPI:  79 y/o well known to Dr Charles Faulkner with a history of CAD, s/p CABG X 5 in '02 with a PCI in 2005. He just had a Myoview 08/09/13 that was low risk with some scarring but no new changes. He has been Rx'd for colon cancer with surgery, radiation, and chemotherapy. We saw him in the office in September for dyspnea. He was in the office for an echo and had acute respiratory failure with  near respiratory arrest. He was transported to [facility] and treated for CHF. He has done well since. His wife keeps meticulous records and his wgt is stable at 121 on her scales at home (128 here). He takes Lasix BID every third day, otherwise he takes it daily. A recent BMP showed normal BUN/SCr and K+.     Current Outpatient Prescriptions  Medication Sig Dispense Refill  . atenolol (TENORMIN) 25 MG tablet Take 0.5 tablets (12.5 mg total) by mouth 2 (two) times daily.  60 tablet  5  . atorvastatin (LIPITOR) 40 MG tablet Take 40 mg by mouth daily.      . clopidogrel (PLAVIX) 75 MG tablet Take 1 tablet (75 mg total) by mouth daily.  30 tablet  6  . digoxin 62.5 MCG TABS Take 0.0625 mg by mouth daily.  30 tablet  5  . furosemide (LASIX) 40 MG tablet Take 1 tablet (40 mg total) by mouth daily.  30 tablet  5  . lansoprazole (PREVACID) 15 MG capsule Take 15 mg by mouth daily.        Marland Kitchen losartan (COZAAR) 25 MG tablet Take 1 tablet (25 mg total) by mouth daily.  30 tablet  5  . Multiple Vitamins-Minerals ([employer] SPECTRAVITE SENIOR PO) Take 1 tablet by mouth daily.        . niacin (NIASPAN) 500 MG CR tablet Take 3 tablets (1,500 mg total) by mouth at bedtime.  90 tablet  9  . nitroGLYCERIN (NITROSTAT) 0.4 MG SL tablet Place 1 tablet (0.4 mg total) under the tongue every 5 (five) minutes as needed for chest pain.  25 tablet  6  . polyethylene glycol powder (GLYCOLAX/MIRALAX) powder Take 17 g by mouth daily as  needed (for constipation).       Marland Kitchen spironolactone (ALDACTONE) 25 MG tablet Take 0.5 tablets (12.5 mg total) by mouth daily.  30 tablet  5  . ZETIA 10 MG tablet TAKE 1 TABLET DAILY.  30 tablet  11   No current facility-administered medications for this visit.    Allergies  Allergen Reactions  . Aspirin   . Metoprolol Tartrate     REACTION: hives    History   Social History  . Marital Status: Married    Spouse Name: N/A    Number of Children: 0  . Years of Education: N/A   Occupational History  . Not on file.   Social History Main Topics  . Smoking status: Former Games developer  . Smokeless tobacco: Never Used     Comment: quit remotely  . Alcohol Use: No  . Drug Use: No  . Sexual Activity: No   Other Topics Concern  . Not on file   Social History Narrative  . No narrative on file     Review of Systems: General: negative for chills, fever, night sweats or weight changes.  Cardiovascular: negative for chest pain, dyspnea on exertion, edema, orthopnea,  palpitations, paroxysmal nocturnal dyspnea or shortness of breath Dermatological: negative for rash Respiratory: negative for cough or wheezing Urologic: negative for hematuria Abdominal: negative for nausea, vomiting, diarrhea, bright red blood per rectum, melena, or hematemesis Neurologic: negative for visual changes, syncope, or dizziness All other systems reviewed and are otherwise negative except as noted above.    Blood pressure 100/60, pulse 80, height 5\' 7"  (1.702 m), weight 128 lb (58.06 kg).  General appearance: alert, cooperative, cachectic, no distress and HOH Neck: no JVD Lungs: kyphosis, decreased breath breath sounds at bases. Heart: irregularly irregular rhythm and 2/6 systolic murmur Extremities: trace edema   ASSESSMENT AND PLAN:   Cardiomyopathy, ischemic- 20-25% 9/14 No CHF, wgt stable  Aortic stenosis, severe No symptoms at this time  Moderate aortic stenosis and aortic insufficiency No  symptoms at this time  Stage IV cancer of cecum T4b N2b M1, peritoneal mets Surgery, chemo, radiation. He is currently on 3 month CT surveillance  S/P CABG x 5 in '02, PCI '05 No angina  Chronic atrial fibrillation Rate controlled   PLAN  I think we push out his office visit to 2 months. His wife knows to call us if he has any problems and I told her we would not hesitate to bring him back to the office before two months if needed. If he has any near syncope or syncope we might want to back off his Cozaar with his AS.  Charles Faulkner KPA-C 09/23/2013 2:05 PM

## 2013-09-23 NOTE — Patient Instructions (Signed)
Same medical treatment. Call if any changes. Follow up with [facility] in 2 months  Jannet Calip [facility] PA-C 09/23/2013 1:59 PM

## 2013-09-23 NOTE — Assessment & Plan Note (Signed)
Surgery, chemo, radiation. He is currently on 3 month CT surveillance

## 2013-09-23 NOTE — Assessment & Plan Note (Signed)
Rate controlled 

## 2013-09-23 NOTE — Assessment & Plan Note (Signed)
No angina 

## 2013-10-17 ENCOUNTER — Other Ambulatory Visit: Payer: Self-pay | Admitting: Cardiovascular Disease

## 2013-10-19 ENCOUNTER — Other Ambulatory Visit: Payer: Self-pay | Admitting: *Deleted

## 2013-10-19 MED ORDER — DIGOXIN 62.5 MCG PO TABS: 0.0625 mg | ORAL_TABLET | Freq: Every day | ORAL | Status: AC

## 2013-10-19 NOTE — Telephone Encounter (Signed)
Rx was sent to pharmacy electronically. 

## 2013-11-12 ENCOUNTER — Other Ambulatory Visit: Payer: Self-pay | Admitting: Cardiovascular Disease

## 2013-11-12 NOTE — Telephone Encounter (Signed)
Rx was sent to pharmacy electronically. 

## 2013-11-17 ENCOUNTER — Encounter: Payer: Self-pay | Admitting: Cardiology

## 2013-11-17 ENCOUNTER — Ambulatory Visit (INDEPENDENT_AMBULATORY_CARE_PROVIDER_SITE_OTHER): Payer: Medicare Other | Admitting: Cardiology

## 2013-11-17 VITALS — BP 130/70 | HR 87 | Ht 67.0 in | Wt 132.0 lb

## 2013-11-17 DIAGNOSIS — I2510 Atherosclerotic heart disease of native coronary artery without angina pectoris: Secondary | ICD-10-CM

## 2013-11-17 DIAGNOSIS — Z79899 Other long term (current) drug therapy: Secondary | ICD-10-CM

## 2013-11-17 DIAGNOSIS — I4891 Unspecified atrial fibrillation: Secondary | ICD-10-CM

## 2013-11-17 DIAGNOSIS — I482 Chronic atrial fibrillation, unspecified: Secondary | ICD-10-CM

## 2013-11-17 DIAGNOSIS — Z951 Presence of aortocoronary bypass graft: Secondary | ICD-10-CM

## 2013-11-17 DIAGNOSIS — I2589 Other forms of chronic ischemic heart disease: Secondary | ICD-10-CM

## 2013-11-17 DIAGNOSIS — I255 Ischemic cardiomyopathy: Secondary | ICD-10-CM

## 2013-11-17 DIAGNOSIS — C182 Malignant neoplasm of ascending colon: Secondary | ICD-10-CM

## 2013-11-17 NOTE — Patient Instructions (Signed)
Your physician recommends that you return for lab work today  Your physician recommends that you schedule a follow-up appointment in: 3 months

## 2013-11-17 NOTE — Assessment & Plan Note (Signed)
Wgt stable, currently compensated

## 2013-11-17 NOTE — Progress Notes (Signed)
11/17/2013 Charles Faulkner   [birth year]  [id number]  Primary Physicia [name]., MD Primary Cardiologist: Dr [name]  HPI:  79 y/o well known to Dr [name] with a history of CAD, s/p CABG X 5 in '02 with a PCI in 2005. He had a Myoview 08/09/13 that was low risk with some scarring but no new changes. He has been Rx'd for colon cancer with surgery, radiation, and chemotherapy. We saw him in the office in September for dyspnea. He was in the office for an echo and had acute respiratory failure with near respiratory arrest. He was transported to [facility] and treated for CHF. His echo showed an EF of 25-30% with elevated filling pressures and moderate AR.  He has done well since. His wife keeps meticulous records and his wgt is stable at 121- 123 on her scales at home (128 here). He takes Lasix BID every 4th day, otherwise he takes it daily.     Current Outpatient Prescriptions  Medication Sig Dispense Refill  . atorvastatin (LIPITOR) 40 MG tablet ONE TABLET EVERY EVENING  30 tablet  5  . clopidogrel (PLAVIX) 75 MG tablet Take 1 tablet (75 mg total) by mouth daily.  30 tablet  6  . Digoxin 62.5 MCG TABS Take 0.0625 mg by mouth daily.  30 tablet  5  . furosemide (LASIX) 40 MG tablet Take 1 tablet (40 mg total) by mouth daily.  30 tablet  5  . lansoprazole (PREVACID) 15 MG capsule Take 15 mg by mouth daily.        Marland Kitchen losartan (COZAAR) 25 MG tablet Take 1 tablet (25 mg total) by mouth daily.  30 tablet  5  . Multiple Vitamins-Minerals ([employer] SPECTRAVITE SENIOR PO) Take 1 tablet by mouth daily.        . niacin (NIASPAN) 500 MG CR tablet Take 3 tablets (1,500 mg total) by mouth at bedtime.  90 tablet  9  . nitroGLYCERIN (NITROSTAT) 0.4 MG SL tablet Place 1 tablet (0.4 mg total) under the tongue every 5 (five) minutes as needed for chest pain.  25 tablet  6  . polyethylene glycol powder (GLYCOLAX/MIRALAX) powder Take 17 g by mouth daily as needed (for constipation).       Marland Kitchen spironolactone (ALDACTONE) 25 MG  tablet Take 0.5 tablets (12.5 mg total) by mouth daily.  30 tablet  5  . ZETIA 10 MG tablet TAKE 1 TABLET DAILY.  30 tablet  5  . atenolol (TENORMIN) 25 MG tablet Take 0.5 tablets (12.5 mg total) by mouth 2 (two) times daily.  30 tablet  10   No current facility-administered medications for this visit.    Allergies  Allergen Reactions  . Aspirin   . Metoprolol Tartrate     REACTION: hives    History   Social History  . Marital Status: Married    Spouse Name: N/A    Number of Children: 0  . Years of Education: N/A   Occupational History  . Not on file.   Social History Main Topics  . Smoking status: Former Smoker    Types: Cigarettes    Quit date: 11/18/1991  . Smokeless tobacco: Never Used     Comment: quit remotely  . Alcohol Use: No  . Drug Use: No  . Sexual Activity: No   Other Topics Concern  . Not on file   Social History Narrative  . No narrative on file     Review of Systems: General: negative for chills, fever,  night sweats or weight changes.  Cardiovascular: negative for chest pain, dyspnea on exertion, edema, orthopnea, palpitations, paroxysmal nocturnal dyspnea or shortness of breath Dermatological: negative for rash Respiratory: negative for cough or wheezing Urologic: negative for hematuria Abdominal: negative for nausea, vomiting, diarrhea, bright red blood per rectum, melena, or hematemesis Neurologic: negative for visual changes, syncope, or dizziness All other systems reviewed and are otherwise negative except as noted above.    Blood pressure 130/70, pulse 87, height 5\' 7"  (1.702 m), weight 132 lb (59.875 kg).  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  Chronic atrial fibrillation .  S/P CABG x 5 in '02, PCI '05 .    PLAN  I did not change his medications. His wgt is stable and he and his wife say he is "doing great". I did order a follow up BMP.   Fabiano Ginley KPA-C 11/17/2013 1:00 PM

## 2013-11-18 ENCOUNTER — Encounter: Payer: Self-pay | Admitting: Cardiology

## 2013-11-23 ENCOUNTER — Other Ambulatory Visit (HOSPITAL_BASED_OUTPATIENT_CLINIC_OR_DEPARTMENT_OTHER): Payer: Medicare Other

## 2013-11-23 ENCOUNTER — Ambulatory Visit (HOSPITAL_COMMUNITY)
Admission: RE | Admit: 2013-11-23 | Discharge: 2013-11-23 | Disposition: A | Discharge status: Home / Self Care | Payer: Medicare Other | Source: Ambulatory Visit | Attending: Nurse Practitioner | Admitting: Nurse Practitioner

## 2013-11-23 ENCOUNTER — Encounter (HOSPITAL_COMMUNITY): Payer: Self-pay

## 2013-11-23 DIAGNOSIS — I714 Abdominal aortic aneurysm, without rupture, unspecified: Secondary | ICD-10-CM | POA: Insufficient documentation

## 2013-11-23 DIAGNOSIS — E278 Other specified disorders of adrenal gland: Secondary | ICD-10-CM | POA: Insufficient documentation

## 2013-11-23 DIAGNOSIS — C182 Malignant neoplasm of ascending colon: Secondary | ICD-10-CM

## 2013-11-23 DIAGNOSIS — M47817 Spondylosis without myelopathy or radiculopathy, lumbosacral region: Secondary | ICD-10-CM | POA: Insufficient documentation

## 2013-11-23 DIAGNOSIS — Z9221 Personal history of antineoplastic chemotherapy: Secondary | ICD-10-CM | POA: Insufficient documentation

## 2013-11-23 DIAGNOSIS — M4120 Other idiopathic scoliosis, site unspecified: Secondary | ICD-10-CM | POA: Insufficient documentation

## 2013-11-23 DIAGNOSIS — C180 Malignant neoplasm of cecum: Secondary | ICD-10-CM | POA: Insufficient documentation

## 2013-11-23 DIAGNOSIS — R599 Enlarged lymph nodes, unspecified: Secondary | ICD-10-CM | POA: Insufficient documentation

## 2013-11-23 DIAGNOSIS — I2510 Atherosclerotic heart disease of native coronary artery without angina pectoris: Secondary | ICD-10-CM | POA: Insufficient documentation

## 2013-11-23 DIAGNOSIS — N4289 Other specified disorders of prostate: Secondary | ICD-10-CM | POA: Insufficient documentation

## 2013-11-23 DIAGNOSIS — I7789 Other specified disorders of arteries and arterioles: Secondary | ICD-10-CM | POA: Insufficient documentation

## 2013-11-23 DIAGNOSIS — J90 Pleural effusion, not elsewhere classified: Secondary | ICD-10-CM | POA: Insufficient documentation

## 2013-11-23 DIAGNOSIS — Z923 Personal history of irradiation: Secondary | ICD-10-CM | POA: Insufficient documentation

## 2013-11-23 DIAGNOSIS — I517 Cardiomegaly: Secondary | ICD-10-CM | POA: Insufficient documentation

## 2013-11-23 LAB — COMPREHENSIVE METABOLIC PANEL (CC13)
ALT: 20 U/L (ref 0–55)
AST: 28 U/L (ref 5–34)
Alkaline Phosphatase: 102 U/L (ref 40–150)
Anion Gap: 9 mEq/L (ref 3–11)
BUN: 15.8 mg/dL (ref 7.0–26.0)
CO2: 27 mEq/L (ref 22–29)
Chloride: 103 mEq/L (ref 98–109)
Creatinine: 0.9 mg/dL (ref 0.7–1.3)
Glucose: 118 mg/dl (ref 70–140)
Potassium: 4.1 mEq/L (ref 3.5–5.1)
Sodium: 139 mEq/L (ref 136–145)
Total Bilirubin: 0.75 mg/dL (ref 0.20–1.20)
Total Protein: 7.4 g/dL (ref 6.4–8.3)

## 2013-11-23 LAB — CBC WITH DIFFERENTIAL/PLATELET
BASO%: 0.3 % (ref 0.0–2.0)
Basophils Absolute: 0 10*3/uL (ref 0.0–0.1)
Eosinophils Absolute: 0.2 10*3/uL (ref 0.0–0.5)
HCT: 38.9 % (ref 38.4–49.9)
HGB: 13.2 g/dL (ref 13.0–17.1)
LYMPH%: 12.6 % — ABNORMAL LOW (ref 14.0–49.0)
MCHC: 34 g/dL (ref 32.0–36.0)
MCV: 101.4 fL — ABNORMAL HIGH (ref 79.3–98.0)
MONO#: 0.8 10*3/uL (ref 0.1–0.9)
MONO%: 11.4 % (ref 0.0–14.0)
NEUT#: 4.8 10*3/uL (ref 1.5–6.5)
NEUT%: 73.4 % (ref 39.0–75.0)
Platelets: 140 10*3/uL (ref 140–400)
RBC: 3.84 10*6/uL — ABNORMAL LOW (ref 4.20–5.82)
WBC: 6.6 10*3/uL (ref 4.0–10.3)
lymph#: 0.8 10*3/uL — ABNORMAL LOW (ref 0.9–3.3)

## 2013-11-23 LAB — CEA: CEA: 4 ng/mL (ref 0.0–5.0)

## 2013-11-24 ENCOUNTER — Ambulatory Visit: Payer: Medicare Other | Admitting: Cardiology

## 2013-11-30 LAB — BASIC METABOLIC PANEL
BUN: 22 mg/dL (ref 6–23)
CO2: 28 mEq/L (ref 19–32)
Calcium: 9.8 mg/dL (ref 8.4–10.5)
Chloride: 102 mEq/L (ref 96–112)
Creat: 0.92 mg/dL (ref 0.50–1.35)
Glucose, Bld: 103 mg/dL — ABNORMAL HIGH (ref 70–99)
Potassium: 3.9 mEq/L (ref 3.5–5.3)
Sodium: 142 mEq/L (ref 135–145)

## 2013-12-01 ENCOUNTER — Ambulatory Visit (HOSPITAL_BASED_OUTPATIENT_CLINIC_OR_DEPARTMENT_OTHER): Payer: Medicare Other | Admitting: Oncology

## 2013-12-01 ENCOUNTER — Telehealth: Payer: Self-pay | Admitting: Oncology

## 2013-12-01 VITALS — BP 111/41 | HR 87 | Temp 97.0°F | Resp 19 | Ht 67.0 in | Wt 130.6 lb

## 2013-12-01 DIAGNOSIS — C182 Malignant neoplasm of ascending colon: Secondary | ICD-10-CM

## 2013-12-01 DIAGNOSIS — J90 Pleural effusion, not elsewhere classified: Secondary | ICD-10-CM

## 2013-12-01 DIAGNOSIS — C775 Secondary and unspecified malignant neoplasm of intrapelvic lymph nodes: Secondary | ICD-10-CM

## 2013-12-01 DIAGNOSIS — C786 Secondary malignant neoplasm of retroperitoneum and peritoneum: Secondary | ICD-10-CM

## 2013-12-01 DIAGNOSIS — C180 Malignant neoplasm of cecum: Secondary | ICD-10-CM

## 2013-12-01 NOTE — Telephone Encounter (Signed)
Gave pt appt for lab and MD on April, alsdo gave pt oral contrast for CT

## 2013-12-02 NOTE — Progress Notes (Signed)
Hematology and Oncology Follow Up Visit  HATTAN PICHON QI:[id number] 1926-11-02 80 y.o. 12/02/2013 11:41 AM   Principle Diagnosis: Encounter Diagnosis  Name Primary?  . Stage IV cancer of cecum T4b N2b M1, peritoneal mets Yes     Interim History:   Followup visit for this 80 year old man with multiple medical problems including advanced coronary artery disease status post bypass grafting status post stent, valvular heart disease, peripheral vascular disease. He was diagnosed with stage IV colon cancer with diffuse peritoneal metastases at diagnosis in April 2012. He underwent resection of the primary lesion in the cecum. He was initially treated with  single agent oral Xeloda chemotherapy with an excellent and ongoing response. There was initial regression of the obvious soft tissue disease on CT scans. CEA has not been a good disease activity marker in this man since it has been normal from time of diagnosis until now.  . On a CT scan done 10/09/2012 there was a new area of soft tissue density in the region of previous surgical clips in the right pelvis which radiologist measured at 2.2 x 2.8 cm. There were no other changes to suggest progression. I initially elected to continue his Xeloda at the same dose and schedule. A followup scan was done on 02/02/2013 and I personally reviewed these images. The area of concern in the right lower quadrant was now more prominent measuring 3.0 x 2.7 cm. No peritoneal nodularity or ascites is noted. No liver involvement was suspected.  Since the disease was localized to the right lower quadrant in an area where there were easily visible surgical clips, I felt that he might be a candidate for a local procedure either, surgery or radiation. His surgeon deferred to radiation oncology who did think palliative radiation would be reasonable. He was treated with 37.5 gray in 15 fractions between April 14 and 03/27/2013 by Dr. Kyung Rudd. He tolerated treatments well.  I  stopped his Xeloda chemotherapy.  Followup CT scan done in anticipation of today's visit on 11/23/13 shows stable appearance of the soft tissue mass in the area of concern with no further growth. Radiologist mentions a single area adjacent to the aorta at the level of the left renal artery and inferior to it which is slightly larger now 2 x 1.4 x 2.4 cm compare with 1.6 x 1.8  x 1.8 cm. No other changes compared with prior studies done as far back as 05/25/2013.  Clinically he continues to do well except for his cardiac condition. He had a 5 day admission to independent hospital in November for acute on chronic congestive heart failure. He has recovered. Breathing back to his baseline.  He has no GI complaints and denies any abdominal pain hematochezia or melena.    Medications: reviewed  Allergies:  Allergies  Allergen Reactions  . Aspirin   . Metoprolol Tartrate     REACTION: hives    Review of Systems: Hematology:  No bleeding or bruising ENT ROS: No sore throat Breast ROS:  Respiratory ROS: See above Cardiovascular ROS:  No chest pain or palpitations Gastrointestinal ROS: See above    Genito-Urinary ROS: Not questioned Musculoskeletal ROS: No muscle or bone pain Neurological ROS: No headache or change in vision Dermatological ROS: No rash Remaining ROS negative.  Physical Exam: Blood pressure 111/41, pulse 87, temperature 97 F (36.1 C), temperature source Oral, resp. rate 19, height 5\' 7"  (1.702 m), weight 130 lb 9.6 oz (59.24 kg). Wt Readings from Last 3 Encounters:  12/01/13  130 lb 9.6 oz (59.24 kg)  11/17/13 132 lb (59.875 kg)  09/23/13 128 lb (58.06 kg)     General appearance: Thin Caucasian man HENNT: Pharynx no erythema, exudate, mass, or ulcer. No thyromegaly or thyroid nodules Lymph nodes: No cervical, supraclavicular, or axillary lymphadenopathy Breasts:  Lungs: Clear to auscultation, resonant to percussion throughout Heart: Regular rhythm, no murmur, no  gallop, no rub, no click, no edema Abdomen: Soft, nontender, normal bowel sounds, no mass, no organomegaly Extremities: no calf tenderness, asymmetric edema right calf versus left from previous saphenous vein harvest Musculoskeletal: no joint deformities GU:  Vascular: Carotid pulses 2+, no bruits,  Neurologic: Alert, oriented, PERRLA,   cranial nerves grossly normal except for decreased hearing, motor strength 5 over 5, reflexes 1+ symmetric, upper body coordination normal, gait normal, Skin: No rash or ecchymosis  Lab Results: CBC W/Diff    Component Value Date/Time   WBC 6.6 11/23/2013 0834   WBC 8.3 08/15/2013 0530   RBC 3.84* 11/23/2013 0834   RBC 3.89* 08/15/2013 0530   HGB 13.2 11/23/2013 0834   HGB 13.3 08/15/2013 0530   HCT 38.9 11/23/2013 0834   HCT 38.6* 08/15/2013 0530   PLT 140 11/23/2013 0834   PLT 171 08/15/2013 0530   MCV 101.4* 11/23/2013 0834   MCV 99.2 08/15/2013 0530   MCH 34.5* 11/23/2013 0834   MCH 34.2* 08/15/2013 0530   MCHC 34.0 11/23/2013 0834   MCHC 34.5 08/15/2013 0530   RDW 15.1* 11/23/2013 0834   RDW 14.3 08/15/2013 0530   LYMPHSABS 0.8* 11/23/2013 0834   LYMPHSABS 1.0 04/16/2011 0615   MONOABS 0.8 11/23/2013 0834   MONOABS 0.8 04/16/2011 0615   EOSABS 0.2 11/23/2013 0834   EOSABS 0.4 04/16/2011 0615   BASOSABS 0.0 11/23/2013 0834   BASOSABS 0.0 04/16/2011 0615     Chemistry      Component Value Date/Time   NA 142 11/30/2013 0828   NA 139 11/23/2013 0835   NA 143 06/05/2012 0822   K 3.9 11/30/2013 0828   K 4.1 11/23/2013 0835   K 3.9 06/05/2012 0822   CL 102 11/30/2013 0828   CL 107 04/01/2013 1048   CL 100 06/05/2012 0822   CO2 28 11/30/2013 0828   CO2 27 11/23/2013 0835   CO2 29 06/05/2012 0822   BUN 22 11/30/2013 0828   BUN 15.8 11/23/2013 0835   BUN 15 06/05/2012 0822   CREATININE 0.92 11/30/2013 0828   CREATININE 0.9 11/23/2013 0835   CREATININE 0.78 08/18/2013 0432      Component Value Date/Time   CALCIUM 9.8 11/30/2013 0828   CALCIUM 10.2 11/23/2013  0835   CALCIUM 9.8 06/05/2012 0822   ALKPHOS 102 11/23/2013 0835   ALKPHOS 122* 08/13/2013 1004   ALKPHOS 101* 06/05/2012 0822   AST 28 11/23/2013 0835   AST 52* 08/13/2013 1004   AST 41* 06/05/2012 0822   ALT 20 11/23/2013 0835   ALT 37 08/13/2013 1004   ALT 25 06/05/2012 0822   BILITOT 0.75 11/23/2013 0835   BILITOT 0.8 08/13/2013 1004   BILITOT 1.30 06/05/2012 [id number]       Radiological Studies: Ct Abdomen Pelvis Wo Contrast  11/23/2013   CLINICAL DATA:  Cecal cancer.  Post chemo and radiation therapy.  EXAM: CT ABDOMEN AND PELVIS WITHOUT CONTRAST  TECHNIQUE: Multidetector CT imaging of the abdomen and pelvis was performed following the standard protocol without intravenous contrast.  COMPARISON:  Several prior exams most recent 08/27/2013 and 05/25/2013.  FINDINGS: The residual  mass/ adenopathy adjacent to surgical clips within the right pelvis and along the right external iliac region appears relatively similar to the previous examination. There has however, been an increase in adenopathy adjacent to the aorta most notable immediately inferior to the left renal artery. Index lymph node at this level measures 2 x 1.4 x 2.4 cm and previously measured 1.6 x 1 x 8 1.8 cm.  Persistent small to slightly moderate size left-sided pleural effusion.  Cardiomegaly with with coronary artery calcification and aortic/mitral valve calcifications.  Atherosclerotic type changes of the abdominal aorta with with abdominal aortic aneurysm measuring up to and 3.1 x 3.8 cm without significant change. Atherosclerotic type changes iliac arteries with dilated the left common iliac artery measuring up to 1.8 cm stable. Atherosclerotic type changes aortic and iliac branch vessels with narrowing.  Dysplastic right hip with pseudo acetabulum unchanged. Scoliosis and degenerative changes lumbar spine.  Low density within the left lobe liver may be a branching vessel rather than mass although evaluation limited.  Renal low-density  structures possibly cysts with a nonobstructing renal calculi/arterial calcification unchanged.  Hyperplasia adrenal glands stable. No obvious splenic or pancreatic lesion.  Decompressed stomach with appearance of thickening of the gastric body/ antrum. Portions of colon and small bowel under distended. Evaluation for tumor at these levels is therefore limited. Third spacing of fluid limits detection of bowel inflammatory process or detection of peritoneal disease. No free intraperitoneal air.  No obvious bladder mass. Mild impression upon the bladder base by prostate gland which is partially calcified. This is unchanged. Clinical and laboratory correlation recommended.  IMPRESSION: The residual mass/ adenopathy adjacent to surgical clips within the right pelvis and along the right external iliac region appears relatively similar to the previous examination. There has however, been an increase in adenopathy adjacent to the aorta most notable immediately inferior to the left renal artery. Index lymph node at this level measures 2 x 1.4 x 2.4 cm and previously measured 1.6 x 1 x 8 1.8 cm.  Persistent small to slightly moderate size left-sided pleural effusion.  Cardiomegaly with with coronary artery calcification.  Abdominal aortic aneurysm measuring up to and 3.1 x 3.8 cm without significant change. Atherosclerotic type changes iliac arteries with dilated the left common iliac artery measuring up to 1.8 cm stable.  Dysplastic right hip with pseudo acetabulum unchanged. Scoliosis and degenerative changes lumbar spine.  Low density within the left lobe liver may be a branching vessel rather than mass although evaluation limited.  Decompressed stomach with appearance of thickening of the gastric body/ antrum. Portions of colon and small bowel under distended. Evaluation for tumor at these levels is therefore limited. Third spacing of fluid limits detection of bowel inflammatory process or detection of peritoneal  disease. No free intraperitoneal air.  Mild impression upon the bladder base by prostate gland which is partially calcified unchanged. Clinical and laboratory correlation recommended.   Electronically Signed   By: Chauncey Cruel M.D.   On: 11/23/2013 10:25    Impression:   #1. Localized progression of colon cancer in the right pelvis.  He has really done remarkably well considering he had metastatic disease at diagnosis almost 3 years ago. I really not concerned with the minimal growth of a single lymph node area on the current scan. He is clinically stable and asymptomatic.  Continue observation alone. I will repeat CT scans again in 3 months.   I will be transitioning out of this practice. I were to change his oncologist  to Dr. Julieanne Manson at time of his next visit here.  #2. Coronary artery disease status post bypass surgery status post coronary stenting.  #3. Mitral and aortic valvular disease.  #4. Cerebrovascular disease status post right carotid endarterectomy.  #5. Peripheral vascular disease.  #6. Bilateral pleural effusions left greater than right. Likely Cardiac origin stable on current CT scan compared with previous study.Currently asymptomatic.  #7. Sensorineural hearing deficits   CC: Patient Care Team: Redmond School, MD as PCP - General (Internal Medicine) Daneil Dolin, MD (Gastroenterology)   Annia Belt, MD 1/7/201511:42 AM

## 2014-01-08 ENCOUNTER — Other Ambulatory Visit: Payer: Self-pay | Admitting: Cardiovascular Disease

## 2014-01-20 ENCOUNTER — Other Ambulatory Visit: Payer: Self-pay | Admitting: *Deleted

## 2014-01-20 ENCOUNTER — Telehealth: Payer: Self-pay | Admitting: Physician Assistant

## 2014-01-20 MED ORDER — FUROSEMIDE 40 MG PO TABS
40.0000 mg | ORAL_TABLET | Freq: Every day | ORAL | Status: DC
Start: 2014-01-20 — End: 2014-02-01

## 2014-01-20 NOTE — Telephone Encounter (Signed)
Done 2/25

## 2014-01-20 NOTE — Telephone Encounter (Signed)
Per answering service-Need his Lasix called in asap.

## 2014-01-20 NOTE — Telephone Encounter (Signed)
Rx was sent to pharmacy electronically. 

## 2014-01-23 ENCOUNTER — Telehealth: Payer: Self-pay | Admitting: *Deleted

## 2014-01-23 ENCOUNTER — Encounter: Payer: Self-pay | Admitting: Oncology

## 2014-01-23 NOTE — Telephone Encounter (Signed)
Mailed provider change letter w/ calendar to pt and cancelled [facility] appt.

## 2014-01-25 ENCOUNTER — Other Ambulatory Visit: Payer: Self-pay | Admitting: Cardiovascular Disease

## 2014-01-25 NOTE — Telephone Encounter (Signed)
E-SENT RX

## 2014-01-25 NOTE — Telephone Encounter (Signed)
FORWARD TO [facility]

## 2014-01-30 ENCOUNTER — Other Ambulatory Visit: Payer: Self-pay | Admitting: Cardiovascular Disease

## 2014-02-01 ENCOUNTER — Other Ambulatory Visit: Payer: Self-pay | Admitting: *Deleted

## 2014-02-01 MED ORDER — FUROSEMIDE 40 MG PO TABS: 40.0000 mg | ORAL_TABLET | Freq: Every day | ORAL | Status: AC

## 2014-02-01 NOTE — Telephone Encounter (Signed)
Rx was sent to pharmacy electronically. 

## 2014-02-08 ENCOUNTER — Other Ambulatory Visit: Payer: Self-pay | Admitting: *Deleted

## 2014-02-08 MED ORDER — LOSARTAN POTASSIUM 25 MG PO TABS: 25.0000 mg | ORAL_TABLET | Freq: Every day | ORAL | Status: AC

## 2014-02-08 NOTE — Telephone Encounter (Signed)
Rx was sent to pharmacy electronically. 

## 2014-02-16 ENCOUNTER — Encounter: Payer: Self-pay | Admitting: Cardiovascular Disease

## 2014-02-16 ENCOUNTER — Ambulatory Visit (INDEPENDENT_AMBULATORY_CARE_PROVIDER_SITE_OTHER): Payer: Medicare Other | Admitting: Cardiovascular Disease

## 2014-02-16 VITALS — BP 128/62 | HR 72 | Ht 67.0 in | Wt 129.0 lb

## 2014-02-16 DIAGNOSIS — I2589 Other forms of chronic ischemic heart disease: Secondary | ICD-10-CM

## 2014-02-16 DIAGNOSIS — I10 Essential (primary) hypertension: Secondary | ICD-10-CM

## 2014-02-16 DIAGNOSIS — E785 Hyperlipidemia, unspecified: Secondary | ICD-10-CM

## 2014-02-16 DIAGNOSIS — I6529 Occlusion and stenosis of unspecified carotid artery: Secondary | ICD-10-CM

## 2014-02-16 DIAGNOSIS — I255 Ischemic cardiomyopathy: Secondary | ICD-10-CM

## 2014-02-16 DIAGNOSIS — I080 Rheumatic disorders of both mitral and aortic valves: Secondary | ICD-10-CM

## 2014-02-16 NOTE — Assessment & Plan Note (Signed)
On statin therapy and the tibia. His most recent lipid profile is followed by his primary care physician

## 2014-02-16 NOTE — Patient Instructions (Signed)
Your physician recommends that you schedule a follow-up appointment in: 3 months with PA  Your physician recommends that you schedule a follow-up appointment in: 6 Months with Dr Gwenlyn Found

## 2014-02-16 NOTE — Progress Notes (Signed)
02/16/2014 Charles Faulkner   01/17/1926  [id number]  Primary Physician Glo Herring., MD Primary Cardiologist: Lorretta Harp MD Renae Gloss   HPI:  The patient is an 80 year old thin and frail-appearing married Caucasian male who is a patient of Dr. Gwenlyn Found. He has a history of CAD, PVOD, Chronic afib(not on anticoagulation), colon cancer, status post hemicolectomy with small bowel resection due to adenocarcinoma with peritoneal metastases, HLD, AAA, HTN. He had coronary artery bypass grafting in 2002 and subsequent stenting of his RCA ostium by Dr. Alla German September 20, 2004. He had right carotid endarterectomy performed by Dr. Drucie Opitz in 2006 with moderate left ICA stenosis. We have followed his duplex ultrasounds. He also has moderate aortic stenosis with a valve area of 0.92 sq cm by echo May 2012. He has an EF in the 40% to 45% range. His last NST was 91014 and was considered low risk although there was new mild anterolateral ischemia. He has been on chemotherapy, followed by Dr. Beryle Beams. He apparently has had some progression of disease by CT scanning and is scheduled to undergo initiation of radiation therapy.  The patient was at the [facility] office for a 2D echo when he developed severe SOB and stated that he could not breath. According to his wife he has been having trouble for the last 1-1/2 to 2 weeks. He sleeps in the recliner and has LEE. He currently denies nausea, vomiting, fever, chest pain, congestion, abdominal pain, hematochezia, melena, Diarrhea, constipation. He has noticed decreased exercise tolerance. He was sent by EMS to the ER; placed on Bi-Pap and given IV lasix.  He was discharged on her dry weight 119 pounds. His EF by 2-D echo was approximately 20-25%.  I saw him 6 months ago. His wife and daily and adjust Lasix accordingly. He is at his dry weight and has 1+ right and minimal left lower extremity edema. He denies chest pain.   Current  Outpatient Prescriptions  Medication Sig Dispense Refill  . atenolol (TENORMIN) 25 MG tablet TAKE 1 TABLET TWICE DAILY  60 tablet  6  . atorvastatin (LIPITOR) 40 MG tablet ONE TABLET EVERY EVENING  30 tablet  5  . clopidogrel (PLAVIX) 75 MG tablet TAKE 1 TABLET (75 MG TOTAL) BY MOUTH DAILY.  30 tablet  6  . Digoxin 62.5 MCG TABS Take 0.0625 mg by mouth daily.  30 tablet  5  . furosemide (LASIX) 40 MG tablet Take 1 tablet (40 mg total) by mouth daily.  30 tablet  4  . lansoprazole (PREVACID) 15 MG capsule Take 15 mg by mouth daily.        Marland Kitchen losartan (COZAAR) 25 MG tablet Take 1 tablet (25 mg total) by mouth daily.  30 tablet  7  . Multiple Vitamins-Minerals ([employer] SPECTRAVITE SENIOR PO) Take 1 tablet by mouth daily.        . niacin (NIASPAN) 500 MG CR tablet Take 3 tablets (1,500 mg total) by mouth at bedtime.  90 tablet  9  . nitroGLYCERIN (NITROSTAT) 0.4 MG SL tablet Place 1 tablet (0.4 mg total) under the tongue every 5 (five) minutes as needed for chest pain.  25 tablet  6  . polyethylene glycol powder (GLYCOLAX/MIRALAX) powder Take 17 g by mouth daily as needed (for constipation).       Marland Kitchen spironolactone (ALDACTONE) 25 MG tablet Take 0.5 tablets (12.5 mg total) by mouth daily.  30 tablet  5  . ZETIA 10 MG tablet TAKE  1 TABLET DAILY.  30 tablet  5   No current facility-administered medications for this visit.    Allergies  Allergen Reactions  . Aspirin   . Metoprolol Tartrate     REACTION: hives    History   Social History  . Marital Status: Married    Spouse Name: N/A    Number of Children: 0  . Years of Education: N/A   Occupational History  . Not on file.   Social History Main Topics  . Smoking status: Former Smoker    Types: Cigarettes    Quit date: 11/18/1991  . Smokeless tobacco: Never Used     Comment: quit remotely  . Alcohol Use: No  . Drug Use: No  . Sexual Activity: No   Other Topics Concern  . Not on file   Social History Narrative  . No narrative on  file     Review of Systems: General: negative for chills, fever, night sweats or weight changes.  Cardiovascular: negative for chest pain, dyspnea on exertion, edema, orthopnea, palpitations, paroxysmal nocturnal dyspnea or shortness of breath Dermatological: negative for rash Respiratory: negative for cough or wheezing Urologic: negative for hematuria Abdominal: negative for nausea, vomiting, diarrhea, bright red blood per rectum, melena, or hematemesis Neurologic: negative for visual changes, syncope, or dizziness All other systems reviewed and are otherwise negative except as noted above.    Blood pressure 128/62, pulse 72, height 5\' 7"  (1.702 m), weight 129 lb (58.514 kg).  General appearance: alert and no distress Neck: no adenopathy, no JVD, supple, symmetrical, trachea midline, thyroid not enlarged, symmetric, no tenderness/mass/nodules and soft bilateral carotid bruits Lungs: clear to auscultation bilaterally Heart: soft outflow tract murmur Extremities: 1+ right lower extreme edema  EKG not performed today  ASSESSMENT AND PLAN:   Cardiomyopathy, ischemic- 20-25% 9/14 History of CAD status post coronary artery bypass grafting in 2002 and subsequent stenting of his RCA ostium by Dr. Alla German 09/20/04. He had an EF of 20% and has been hospitalized for CHF. His way to check daily at home and he is on a stable dose of oral diuretics. His symptoms have not changed since I last saw him 6 months ago and he is at his dry weight.  Carotid artery stenosis, unilateral Procedure right carotid endarterectomy with moderate left ICA stenosis. He was neurologically asymptomatic by duplex last checked in September  Moderate aortic stenosis and aortic insufficiency History of moderate uric insufficiency and stenosis followed by 2-D echocardiogram  Benign essential HTN Controlled on current medications  Hyperlipidemia On statin therapy and the tibia. His most recent lipid profile is  followed by his primary care physician      Lorretta Harp MD [facility], [facility] 02/16/2014 11:58 AM

## 2014-02-16 NOTE — Assessment & Plan Note (Signed)
Procedure right carotid endarterectomy with moderate left ICA stenosis. He was neurologically asymptomatic by duplex last checked in September

## 2014-02-16 NOTE — Assessment & Plan Note (Signed)
History of moderate uric insufficiency and stenosis followed by 2-D echocardiogram

## 2014-02-16 NOTE — Assessment & Plan Note (Signed)
History of CAD status post coronary artery bypass grafting in 2002 and subsequent stenting of his RCA ostium by Dr. Alla German 09/20/04. He had an EF of 20% and has been hospitalized for CHF. His way to check daily at home and he is on a stable dose of oral diuretics. His symptoms have not changed since I last saw him 6 months ago and he is at his dry weight.

## 2014-02-16 NOTE — Assessment & Plan Note (Signed)
Controlled on current medications 

## 2014-03-05 ENCOUNTER — Ambulatory Visit (HOSPITAL_COMMUNITY)
Admission: RE | Admit: 2014-03-05 | Discharge: 2014-03-05 | Disposition: A | Discharge status: Home / Self Care | Payer: Medicare Other | Source: Ambulatory Visit | Attending: Oncology | Admitting: Oncology

## 2014-03-05 ENCOUNTER — Encounter (HOSPITAL_COMMUNITY): Payer: Self-pay

## 2014-03-05 ENCOUNTER — Other Ambulatory Visit (HOSPITAL_BASED_OUTPATIENT_CLINIC_OR_DEPARTMENT_OTHER): Payer: Medicare Other

## 2014-03-05 DIAGNOSIS — C182 Malignant neoplasm of ascending colon: Secondary | ICD-10-CM

## 2014-03-05 DIAGNOSIS — Z923 Personal history of irradiation: Secondary | ICD-10-CM | POA: Insufficient documentation

## 2014-03-05 DIAGNOSIS — I714 Abdominal aortic aneurysm, without rupture, unspecified: Secondary | ICD-10-CM | POA: Insufficient documentation

## 2014-03-05 DIAGNOSIS — C189 Malignant neoplasm of colon, unspecified: Secondary | ICD-10-CM | POA: Insufficient documentation

## 2014-03-05 DIAGNOSIS — R599 Enlarged lymph nodes, unspecified: Secondary | ICD-10-CM | POA: Insufficient documentation

## 2014-03-05 DIAGNOSIS — Z9221 Personal history of antineoplastic chemotherapy: Secondary | ICD-10-CM | POA: Insufficient documentation

## 2014-03-05 DIAGNOSIS — K4090 Unilateral inguinal hernia, without obstruction or gangrene, not specified as recurrent: Secondary | ICD-10-CM | POA: Insufficient documentation

## 2014-03-05 DIAGNOSIS — R188 Other ascites: Secondary | ICD-10-CM | POA: Insufficient documentation

## 2014-03-05 DIAGNOSIS — IMO0002 Reserved for concepts with insufficient information to code with codable children: Secondary | ICD-10-CM | POA: Insufficient documentation

## 2014-03-05 DIAGNOSIS — C786 Secondary malignant neoplasm of retroperitoneum and peritoneum: Secondary | ICD-10-CM | POA: Insufficient documentation

## 2014-03-05 LAB — COMPREHENSIVE METABOLIC PANEL (CC13)
ALBUMIN: 2.9 g/dL — AB (ref 3.5–5.0)
ALT: 23 U/L (ref 0–55)
AST: 30 U/L (ref 5–34)
Alkaline Phosphatase: 126 U/L (ref 40–150)
Anion Gap: 10 mEq/L (ref 3–11)
BUN: 17.7 mg/dL (ref 7.0–26.0)
CHLORIDE: 102 meq/L (ref 98–109)
CO2: 28 mEq/L (ref 22–29)
Calcium: 10.5 mg/dL — ABNORMAL HIGH (ref 8.4–10.4)
Creatinine: 0.9 mg/dL (ref 0.7–1.3)
Glucose: 150 mg/dl — ABNORMAL HIGH (ref 70–140)
POTASSIUM: 4.4 meq/L (ref 3.5–5.1)
SODIUM: 140 meq/L (ref 136–145)
Total Bilirubin: 0.81 mg/dL (ref 0.20–1.20)
Total Protein: 7.1 g/dL (ref 6.4–8.3)

## 2014-03-05 LAB — LACTATE DEHYDROGENASE (CC13): LDH: 189 U/L (ref 125–245)

## 2014-03-05 LAB — CBC WITH DIFFERENTIAL/PLATELET
BASO%: 0.1 % (ref 0.0–2.0)
Basophils Absolute: 0 10*3/uL (ref 0.0–0.1)
EOS%: 0.2 % (ref 0.0–7.0)
Eosinophils Absolute: 0 10*3/uL (ref 0.0–0.5)
HCT: 40.1 % (ref 38.4–49.9)
HGB: 13.4 g/dL (ref 13.0–17.1)
LYMPH#: 0.7 10*3/uL — AB (ref 0.9–3.3)
LYMPH%: 5.5 % — ABNORMAL LOW (ref 14.0–49.0)
MCH: 33.2 pg (ref 27.2–33.4)
MCHC: 33.4 g/dL (ref 32.0–36.0)
MCV: 99.3 fL — ABNORMAL HIGH (ref 79.3–98.0)
MONO#: 0.9 10*3/uL (ref 0.1–0.9)
MONO%: 7.3 % (ref 0.0–14.0)
NEUT%: 86.9 % — ABNORMAL HIGH (ref 39.0–75.0)
NEUTROS ABS: 10.6 10*3/uL — AB (ref 1.5–6.5)
Platelets: 219 10*3/uL (ref 140–400)
RBC: 4.04 10*6/uL — AB (ref 4.20–5.82)
RDW: 14.6 % (ref 11.0–14.6)
WBC: 12.2 10*3/uL — ABNORMAL HIGH (ref 4.0–10.3)

## 2014-03-05 LAB — CEA: CEA: 12 ng/mL — ABNORMAL HIGH (ref 0.0–5.0)

## 2014-03-09 ENCOUNTER — Encounter: Payer: Self-pay | Admitting: Internal Medicine

## 2014-03-09 ENCOUNTER — Telehealth: Payer: Self-pay | Admitting: Internal Medicine

## 2014-03-09 ENCOUNTER — Ambulatory Visit (HOSPITAL_BASED_OUTPATIENT_CLINIC_OR_DEPARTMENT_OTHER): Payer: Medicare Other | Admitting: Internal Medicine

## 2014-03-09 ENCOUNTER — Ambulatory Visit: Payer: Medicare Other | Admitting: Nurse Practitioner

## 2014-03-09 VITALS — BP 96/47 | HR 87 | Temp 98.1°F | Resp 18 | Ht 67.0 in | Wt 120.1 lb

## 2014-03-09 DIAGNOSIS — C182 Malignant neoplasm of ascending colon: Secondary | ICD-10-CM

## 2014-03-09 DIAGNOSIS — C180 Malignant neoplasm of cecum: Secondary | ICD-10-CM

## 2014-03-09 DIAGNOSIS — C786 Secondary malignant neoplasm of retroperitoneum and peritoneum: Secondary | ICD-10-CM

## 2014-03-09 MED ORDER — PROCHLORPERAZINE MALEATE 10 MG PO TABS: 10.0000 mg | ORAL_TABLET | Freq: Four times a day (QID) | ORAL | Status: AC | PRN

## 2014-03-09 NOTE — Telephone Encounter (Signed)
Gave pt appt for lab and MD on May and October 2015

## 2014-03-09 NOTE — Progress Notes (Signed)
[place] [phone]   [phone]  OFFICE PROGRESS NOTE  Glo Herring., MD [address] [place] [address]  DIAGNOSIS:  #1 Stage IV colon cancer with diffuse peritoneal metastases at diagnosis in April 2012. #2. Coronary artery disease status post bypass surgery status post coronary stenting.  #3. Mitral and aortic valvular disease.  #4. Cerebrovascular disease status post right carotid endarterectomy.  #5. Peripheral vascular disease.  #6. Bilateral pleural effusions left greater than right. Likely Cardiac origin stable on current CT scan compared with previous study.Currently asymptomatic.  #7. Sensorineural hearing deficits   PRIOR THERAPY:  1) He underwent resection of the primary lesion in the cecum. 2) Treatment with Xeloda with excellent response. 3) He was treated with 37.5 gray in 15 fractions between April 14 and 03/27/2013 to the right lower quadrant progressive mass by Dr. Kyung Rudd.  CURRENT THERAPY: Xeloda 1000 mg by mouth twice a day for 14 days every 3 weeks. Expected to be started in the next few days.   INTERVAL HISTORY: Charles Faulkner 80 y.o. male returns to the clinic today for followup visit accompanied by his wife. He is a former patient Dr. Beryle Beams is here today to establish care with me after Dr. Beryle Beams left the practice. The patient is feeling fine today with no specific complaints except for increasing fatigue and weakness. He is still able to work around in his house and to the backyard. He was seen by Dr. Beryle Beams a few months ago. He has been observation further his metastatic colorectal cancer. The patient denied having any significant nausea or vomiting, no fever or chills. He denied having any significant chest pain but continues to have shortness of breath with exertion. He had repeat CT scan of the abdomen and pelvis performed recently and he is here for evaluation and  discussion of his scan results and recommendation regarding treatment of his condition.   MEDICAL HISTORY: Past Medical History  Diagnosis Date  . CAD (coronary artery disease)   . PAD (peripheral artery disease)   . Hyperlipemia   . Aortic stenosis, moderate   . A-fib     not candidate for coumadin  . AAA (abdominal aortic aneurysm)     followed by Dr. Gwenlyn Found  . Blood transfusion   . Heart murmur   . Hypertension   . Heart attack 1989, 2002  . Hearing loss of both ears 11/13/2011  . Mitral stenosis and aortic insufficiency 11/13/2011  . Aortic valve stenosis, moderate 11/13/2011  . Chronic atrial fibrillation 11/13/2011  . Benign essential HTN 11/13/2011  . S/P coronary artery stent placement 11/13/2011  . Peripheral vascular disease, asymptomatic 11/13/2011    occluded distal right SFA by PD angiogram  . IDA (iron deficiency anemia)     gi bleed on ASA/Plavix  . Allergy   . History of radiation therapy 03/09/2013-03/27/2013    37.5 gray to right pelvis  . Carotid artery disease   . CAS (cerebral atherosclerosis)   . Basal cell carcinoma of ear 11/13/2011  . Cancer     colon  . Colon cancer dx'd 02/2011    ALLERGIES:  is allergic to aspirin and metoprolol tartrate.  MEDICATIONS:  Current Outpatient Prescriptions  Medication Sig Dispense Refill  . atenolol (TENORMIN) 25 MG tablet TAKE 1 TABLET TWICE DAILY  60 tablet  6  . atorvastatin (LIPITOR) 40 MG tablet ONE TABLET EVERY EVENING  30 tablet  5  .  clopidogrel (PLAVIX) 75 MG tablet TAKE 1 TABLET (75 MG TOTAL) BY MOUTH DAILY.  30 tablet  6  . Digoxin 62.5 MCG TABS Take 0.0625 mg by mouth daily.  30 tablet  5  . furosemide (LASIX) 40 MG tablet Take 1 tablet (40 mg total) by mouth daily.  30 tablet  4  . lansoprazole (PREVACID) 15 MG capsule Take 15 mg by mouth daily.        Marland Kitchen losartan (COZAAR) 25 MG tablet Take 1 tablet (25 mg total) by mouth daily.  30 tablet  7  . Multiple Vitamins-Minerals ([employer] SPECTRAVITE SENIOR PO)  Take 1 tablet by mouth daily.        . niacin (NIASPAN) 500 MG CR tablet Take 3 tablets (1,500 mg total) by mouth at bedtime.  90 tablet  9  . nitroGLYCERIN (NITROSTAT) 0.4 MG SL tablet Place 1 tablet (0.4 mg total) under the tongue every 5 (five) minutes as needed for chest pain.  25 tablet  6  . polyethylene glycol powder (GLYCOLAX/MIRALAX) powder Take 17 g by mouth daily as needed (for constipation).       Marland Kitchen spironolactone (ALDACTONE) 25 MG tablet Take 0.5 tablets (12.5 mg total) by mouth daily.  30 tablet  5  . ZETIA 10 MG tablet TAKE 1 TABLET DAILY.  30 tablet  5   No current facility-administered medications for this visit.    SURGICAL HISTORY:  Past Surgical History  Procedure Laterality Date  . Sb capsule endoscopy  11/2004    ?nonbleeding leiomyomas in distal SB  . Esophagogastroduodenoscopy  01/2005    with enteroscopy, normal through proximal jejunum  . Colonoscopy  01/2005    with terminal ileoscopy, normal exam except internal hemorrhoids  . Skin cancer removal    . Coronary artery bypass graft  2002  . Carotid endarterectomy  2006    right  . Rca otium stent  2005  . Colon surgery    . Carotid doppler  09/10/2011    R CEA-normal patency, L ICA-50-69% diameter reduction  . Cardiac catheterization  08/17/1991    95% RCA focal stenosis reduced with a 2.84m balloon inflated 6 atm for 90 seconds resulting in reduction to ~30% w/o dissection  . Cardiac catheterization  03/29/1992    95% RCA focal stenosis reduced with a 3.070m4cm at 8 atm for 150 seconds, resulting in reduction to 30% w/o dissection  . Cardiac catheterization  03/11/2001    Severe three-vessel disease, recommended CABG  . Cardiovascular stress test  02/13/2010    Mild-moderate defect seen in Apical Septal, Apical Lateral, and Apical regions-consistant with infarct/scar, EKG negative for ischemia, mild defect seen in Basal Inferior region-consistent with infarct/scar  . 2d echocardiogram  04/11/2011    EF 40-45%,     . Pv angiogram  05/27/2002    65-70% stenosis R ICA proximally at the bulb, 50-55% stenosis of L ICA at the bulb    REVIEW OF SYSTEMS:  Constitutional: positive for fatigue and weight loss Eyes: negative Ears, nose, mouth, throat, and face: negative Respiratory: negative Cardiovascular: negative Gastrointestinal: negative Genitourinary:negative Integument/breast: negative Hematologic/lymphatic: negative Musculoskeletal:negative Neurological: negative Behavioral/Psych: negative Endocrine: negative Allergic/Immunologic: negative   PHYSICAL EXAMINATION: General appearance: alert, cooperative, fatigued and no distress Head: Normocephalic, without obvious abnormality, atraumatic Neck: no adenopathy, no JVD, supple, symmetrical, trachea midline and thyroid not enlarged, symmetric, no tenderness/mass/nodules Lymph nodes: Cervical, supraclavicular, and axillary nodes normal. Resp: clear to auscultation bilaterally Back: symmetric, no curvature. ROM normal. No CVA tenderness.  Cardio: regular rate and rhythm, S1, S2 normal, no murmur, click, rub or gallop GI: soft, non-tender; bowel sounds normal; no masses,  no organomegaly Extremities: extremities normal, atraumatic, no cyanosis or edema Neurologic: Alert and oriented X 3, normal strength and tone. Normal symmetric reflexes. Normal coordination and gait  ECOG PERFORMANCE STATUS: 2 - Symptomatic, <50% confined to bed  Blood pressure 96/47, pulse 87, temperature 98.1 F (36.7 C), temperature source Oral, resp. rate 18, height _0  (1.702 m), weight 120 lb 1.6 oz (54.477 kg), SpO2 100.00%.  LABORATORY DATA: Lab Results  Component Value Date   WBC 12.2* 03/05/2014   HGB 13.4 03/05/2014   HCT 40.1 03/05/2014   MCV 99.3* 03/05/2014   PLT 219 03/05/2014      Chemistry      Component Value Date/Time   NA 140 03/05/2014 0747   NA 142 11/30/2013 0828   NA 143 06/05/2012 0822   K 4.4 03/05/2014 0747   K 3.9 11/30/2013 0828   K 3.9 06/05/2012  0822   CL 102 11/30/2013 0828   CL 107 04/01/2013 1048   CL 100 06/05/2012 0822   CO2 28 03/05/2014 0747   CO2 28 11/30/2013 0828   CO2 29 06/05/2012 0822   BUN 17.7 03/05/2014 0747   BUN 22 11/30/2013 0828   BUN 15 06/05/2012 0822   CREATININE 0.9 03/05/2014 0747   CREATININE 0.92 11/30/2013 0828   CREATININE 0.78 08/18/2013 0432      Component Value Date/Time   CALCIUM 10.5* 03/05/2014 0747   CALCIUM 9.8 11/30/2013 0828   CALCIUM 9.8 06/05/2012 0822   ALKPHOS 126 03/05/2014 0747   ALKPHOS 122* 08/13/2013 1004   ALKPHOS 101* 06/05/2012 0822   AST 30 03/05/2014 0747   AST 52* 08/13/2013 1004   AST 41* 06/05/2012 0822   ALT 23 03/05/2014 0747   ALT 37 08/13/2013 1004   ALT 25 06/05/2012 0822   BILITOT 0.81 03/05/2014 0747   BILITOT 0.8 08/13/2013 1004   BILITOT 1.30 06/05/2012 0822       RADIOGRAPHIC STUDIES: Ct Abdomen Pelvis Wo Contrast  03/05/2014   CLINICAL DATA:  Carcinoma of the right colon with peritoneal carcinomatosis, post clot and small bowel resections, chemotherapy, pelvic radiation therapy  EXAM: CT ABDOMEN AND PELVIS WITHOUT CONTRAST  TECHNIQUE: Multidetector CT imaging of the abdomen and pelvis was performed following the standard protocol without intravenous contrast. Sagittal and coronal MPR images reconstructed from axial data set. Patient drank dilute oral contrast for exam.  COMPARISON:  None.  FINDINGS: Small left pleural effusion.  Nodular focus versus minimal atelectasis left lower lobe 7 x 4 mm image 9.  Potential nodules 5 mm right lower lobe image 9 and left lower lobe 3 mm diameter image 1.  Enlargement of cardiac chambers post median sternotomy.  Extensive atherosclerotic calcifications with aneurysmal dilatation of distal abdominal aorta 3.4 x 3.1 cm image 41.  Bilateral low-attenuation renal lesions likely cysts.  Spleen, pancreas and kidneys otherwise unremarkable.  Bilateral adrenal gland thickening without discrete nodule.  Mild scattered periportal edema the within liver.   Scattered areas of vague low attenuation within liver suspicious for metastases, suboptimally assessed in the absence of contrast ; these include posterior right lobe 2.6 x 2.1 cm image 18 12 x 11 mm lateral right lobe image 15, question lateral segment left lobe 3.2 x 2.3 cm image 16.  Rectal wall thickening with presacral edema could be related to colitis or prior radiation therapy.  Additional thickened bowel  loop in lateral right pelvis likely small bowel could represent radiation enteritis as well.  Ventral fascial defect/hernia versus fascial laxity containing nonobstructed bowel.  Enlarged mesenteric lymph node right lower quadrant 11 mm short axis image 56.  Abnormal soft tissue at right external iliac chain appears slightly more prominent on the previous study with an additional 12 mm short axis enlarged lymph node image 59.  Small amount of nonspecific free pelvic fluid.  Probable left inguinal hernia containing fat.  Unremarkable bladder in ureters with mild prostatic enlargement.  Mildly enlarged left periaortic nodes, example 11 mm image 35.  No free air, bowel obstruction, or acute osseous findings.  Chronic degenerative disc disease changes of the thoracolumbar spine and chronic right hip dislocation with pseudoarthrosis again seen.  IMPRESSION: Abnormal soft tissue density at the right external iliac region is slightly more prominent and is not associated with adjacent adenopathy.  New bowel wall edema involving a small bowel loop in right pelvis as well as the rectum, could reflect enteritis or prior radiation therapy.  Increased pelvic ascites.  Suspected new hepatic metastatic lesions though these are suboptimally visualized due to lack of IV contrast; if correlation is required consider MR evaluation.  Abdominal aortic aneurysm 3.4 x 3.1 cm.  Left paraaortic adenopathy.  Questionable lung base nodules, exclude metastatic foci.   Electronically Signed   By: Lavonia Dana M.D.   On: 03/05/2014 08:55      ASSESSMENT AND PLAN: This is a very pleasant 80 years old white male with history of stage IV colon adenocarcinoma with diffuse peritoneal metastases status post surgical resection of the primary lesion in the cecum in addition to 2 years treatment with Xeloda was good response but this was discontinued one year ago. The patient also received palliative radiotherapy to the progressive lesion in the right lower quadrant completed in May of 2014.  He has been observation since that time by the recent scan showed evidence for disease progression with increase in the soft tissue density at the right external iliac region in addition to questionable liver lesions, in addition to elevation of the CEA. I have a lengthy discussion with the patient and his wife today about his current disease status and treatment options. His performance status is very poor and I do think the patient can tolerate any aggressive therapy. I gave him the option of treatment with Xeloda versus palliative care and hospice referral. The patient and his wife are interested in resuming treatment with Xeloda since he had good response to this treatment in the past. I discussed the adverse effect of the treatment with the patient and his wife. I will start him on Xeloda 1000 mg by mouth twice a day for 14 days every 3 weeks. Will send this prescription to [facility]. The patient would come back for followup visit in 3 weeks for reevaluation and management any adverse effect of his treatment. He was advised to call immediately if he has any concerning symptoms in the interval. The patient voices understanding of current disease status and treatment options and is in agreement with the current care plan.  All questions were answered. The patient knows to call the clinic with any problems, questions or concerns. We can certainly see the patient much sooner if necessary.  I spent 20 minutes counseling the patient face to face. The total  time spent in the appointment was 30 minutes.  Disclaimer: This note was dictated with voice recognition software. Similar sounding words can inadvertently be  transcribed and may not be corrected upon review.

## 2014-03-14 ENCOUNTER — Encounter (HOSPITAL_COMMUNITY): Payer: Self-pay | Admitting: Emergency Medicine

## 2014-03-14 ENCOUNTER — Emergency Department (HOSPITAL_COMMUNITY): Payer: Medicare Other

## 2014-03-14 ENCOUNTER — Emergency Department (HOSPITAL_COMMUNITY)
Admission: EM | Admit: 2014-03-14 | Discharge: 2014-03-14 | Disposition: A | Discharge status: Home / Self Care | Payer: Medicare Other | Attending: Emergency Medicine | Admitting: Emergency Medicine

## 2014-03-14 DIAGNOSIS — Z85038 Personal history of other malignant neoplasm of large intestine: Secondary | ICD-10-CM | POA: Insufficient documentation

## 2014-03-14 DIAGNOSIS — I052 Rheumatic mitral stenosis with insufficiency: Secondary | ICD-10-CM | POA: Insufficient documentation

## 2014-03-14 DIAGNOSIS — H9190 Unspecified hearing loss, unspecified ear: Secondary | ICD-10-CM | POA: Insufficient documentation

## 2014-03-14 DIAGNOSIS — Z951 Presence of aortocoronary bypass graft: Secondary | ICD-10-CM | POA: Insufficient documentation

## 2014-03-14 DIAGNOSIS — I359 Nonrheumatic aortic valve disorder, unspecified: Secondary | ICD-10-CM | POA: Insufficient documentation

## 2014-03-14 DIAGNOSIS — Z862 Personal history of diseases of the blood and blood-forming organs and certain disorders involving the immune mechanism: Secondary | ICD-10-CM | POA: Insufficient documentation

## 2014-03-14 DIAGNOSIS — I959 Hypotension, unspecified: Secondary | ICD-10-CM | POA: Insufficient documentation

## 2014-03-14 DIAGNOSIS — C787 Secondary malignant neoplasm of liver and intrahepatic bile duct: Secondary | ICD-10-CM | POA: Insufficient documentation

## 2014-03-14 DIAGNOSIS — Z923 Personal history of irradiation: Secondary | ICD-10-CM | POA: Insufficient documentation

## 2014-03-14 DIAGNOSIS — E785 Hyperlipidemia, unspecified: Secondary | ICD-10-CM | POA: Insufficient documentation

## 2014-03-14 DIAGNOSIS — I2510 Atherosclerotic heart disease of native coronary artery without angina pectoris: Secondary | ICD-10-CM | POA: Insufficient documentation

## 2014-03-14 DIAGNOSIS — R630 Anorexia: Secondary | ICD-10-CM | POA: Insufficient documentation

## 2014-03-14 DIAGNOSIS — R059 Cough, unspecified: Secondary | ICD-10-CM | POA: Insufficient documentation

## 2014-03-14 DIAGNOSIS — M25559 Pain in unspecified hip: Secondary | ICD-10-CM

## 2014-03-14 DIAGNOSIS — Z7902 Long term (current) use of antithrombotics/antiplatelets: Secondary | ICD-10-CM | POA: Insufficient documentation

## 2014-03-14 DIAGNOSIS — Z9861 Coronary angioplasty status: Secondary | ICD-10-CM | POA: Insufficient documentation

## 2014-03-14 DIAGNOSIS — R05 Cough: Secondary | ICD-10-CM | POA: Insufficient documentation

## 2014-03-14 DIAGNOSIS — I4891 Unspecified atrial fibrillation: Secondary | ICD-10-CM | POA: Insufficient documentation

## 2014-03-14 DIAGNOSIS — I509 Heart failure, unspecified: Secondary | ICD-10-CM | POA: Insufficient documentation

## 2014-03-14 DIAGNOSIS — R011 Cardiac murmur, unspecified: Secondary | ICD-10-CM | POA: Insufficient documentation

## 2014-03-14 DIAGNOSIS — G8929 Other chronic pain: Secondary | ICD-10-CM | POA: Insufficient documentation

## 2014-03-14 DIAGNOSIS — R5383 Other fatigue: Principal | ICD-10-CM

## 2014-03-14 DIAGNOSIS — I714 Abdominal aortic aneurysm, without rupture, unspecified: Secondary | ICD-10-CM | POA: Insufficient documentation

## 2014-03-14 DIAGNOSIS — R5381 Other malaise: Secondary | ICD-10-CM | POA: Insufficient documentation

## 2014-03-14 DIAGNOSIS — R531 Weakness: Secondary | ICD-10-CM

## 2014-03-14 DIAGNOSIS — C799 Secondary malignant neoplasm of unspecified site: Secondary | ICD-10-CM

## 2014-03-14 DIAGNOSIS — I10 Essential (primary) hypertension: Secondary | ICD-10-CM | POA: Insufficient documentation

## 2014-03-14 DIAGNOSIS — Z9221 Personal history of antineoplastic chemotherapy: Secondary | ICD-10-CM | POA: Insufficient documentation

## 2014-03-14 DIAGNOSIS — Q749 Unspecified congenital malformation of limb(s): Secondary | ICD-10-CM | POA: Insufficient documentation

## 2014-03-14 DIAGNOSIS — Z79899 Other long term (current) drug therapy: Secondary | ICD-10-CM | POA: Insufficient documentation

## 2014-03-14 DIAGNOSIS — Z9889 Other specified postprocedural states: Secondary | ICD-10-CM | POA: Insufficient documentation

## 2014-03-14 DIAGNOSIS — Z87891 Personal history of nicotine dependence: Secondary | ICD-10-CM | POA: Insufficient documentation

## 2014-03-14 DIAGNOSIS — Z85828 Personal history of other malignant neoplasm of skin: Secondary | ICD-10-CM | POA: Insufficient documentation

## 2014-03-14 DIAGNOSIS — C786 Secondary malignant neoplasm of retroperitoneum and peritoneum: Secondary | ICD-10-CM | POA: Insufficient documentation

## 2014-03-14 HISTORY — DX: Heart failure, unspecified: I50.9

## 2014-03-14 LAB — COMPREHENSIVE METABOLIC PANEL
ALBUMIN: 2.3 g/dL — AB (ref 3.5–5.2)
ALT: 23 U/L (ref 0–53)
AST: 30 U/L (ref 0–37)
Alkaline Phosphatase: 129 U/L — ABNORMAL HIGH (ref 39–117)
BUN: 23 mg/dL (ref 6–23)
CO2: 26 mEq/L (ref 19–32)
Calcium: 9.8 mg/dL (ref 8.4–10.5)
Chloride: 101 mEq/L (ref 96–112)
Creatinine, Ser: 0.92 mg/dL (ref 0.50–1.35)
GFR calc Af Amer: 87 mL/min — ABNORMAL LOW (ref >90)
GFR calc non Af Amer: 75 mL/min — ABNORMAL LOW (ref >90)
Glucose, Bld: 158 mg/dL — ABNORMAL HIGH (ref 70–99)
POTASSIUM: 3.8 meq/L (ref 3.7–5.3)
Sodium: 139 mEq/L (ref 137–147)
Total Bilirubin: 0.9 mg/dL (ref 0.3–1.2)
Total Protein: 6.4 g/dL (ref 6.0–8.3)

## 2014-03-14 LAB — URINALYSIS, ROUTINE W REFLEX MICROSCOPIC
BILIRUBIN URINE: NEGATIVE
GLUCOSE, UA: NEGATIVE mg/dL
Hgb urine dipstick: NEGATIVE
Leukocytes, UA: NEGATIVE
Nitrite: NEGATIVE
PROTEIN: NEGATIVE mg/dL
Specific Gravity, Urine: 1.015 (ref 1.005–1.030)
Urobilinogen, UA: 1 mg/dL (ref 0.0–1.0)
pH: 6 (ref 5.0–8.0)

## 2014-03-14 LAB — CBC WITH DIFFERENTIAL/PLATELET
BASOS ABS: 0 10*3/uL (ref 0.0–0.1)
Basophils Relative: 0 % (ref 0–1)
Eosinophils Absolute: 0 10*3/uL (ref 0.0–0.7)
Eosinophils Relative: 0 % (ref 0–5)
HEMATOCRIT: 35.6 % — AB (ref 39.0–52.0)
Hemoglobin: 12.2 g/dL — ABNORMAL LOW (ref 13.0–17.0)
LYMPHS PCT: 5 % — AB (ref 12–46)
Lymphs Abs: 0.9 10*3/uL (ref 0.7–4.0)
MCH: 33.3 pg (ref 26.0–34.0)
MCHC: 34.3 g/dL (ref 30.0–36.0)
MCV: 97.3 fL (ref 78.0–100.0)
MONOS PCT: 6 % (ref 3–12)
Monocytes Absolute: 1.1 10*3/uL — ABNORMAL HIGH (ref 0.1–1.0)
NEUTROS ABS: 16.3 10*3/uL — AB (ref 1.7–7.7)
Neutrophils Relative %: 89 % — ABNORMAL HIGH (ref 43–77)
Platelets: 214 10*3/uL (ref 150–400)
RBC: 3.66 MIL/uL — AB (ref 4.22–5.81)
RDW: 14.6 % (ref 11.5–15.5)
WBC: 18.3 10*3/uL — AB (ref 4.0–10.5)

## 2014-03-14 LAB — LIPASE, BLOOD: Lipase: 12 U/L (ref 11–59)

## 2014-03-14 MED ORDER — SODIUM CHLORIDE 0.9 % IV SOLN
INTRAVENOUS | Status: DC
  Administered 2014-03-14 (×2): via INTRAVENOUS

## 2014-03-14 MED ORDER — SODIUM CHLORIDE 0.9 % IV BOLUS (SEPSIS)
500.0000 mL | Freq: Once | INTRAVENOUS | Status: AC
  Administered 2014-03-14: 500 mL via INTRAVENOUS

## 2014-03-14 MED ORDER — ACETAMINOPHEN 325 MG PO TABS
650.0000 mg | ORAL_TABLET | Freq: Once | ORAL | Status: AC
  Administered 2014-03-14: 650 mg via ORAL
  Filled 2014-03-14: qty 2

## 2014-03-14 NOTE — ED Notes (Signed)
Lives at home with his wife and has terminal cancer of colon.  Per wife she can not care for him anymore at home.

## 2014-03-14 NOTE — Discharge Instructions (Signed)
Get plenty of rest, drink plenty of fluids and eat 3 meals each day. Contact [facility], to get his Xeloda prescription. Followup with his oncologist, as needed for problems.     Arthralgia Your caregiver has diagnosed you as suffering from an arthralgia. Arthralgia means there is pain in a joint. This can come from many reasons including:  Bruising the joint which causes soreness (inflammation) in the joint.  Wear and tear on the joints which occur as we grow older (osteoarthritis).  Overusing the joint.  Various forms of arthritis.  Infections of the joint. Regardless of the cause of pain in your joint, most of these different pains respond to anti-inflammatory drugs and rest. The exception to this is when a joint is infected, and these cases are treated with antibiotics, if it is a bacterial infection. HOME CARE INSTRUCTIONS   Rest the injured area for as long as directed by your caregiver. Then slowly start using the joint as directed by your caregiver and as the pain allows. Crutches as directed may be useful if the ankles, knees or hips are involved. If the knee was splinted or casted, continue use and care as directed. If an stretchy or elastic wrapping bandage has been applied today, it should be removed and re-applied every 3 to 4 hours. It should not be applied tightly, but firmly enough to keep swelling down. Watch toes and feet for swelling, bluish discoloration, coldness, numbness or excessive pain. If any of these problems (symptoms) occur, remove the ace bandage and re-apply more loosely. If these symptoms persist, contact your caregiver or return to this location.  For the first 24 hours, keep the injured extremity elevated on pillows while lying down.  Apply ice for 15-20 minutes to the sore joint every couple hours while awake for the first half day. Then 03-04 times per day for the first 48 hours. Put the ice in a plastic bag and place a towel between the bag of ice and  your skin.  Wear any splinting, casting, elastic bandage applications, or slings as instructed.  Only take over-the-counter or prescription medicines for pain, discomfort, or fever as directed by your caregiver. Do not use aspirin immediately after the injury unless instructed by your physician. Aspirin can cause increased bleeding and bruising of the tissues.  If you were given crutches, continue to use them as instructed and do not resume weight bearing on the sore joint until instructed. Persistent pain and inability to use the sore joint as directed for more than 2 to 3 days are warning signs indicating that you should see a caregiver for a follow-up visit as soon as possible. Initially, a hairline fracture (break in bone) may not be evident on X-rays. Persistent pain and swelling indicate that further evaluation, non-weight bearing or use of the joint (use of crutches or slings as instructed), or further X-rays are indicated. X-rays may sometimes not show a small fracture until a week or 10 days later. Make a follow-up appointment with your own caregiver or one to whom we have referred you. A radiologist (specialist in reading X-rays) may read your X-rays. Make sure you know how you are to obtain your X-ray results. Do not assume everything is normal if you do not hear from [place]. SEEK MEDICAL CARE IF: Bruising, swelling, or pain increases. SEEK IMMEDIATE MEDICAL CARE IF:   Your fingers or toes are numb or blue.  The pain is not responding to medications and continues to stay the same or get  worse.  The pain in your joint becomes severe.  You develop a fever over 102 F (38.9 C).  It becomes impossible to move or use the joint. MAKE SURE YOU:   Understand these instructions.  Will watch your condition.  Will get help right away if you are not doing well or get worse. Document Released: 11/12/2005 Document Revised: 02/04/2012 Document Reviewed: 06/30/2008 [facility] Patient Information  2014 [place].  Metastatic Cancer, Questions and Answers KEY POINTS  Cancer happens when cells become abnormal and grow without control.  Where the cancer started is called the primary cancer or the primary tumor.  Metastatic cancer happens when cancer cells spread from the place where it started to other parts of the body.  When cancer spreads, the metastatic cancer keeps the same type of cells and the same name as the primary tumor.  The most common sites of metastasis are the lungs, bones, liver, and brain.  Treatment for metastatic cancer usually depends on the type of cancer. It also depends on the size and location of the metastasis. WHAT IS CANCER?   Cancer is a group of many related diseases. All cancers begin in cells. Cells are the building blocks that make up tissues. Cancer that arises from organs and solid tissues is called a solid tumor. Cancer that begins in blood cells is called leukemia, multiple myeloma, or lymphoma.  Normally, cells grow and divide to form new cells as the body needs them. When cells grow old and die, new cells take their place. Sometimes this orderly process goes wrong. New cells form when the body does not need them. Old cells do not die when they should.  The extra cells form a mass of tissue. This is called a growth or tumor. Tumors can be either not cancerous (benign) or cancerous (malignant). Benign tumors do not spread to other parts of the body. They are rarely a threat to life. Malignant tumors can spread (metastasize) and may be life threatening. WHAT IS PRIMARY CANCER?  Cancer can begin in any organ or tissue of the body. The original tumor is called the primary cancer or primary tumor. It is usually named for the part of the body or the type of cell in which it begins. WHAT IS METASTASIS, AND HOW DOES IT HAPPEN?   Metastasis means the spread of cancer. Cancer cells can break away from a primary tumor and enter the bloodstream or  lymphatic system. This is the system that produces, stores, and carries the cells that fight infections. That is how cancer cells spread to other parts of the body.  When cancer cells spread and form a new tumor in a different organ, the new tumor is a metastatic tumor. The cells in the metastatic tumor come from the original tumor. For example, if breast cancer spreads to the lungs, the metastatic tumor in the lung is made up of cancerous breast cells. It is not made of lung cells. In this case, the disease in the lungs is metastatic breast cancer (not lung cancer). Under a microscope, metastatic breast cancer cells generally look the same as the cancer cells in the breast. [place]?   Cancer cells can spread to almost any part of the body. Cancer cells frequently spread to lymph nodes (rounded masses of lymphatic tissue) near the primary tumor (regional lymph nodes). This is called lymph node involvement or regional disease. Cancer that spreads to other organs or to lymph nodes far from the primary tumor  is called metastatic disease. Caregivers sometimes also call this distant disease.  The most common sites of metastasis from solid tumors are the lungs, bones, liver, and brain. Some cancers tend to spread to certain parts of the body. For example, lung cancer often metastasizes to the brain or bones. Colon cancer often spreads to the liver. Prostate cancer tends to spread to the bones. Breast cancer commonly spreads to the bones, lungs, liver, or brain. But each of these cancers can spread to other parts of the body as well.  Because blood cells travel throughout the body, leukemia, multiple myeloma, and lymphoma cells are usually not localized when the cancer is diagnosed. Tumor cells may be found in the blood, several lymph nodes, or other parts of the body such as the liver or bones. This type of spread is not referred to as metastasis. ARE THERE SYMPTOMS OF METASTATIC CANCER?    Some people with metastatic cancer do not have symptoms. Their metastases are found by X-rays and other tests performed for other reasons.  When symptoms of metastatic cancer occur, the type and frequency of the symptoms will depend on the size and location of the metastasis. For example, cancer that spreads to the bones is likely to cause pain and can lead to bone fractures. Cancer that spreads to the brain can cause a variety of symptoms. These include headaches, seizures, and unsteadiness. Shortness of breath may be a sign of lung involvement. Abdominal swelling or yellowing of the skin (jaundice) can indicate that cancer has spread to the liver.  Sometimes a person's primary cancer is discovered only after the metastatic tumor causes symptoms. For example, a man whose prostate cancer has spread to the bones in his pelvis may have lower back pain (caused by the cancer in his bones) before he experiences any symptoms from the primary tumor in his prostate. HOW DOES THE CAREGIVER KNOW WHETHER A CANCER IS PRIMARY OR A METASTATIC TUMOR?  To determine whether a tumor is primary or metastatic, the tumor will be examined under a microscope. In general, cancer cells look like abnormal versions of cells in the tissue where the cancer began. Using specialized diagnostic tests, a trained person is often able to tell where the cancer cells came from. Markers or antigens found in or on the cancer cells can indicate the primary site of the cancer.  Metastatic cancers may be found before or at the same time as the primary tumor, or months or years later. When a new tumor is found in a patient who has been treated for cancer in the past, it is more often a metastasis than another primary tumor. IS IT POSSIBLE TO HAVE A METASTATIC TUMOR WITHOUT HAVING A PRIMARY CANCER?  No. A metastatic tumor always starts from cancer cells in another part of the body. In most cases, when a metastatic tumor is found first, the  primary tumor can be found. The search for the primary tumor may involve lab tests, X-rays, and other procedures. However, in a small number of cases, a metastatic tumor is diagnosed but the primary tumor cannot be found, in spite of extensive tests. The tumor is metastatic because the cells are not like those in the organ or tissue in which the tumor is found. The primary tumor is called unknown or hidden (occult). The patient is said to have cancer of unknown primary origin (CUP). Because diagnostic techniques are constantly improving, the number of cases of CUP is going down.  WHAT TREATMENTS ARE  USED FOR METASTATIC CANCER?   When cancer has metastasized, it may be treated with:  Chemotherapy.  Radiation therapy.  Biological therapy.  Hormone therapy.  Surgery.  Cryosurgery.  A combination of these.  The choice of treatment generally depends on the:  Type of primary cancer.  Size and location of the metastasis.  Patient's age and general health.  Types of treatments the patient has had in the past. In patients with CUP, it is possible to treat the disease even though the primary tumor has not been located. The goal of treatment may be to control the cancer, or to relieve symptoms or side effects of treatment. ARE NEW TREATMENTS FOR METASTATIC CANCER BEING DEVELOPED?  Yes, many new cancer treatments are under study. To develop new treatments, the [place] sponsors clinical trials (research studies) with cancer patients in many hospitals, universities, medical schools, and cancer centers around the country. Clinical trials are a critical step in the improvement of treatment. Before any new treatment can be recommended for general use, doctors conduct studies to find out whether the treatment is both safe for patients and effective against the disease. The results of such studies have led to progress not only in the treatment of cancer, but in the detection, diagnosis, and prevention of the  disease as well. Patients interested in taking part in a clinical trial should talk with their caregivers. [place] ([place]): www.cancer.gov Document Released: 03/19/2005 Document Revised: 02/04/2012 Document Reviewed: 11/04/2008 [facility] Patient Information 2014 [place], [place].

## 2014-03-14 NOTE — ED Notes (Signed)
MD aware of BP, ok to discharge home  Patient with no complaints at this time. Respirations even and unlabored. Skin warm/dry. Discharge instructions reviewed with patient at this time. Patient given opportunity to voice concerns/ask questions. IV removed per policy and band-aid applied to site. Patient discharged at this time and left Emergency Department via wheelchair.

## 2014-03-14 NOTE — ED Provider Notes (Signed)
CSN: [id number]     Arrival date & time 03/14/14  7116 History  This chart was scribed for Richarda Blade, MD by Roxan Diesel, ED scribe.  This patient was seen in room APA06/APA06 and the patient's care was started at 8:39 AM.   Chief Complaint  Patient presents with  . Weakness    The history is provided by the patient. No language interpreter was used.    HPI Comments: Charles Faulkner is a 80 y.o. male with h/o colon cancer, CHF, CAD, PVD,  A-fib, HTN and hyperlipidemia brought in by EMS to the Emergency Department for several days of progressively-worsening generalized weakness with associated anorexia.  Wife states that at baseline pt typically walks with a walker.  However over the past 2 days he has had worsening weakness to the point that today he has been unable to ambulate at all.  Wife states he has been "dead weight" and had to be lifted by EMS.  He has also not been eating for the past 2 days.  He has had a non-productive cough as well.  Wife denies vomiting or bowel or bladder issues.  Pt has also been complaining of right hip pain; however wife states this is chronic and causes him to walk with a limp at baseline due to a congenital hip problem.  Wife states pt has received chemotherapy (Xeloda) at [facility] for over 2 years but he has since been taken off of this.  He recently acquired a new oncologist who told wife his cancer appears to be worsening, with metastases to liver and peritineum.  Per record review, oncologist advised going back on Xeloda.  However wife states she was not informed of this and he has not started back on treatment at this time.  Wife notes he was hospitalized for CHF in September 2014 and wife was advised to watch his weight.  He weighed 115 yesterday, down from 119 during that hospitalization.    He has a DNR.  PCP is Dr. Gerarda Fraction Oncologist is Dr. Julien Nordmann GI is Dr. Gala Romney    Past Medical History  Diagnosis Date  . CAD (coronary artery disease)    . PAD (peripheral artery disease)   . Hyperlipemia   . Aortic stenosis, moderate   . A-fib     not candidate for coumadin  . AAA (abdominal aortic aneurysm)     followed by Dr. Gwenlyn Found  . Blood transfusion   . Heart murmur   . Hypertension   . Heart attack 1989, 2002  . Hearing loss of both ears 11/13/2011  . Mitral stenosis and aortic insufficiency 11/13/2011  . Aortic valve stenosis, moderate 11/13/2011  . Chronic atrial fibrillation 11/13/2011  . Benign essential HTN 11/13/2011  . S/P coronary artery stent placement 11/13/2011  . Peripheral vascular disease, asymptomatic 11/13/2011    occluded distal right SFA by PD angiogram  . IDA (iron deficiency anemia)     gi bleed on ASA/Plavix  . Allergy   . History of radiation therapy 03/09/2013-03/27/2013    37.5 gray to right pelvis  . Carotid artery disease   . CAS (cerebral atherosclerosis)   . Basal cell carcinoma of ear 11/13/2011  . Cancer     colon  . Colon cancer dx'd 02/2011  . CHF (congestive heart failure)     Past Surgical History  Procedure Laterality Date  . Sb capsule endoscopy  11/2004    ?nonbleeding leiomyomas in distal SB  . Esophagogastroduodenoscopy  01/2005  with enteroscopy, normal through proximal jejunum  . Colonoscopy  01/2005    with terminal ileoscopy, normal exam except internal hemorrhoids  . Skin cancer removal    . Coronary artery bypass graft  2002  . Carotid endarterectomy  2006    right  . Rca otium stent  2005  . Colon surgery    . Carotid doppler  09/10/2011    R CEA-normal patency, L ICA-50-69% diameter reduction  . Cardiac catheterization  08/17/1991    95% RCA focal stenosis reduced with a 2.45m balloon inflated 6 atm for 90 seconds resulting in reduction to ~30% w/o dissection  . Cardiac catheterization  03/29/1992    95% RCA focal stenosis reduced with a 3.059m4cm at 8 atm for 150 seconds, resulting in reduction to 30% w/o dissection  . Cardiac catheterization  03/11/2001    Severe  three-vessel disease, recommended CABG  . Cardiovascular stress test  02/13/2010    Mild-moderate defect seen in Apical Septal, Apical Lateral, and Apical regions-consistant with infarct/scar, EKG negative for ischemia, mild defect seen in Basal Inferior region-consistent with infarct/scar  . 2d echocardiogram  04/11/2011    EF 40-45%,   . Pv angiogram  05/27/2002    65-70% stenosis R ICA proximally at the bulb, 50-55% stenosis of L ICA at the bulb    Family History  Problem Relation Age of Onset  . Colon cancer Neg Hx   . Liver disease Neg Hx   . Inflammatory bowel disease Neg Hx   . Heart disease Mother   . Heart disease Father   . Cancer Sister     colon    History  Substance Use Topics  . Smoking status: Former Smoker    Types: Cigarettes    Quit date: 11/18/1991  . Smokeless tobacco: Never Used     Comment: quit remotely  . Alcohol Use: No     Review of Systems  Constitutional: Positive for appetite change.  Respiratory: Positive for cough.   Gastrointestinal: Negative for vomiting, diarrhea and constipation.  Genitourinary: Negative for decreased urine volume.  Musculoskeletal: Positive for arthralgias (right hip).  Neurological: Positive for weakness.  All other systems reviewed and are negative.     Allergies  Aspirin and Metoprolol tartrate  Home Medications   Prior to Admission medications   Medication Sig Start Date End Date Taking? Authorizing Provider  atenolol (TENORMIN) 25 MG tablet TAKE 1 TABLET TWICE DAILY 01/08/14   Mihai Croitoru, MD  atorvastatin (LIPITOR) 40 MG tablet ONE TABLET EVERY EVENING 10/17/13   JoLorretta HarpMD  clopidogrel (PLAVIX) 75 MG tablet TAKE 1 TABLET (75 MG TOTAL) BY MOUTH DAILY. 01/25/14   JoLorretta HarpMD  Digoxin 62.5 MCG TABS Take 0.0625 mg by mouth daily. 10/19/13   JoLorretta HarpMD  furosemide (LASIX) 40 MG tablet Take 1 tablet (40 mg total) by mouth daily. 02/01/14   JoLorretta HarpMD  lansoprazole (PREVACID)  15 MG capsule Take 15 mg by mouth daily.      Historical Provider, MD  losartan (COZAAR) 25 MG tablet Take 1 tablet (25 mg total) by mouth daily. 02/08/14   JoLorretta HarpMD  Multiple Vitamins-Minerals ([employer] SPECTRAVITE SENIOR PO) Take 1 tablet by mouth daily.      Historical Provider, MD  niacin (NIASPAN) 500 MG CR tablet Take 3 tablets (1,500 mg total) by mouth at bedtime. 09/07/13   JoLorretta HarpMD  nitroGLYCERIN (NITROSTAT) 0.4 MG SL tablet Place 1 tablet (  0.4 mg total) under the tongue every 5 (five) minutes as needed for chest pain. 08/05/13   Lorretta Harp, MD  polyethylene glycol powder (GLYCOLAX/MIRALAX) powder Take 17 g by mouth daily as needed (for constipation).  06/22/11   Historical Provider, MD  prochlorperazine (COMPAZINE) 10 MG tablet Take 1 tablet (10 mg total) by mouth every 6 (six) hours as needed for nausea or vomiting. 03/09/14   Curt Bears, MD  spironolactone (ALDACTONE) 25 MG tablet Take 0.5 tablets (12.5 mg total) by mouth daily. 08/18/13   Tarri Fuller, PA-C  ZETIA 10 MG tablet TAKE 1 TABLET DAILY. 10/17/13   Lorretta Harp, MD   BP 90/51  Pulse 107  Temp(Src) 99 F (37.2 C) (Oral)  Resp 19  SpO2 96%  Physical Exam  Nursing note and vitals reviewed. Constitutional: He is oriented to person, place, and time. He appears well-developed and well-nourished.  HENT:  Head: Normocephalic and atraumatic.  Right Ear: External ear normal.  Left Ear: External ear normal.  Mucous membranes somewhat dry  Eyes: Conjunctivae and EOM are normal. Pupils are equal, round, and reactive to light.  Right eye has ectropion of lower lid Neck supple  Neck: Normal range of motion and phonation normal. Neck supple.  Cardiovascular: Normal rate, regular rhythm, normal heart sounds and intact distal pulses.   Hypotensive  Pulmonary/Chest: Effort normal and breath sounds normal. No respiratory distress. He has no decreased breath sounds. He has no wheezes. He has no rhonchi. He  has no rales. He exhibits no bony tenderness.  Abdominal: Soft. He exhibits no mass. There is no tenderness.  Abdomen protuberant  Musculoskeletal:  Right hip tender, right leg shortened, pain with ROM of right hip.  Neurological: He is alert and oriented to person, place, and time. No cranial nerve deficit or sensory deficit. He exhibits normal muscle tone. Coordination normal.  Skin: Skin is warm, dry and intact.  Psychiatric: He has a normal mood and affect. His behavior is normal.    ED Course  Procedures (including critical care time)  Patient Vitals for the past 24 hrs:  BP Temp Temp src Pulse Resp SpO2  03/14/14 1231 95/44 mmHg - - 99 - 95 %  03/14/14 0930 94/44 mmHg - - 98 - 94 %  03/14/14 0916 98/43 mmHg - - 25 - 80 %  03/14/14 0900 95/41 mmHg - - 102 - 94 %  03/14/14 0835 90/51 mmHg 99 F (37.2 C) Oral 107 19 96 %  03/14/14 0833 90/51 mmHg - - - - -    Medications  0.9 %  sodium chloride infusion ( Intravenous New Bag/Given 03/14/14 1124)  sodium chloride 0.9 % bolus 500 mL (0 mLs Intravenous Stopped 03/14/14 0935)  acetaminophen (TYLENOL) tablet 650 mg (650 mg Oral Given 03/14/14 1146)     DIAGNOSTIC STUDIES: Oxygen Saturation is 96% on room air, normal by my interpretation.    COORDINATION OF CARE: 8:45 AM-Discussed treatment plan which includes IV fluids, CXR, right hip x-ray, labs, and EKG with pt's wife at bedside and she agreed to plan.   11:19 AM-Pt has been telling wife he is hungry.  Will offer food and discharge home if he can eat.  Discussed with wife who agreed to plan.  1:00 PM Reevaluation with update and discussion. After initial assessment and treatment, an updated evaluation reveals he's eating well. He is comfortable. Findings discussed with patient and wife, all questions answered.Richarda Blade    Labs Review Labs Reviewed  CBC WITH DIFFERENTIAL - Abnormal; Notable for the following:    WBC 18.3 (*)    RBC 3.66 (*)    Hemoglobin 12.2 (*)     HCT 35.6 (*)    Neutrophils Relative % 89 (*)    Lymphocytes Relative 5 (*)    Neutro Abs 16.3 (*)    Monocytes Absolute 1.1 (*)    All other components within normal limits  COMPREHENSIVE METABOLIC PANEL - Abnormal; Notable for the following:    Glucose, Bld 158 (*)    Albumin 2.3 (*)    Alkaline Phosphatase 129 (*)    GFR calc non Af Amer 75 (*)    GFR calc Af Amer 87 (*)    All other components within normal limits  URINALYSIS, ROUTINE W REFLEX MICROSCOPIC - Abnormal; Notable for the following:    Ketones, ur TRACE (*)    All other components within normal limits  URINE CULTURE  LIPASE, BLOOD    Imaging Review Dg Chest 1 View  03/14/2014   CLINICAL DATA:  Fall with right hip pain.  History of colon cancer.  EXAM: CHEST - 1 VIEW  COMPARISON:  08/16/2013  FINDINGS: Prior median sternotomy. Convex left thoracolumbar spine curvature. Midline trachea. Moderate cardiomegaly. Decreased to resolved left-sided pleural effusion. Cannot exclude trace left pleural fluid or thickening. Apices minimally excluded. No pneumothorax. Mild interstitial edema. Somewhat more focal left suprahilar density could represent osseous summation shadow. Skin fold over the inferior left hemi thorax.  IMPRESSION: Cardiomegaly with mild interstitial edema.  Probable summation shadow in the left suprahilar region. Recommend nonemergent followup PA and lateral radiographs with attention to this area.  Possible trace left pleural fluid or thickening.   Electronically Signed   By: Abigail Miyamoto M.D.   On: 03/14/2014 10:25   Dg Hip Complete Right  03/14/2014   CLINICAL DATA:  History of fall complaining of right-sided hip pain. History of colon cancer.  EXAM: RIGHT HIP - COMPLETE 2+ VIEW  COMPARISON:  CT of the abdomen and pelvis 03/05/2014.  FINDINGS: Multiple views of the pelvis and right hip again demonstrate chronic right-sided hip dislocation with cephalad migration of the femur and pseudoarthrosis between the  misshapen right femoral head/neck, and the right ilium. This appears similar to the recent CT examination. No definite acute displaced fracture is identified. Multiple surgical clips are noted in the right hemipelvis and left groin. Numerous vascular calcifications are again noted.  IMPRESSION: 1. No definite acute abnormality of the bony pelvis or the right hip. 2. 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 This appears similar to the recent CT examination 03/05/2014. 3. Atherosclerosis.   Electronically Signed   By: Vinnie Langton M.D.   On: 03/14/2014 10:23      EKG Interpretation   Date/Time:  Sunday March 14 2014 08:34:39 EDT Ventricular Rate:  99 PR Interval:    QRS Duration: 122 QT Interval:  362 QTC Calculation: 464 R Axis:   -18 Text Interpretation:  Atrial fibrillation with premature ventricular or  aberrantly conducted complexes Non-specific intra-ventricular conduction  delay Nonspecific ST and T wave abnormality Abnormal ECG since last  tracing no significant change Confirmed by Melida Northington  MD, Nolita Kutter ([id number]) on  03/14/2014 8:38:53 AM      MDM   Final diagnoses:  Weakness  Hip pain  Metastatic cancer    Malaise, associated with mild weakness, and anorexia. This is consistent with his diagnosis of metastatic cancer. He has been recently seen  and assessed by his oncologist and treatment plan has been initiated. Today,  There does not appear to be any new acute problems.  Nursing Notes Reviewed/ Care Coordinated Applicable Imaging Reviewed Interpretation of Laboratory Data incorporated into ED treatment  The patient appears reasonably screened and/or stabilized for discharge and I doubt any other medical condition or other [facility] requiring further screening, evaluation, or treatment in the ED at this time prior to discharge.  Plan: Home Medications- usual; Home Treatments- rest, encourage oral intake; return here if the  recommended treatment, does not improve the symptoms; Recommended follow up- PCP for check up in 3 days, Oncology prn   I personally performed the services described in this documentation, which was scribed in my presence. The recorded information has been reviewed and is accurate.      Richarda Blade, MD 03/14/14 [phone]

## 2014-03-15 ENCOUNTER — Other Ambulatory Visit: Payer: Self-pay | Admitting: *Deleted

## 2014-03-15 DIAGNOSIS — C182 Malignant neoplasm of ascending colon: Secondary | ICD-10-CM

## 2014-03-15 LAB — URINE CULTURE
Colony Count: NO GROWTH
Culture: NO GROWTH

## 2014-03-15 MED ORDER — CAPECITABINE 500 MG PO TABS: 625.0000 mg/m2 | ORAL_TABLET | Freq: Two times a day (BID) | ORAL | Status: AC

## 2014-03-15 NOTE — Progress Notes (Signed)
Wrong quantity initially ordered for patient.  28 tabs ordered when 56 needed to be ordered Per Dr Vista Mink, pt is to take 1000mg  BID x 14days, 1 week off.  Called and spoke to Crockett at [facility] to clarify.  SLJ

## 2014-03-16 ENCOUNTER — Other Ambulatory Visit: Payer: Self-pay | Admitting: *Deleted

## 2014-03-16 DIAGNOSIS — C182 Malignant neoplasm of ascending colon: Secondary | ICD-10-CM

## 2014-03-16 MED ORDER — ONDANSETRON 4 MG PO TBDP: 4.0000 mg | ORAL_TABLET | Freq: Three times a day (TID) | ORAL | Status: AC | PRN

## 2014-03-18 NOTE — Care Management Note (Signed)
Was asked to call patient's wife by S. Bethel, RN who had made a call back phone call and wife had c/o needing help with caring for the patient, as he has become a complete care patient She has no children, nor other family to help  Mrs Manigo states pt was placed on a new oral chemo drug, but that he is not able to swallow at all, so cannot take this. She says she has a hospital bed, but  pt cannot even turn himself, so it is very difficult to care for him alone. She had HH once before. She asks if there is anyway to get help in the home. They do not have Medicaid, only Medicare and a supplement. Explained that medicare will not py for assistance in the home, except for [facility], or Hospice when MD feels Mr Minus is appropriate for this.She questions a Hospice home, and she has had family members and friends go to the one in [place]. Explained that MD will have to decide when pt is appropriate for this, along with her input and wishes.  Explained that there are also agencies available who supply private duty aides/ nursing assistants if she is able to afford this. Suggested she call patient's oncologist and speak with the nurse there and discuss this problem, and patient's condition, especially as MD is not aware that pt is not able to swallow.  He may want to see patient. If so, she says she will have to take pt by EMS, and this will cost a fortune, so she hopes he will not have to be taken in. She agrees to call oncologist and discuss condition and situation with nurse there, for advice from them. States she appreciates the CM's call as it has given her direction- she felt that she did not know what to do before.

## 2014-03-26 DEATH — deceased

## 2014-03-30 ENCOUNTER — Other Ambulatory Visit: Payer: Medicare Other

## 2014-03-31 ENCOUNTER — Encounter: Payer: Self-pay | Admitting: *Deleted

## 2014-03-31 NOTE — Progress Notes (Signed)
Faxed refill request from [facility] for capecitibine faxed back with note that patient is deceased.   FAX [phone]

## 2014-07-23 ENCOUNTER — Other Ambulatory Visit: Payer: Self-pay | Admitting: *Deleted

## 2014-09-08 ENCOUNTER — Ambulatory Visit: Payer: Medicare Other | Admitting: Internal Medicine

## 2014-09-08 ENCOUNTER — Other Ambulatory Visit: Payer: Medicare Other
